# Patient Record
Sex: Female | Born: 1965 | Race: White | Hispanic: No | Marital: Married | Smoking: Never smoker
Health system: Southern US, Community
[De-identification: ages and names within clinical notes are randomized; demographics above are authoritative.]

## PROBLEM LIST (undated history)

## (undated) DIAGNOSIS — M1990 Unspecified osteoarthritis, unspecified site: Secondary | ICD-10-CM

## (undated) DIAGNOSIS — F419 Anxiety disorder, unspecified: Secondary | ICD-10-CM

## (undated) DIAGNOSIS — I10 Essential (primary) hypertension: Secondary | ICD-10-CM

## (undated) DIAGNOSIS — E785 Hyperlipidemia, unspecified: Secondary | ICD-10-CM

## (undated) DIAGNOSIS — I639 Cerebral infarction, unspecified: Secondary | ICD-10-CM

## (undated) DIAGNOSIS — E119 Type 2 diabetes mellitus without complications: Secondary | ICD-10-CM

## (undated) DIAGNOSIS — J45909 Unspecified asthma, uncomplicated: Secondary | ICD-10-CM

## (undated) DIAGNOSIS — T7840XA Allergy, unspecified, initial encounter: Secondary | ICD-10-CM

## (undated) DIAGNOSIS — F32A Depression, unspecified: Secondary | ICD-10-CM

## (undated) HISTORY — DX: Cerebral infarction, unspecified: I63.9

## (undated) HISTORY — DX: Allergy, unspecified, initial encounter: T78.40XA

## (undated) HISTORY — DX: Essential (primary) hypertension: I10

## (undated) HISTORY — PX: OTHER SURGICAL HISTORY: SHX169

## (undated) HISTORY — DX: Hyperlipidemia, unspecified: E78.5

## (undated) HISTORY — PX: WISDOM TOOTH EXTRACTION: SHX21

---
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
## 1998-02-25 ENCOUNTER — Other Ambulatory Visit: Admission: RE | Admit: 1998-02-25 | Discharge: 1998-02-25 | Payer: Self-pay | Admitting: Obstetrics & Gynecology

## 1998-03-13 ENCOUNTER — Ambulatory Visit (HOSPITAL_COMMUNITY): Admission: RE | Admit: 1998-03-13 | Discharge: 1998-03-13 | Payer: Self-pay | Admitting: Obstetrics and Gynecology

## 1998-03-22 ENCOUNTER — Encounter: Admission: RE | Admit: 1998-03-22 | Discharge: 1998-06-20 | Payer: Self-pay | Admitting: Obstetrics and Gynecology

## 1998-05-03 ENCOUNTER — Other Ambulatory Visit: Admission: RE | Admit: 1998-05-03 | Discharge: 1998-05-03 | Payer: Self-pay | Admitting: Obstetrics and Gynecology

## 1998-05-03 ENCOUNTER — Inpatient Hospital Stay (HOSPITAL_COMMUNITY): Admission: AD | Admit: 1998-05-03 | Discharge: 1998-05-03 | Payer: Self-pay | Admitting: Obstetrics and Gynecology

## 1998-05-13 ENCOUNTER — Inpatient Hospital Stay (HOSPITAL_COMMUNITY): Admission: AD | Admit: 1998-05-13 | Discharge: 1998-05-16 | Payer: Self-pay | Admitting: Obstetrics and Gynecology

## 1998-05-17 ENCOUNTER — Encounter (HOSPITAL_COMMUNITY): Admission: RE | Admit: 1998-05-17 | Discharge: 1998-08-15 | Payer: Self-pay | Admitting: Obstetrics and Gynecology

## 1999-01-07 ENCOUNTER — Other Ambulatory Visit: Admission: RE | Admit: 1999-01-07 | Discharge: 1999-01-07 | Payer: Self-pay | Admitting: Obstetrics and Gynecology

## 1999-12-30 ENCOUNTER — Other Ambulatory Visit: Admission: RE | Admit: 1999-12-30 | Discharge: 1999-12-30 | Payer: Self-pay | Admitting: Obstetrics and Gynecology

## 2001-04-13 ENCOUNTER — Other Ambulatory Visit: Admission: RE | Admit: 2001-04-13 | Discharge: 2001-04-13 | Payer: Self-pay | Admitting: Obstetrics and Gynecology

## 2002-03-28 ENCOUNTER — Other Ambulatory Visit: Admission: RE | Admit: 2002-03-28 | Discharge: 2002-03-28 | Payer: Self-pay | Admitting: Obstetrics and Gynecology

## 2002-05-22 ENCOUNTER — Ambulatory Visit (HOSPITAL_COMMUNITY): Admission: RE | Admit: 2002-05-22 | Discharge: 2002-05-22 | Payer: Self-pay | Admitting: Family Medicine

## 2002-05-22 ENCOUNTER — Encounter: Payer: Self-pay | Admitting: Family Medicine

## 2002-06-17 ENCOUNTER — Inpatient Hospital Stay (HOSPITAL_COMMUNITY): Admission: AD | Admit: 2002-06-17 | Discharge: 2002-06-17 | Payer: Self-pay | Admitting: Obstetrics and Gynecology

## 2002-07-14 ENCOUNTER — Inpatient Hospital Stay (HOSPITAL_COMMUNITY): Admission: AD | Admit: 2002-07-14 | Discharge: 2002-07-14 | Payer: Self-pay | Admitting: Obstetrics and Gynecology

## 2005-02-03 ENCOUNTER — Other Ambulatory Visit: Admission: RE | Admit: 2005-02-03 | Discharge: 2005-02-03 | Payer: Self-pay | Admitting: Obstetrics and Gynecology

## 2006-02-03 ENCOUNTER — Other Ambulatory Visit: Admission: RE | Admit: 2006-02-03 | Discharge: 2006-02-03 | Payer: Self-pay | Admitting: Obstetrics and Gynecology

## 2006-02-09 ENCOUNTER — Ambulatory Visit (HOSPITAL_COMMUNITY): Admission: RE | Admit: 2006-02-09 | Discharge: 2006-02-09 | Payer: Self-pay | Admitting: Obstetrics and Gynecology

## 2007-03-29 ENCOUNTER — Encounter: Admission: RE | Admit: 2007-03-29 | Discharge: 2007-06-27 | Payer: Self-pay | Admitting: Obstetrics and Gynecology

## 2007-07-28 ENCOUNTER — Inpatient Hospital Stay (HOSPITAL_COMMUNITY): Admission: AD | Admit: 2007-07-28 | Discharge: 2007-07-28 | Payer: Self-pay | Admitting: Obstetrics and Gynecology

## 2007-09-16 ENCOUNTER — Inpatient Hospital Stay (HOSPITAL_COMMUNITY): Admission: AD | Admit: 2007-09-16 | Discharge: 2007-09-16 | Payer: Self-pay | Admitting: Obstetrics and Gynecology

## 2007-09-17 ENCOUNTER — Inpatient Hospital Stay (HOSPITAL_COMMUNITY): Admission: AD | Admit: 2007-09-17 | Discharge: 2007-09-17 | Payer: Self-pay | Admitting: Obstetrics and Gynecology

## 2007-09-19 ENCOUNTER — Inpatient Hospital Stay (HOSPITAL_COMMUNITY): Admission: AD | Admit: 2007-09-19 | Discharge: 2007-09-20 | Payer: Self-pay | Admitting: Obstetrics and Gynecology

## 2007-09-27 ENCOUNTER — Inpatient Hospital Stay (HOSPITAL_COMMUNITY): Admission: AD | Admit: 2007-09-27 | Discharge: 2007-09-29 | Payer: Self-pay | Admitting: Obstetrics and Gynecology

## 2007-10-03 ENCOUNTER — Inpatient Hospital Stay (HOSPITAL_COMMUNITY): Admission: AD | Admit: 2007-10-03 | Discharge: 2007-10-04 | Payer: Self-pay | Admitting: Obstetrics and Gynecology

## 2007-11-11 ENCOUNTER — Ambulatory Visit (HOSPITAL_COMMUNITY): Admission: RE | Admit: 2007-11-11 | Discharge: 2007-11-11 | Payer: Self-pay | Admitting: Obstetrics and Gynecology

## 2009-08-04 DIAGNOSIS — Z820 Family history of epilepsy and other diseases of the nervous system: Secondary | ICD-10-CM | POA: Insufficient documentation

## 2011-04-14 NOTE — H&P (Signed)
NAME:  Jessica Ponce, STROLLO NO.:  [id number]   MEDICAL RECORD NO.:  [id number]          PATIENT TYPE:  INP   LOCATION:  9173                          FACILITY:  [facility]   PHYSICIAN:  Osborn Coho, M.D.   DATE OF BIRTH:  [birth year]   DATE OF ADMISSION:  09/27/2007  DATE OF DISCHARGE:                              HISTORY & PHYSICAL   This is a 45 year old, gravida 2, para 1, at 38-4/7 weeks who presents  for evaluation secondary to high blood pressures in the office with a  blood pressure reading of 148/798.  The patient also reports a headache  that started yesterday.  She denies visual changes or abdominal pain,  does report occasional contractions.  A 24-hour urine and workup was  normal and Friday.  Pregnancy has been followed by Dr. Stefano Gaul and  remarkable for:  1. AMA.  2. History of infertility.  3. Gestational diabetes.  4. History of migraines.  5. History of asthma.  6. Obesity.  7. History of preeclampsia.   ALLERGIES:  None.   OB HISTORY:  Is remarkable for a vaginal delivery in 1999 of a female  infant at [redacted] weeks gestation, weighing 6 pounds 1 ounce. Remarkable for  preeclampsia.   MEDICAL HISTORY:  Is remarkable for:  1. Anemia in the past  2. Gestational diabetes with previous and current pregnancy.  3. History of preeclampsia with previous and current pregnancy.  4. History of preterm labor in the past pregnancy.  5. History of infertility with previous pregnancies.  6. Childhood varicella.  7. Asthma with rare use of inhaler.  8. History of migraines.   SURGICAL HISTORY:  Is remarkable for:  1. Appendectomy in 1984.  2. Repair  in 1985.   FAMILY HISTORY:  Is remarkable for father with hypertension, grandmother  with emphysema, grandmother with breast cancer, father with seizures.   GENETIC HISTORY:  Is remarkable for the patient's age of 2 and first  cousin with hemophilia.   SOCIAL HISTORY:  The patient is married to Lannette Donath who is  involved  and supportive.  She is of [place] faith.  She denies any alcohol,  tobacco or drug use.   PRENATAL LABORATORY DATA:  Hemoglobin 12.7, platelets 314.  Blood type O  positive, antibody screen negative.  Toxin negative, RPR negative,  rubella immune.  Hepatitis negative, HIV negative.  Cystic fibrosis  declined.   HISTORY OF CURRENT PREGNANCY:  The patient entered care at [redacted] weeks  gestation.  She had a 3-hour GTT done early that showed gestational  diabetes, and she was then sent to [facility] for  diabetic teaching.  She declined amniocentesis for genetics. She had an  ultrasound at 14 weeks for dates, and it was normal.  She was begun on  insulin at 14 weeks, and she was treated for asthma at 15 weeks. She had  ultrasound at 19 weeks which was  normal. Insulin was increased several  times throughout the pregnancy due to elevated blood sugars, and she had  a growth ultrasound and 29 weeks showing 79th  percentile growth. At that  point, she was on 12 units of NPH insulin in the morning, 6 units at  dinner, and 40 units at night. She began weekly NSTs at 30 weeks.  She  was seen for preterm contractions at 30 weeks.  She had a positive group  B strep at that time.  She had a UTI at 31 weeks and was treated for  that.  She had an asthma flare and 34 weeks and had to use her inhaler  and Singulair more frequently. She continued to have erratic blood  sugars, and insulin was increased as indicated, and she began having  some elevated blood pressures at 35 weeks and then presented today with  blood pressure elevation.   OBJECTIVE:  VITAL SIGNS:  One reading of 165/104, other readings are  130s to 140s over 92-94. PIH labs were all within normal limits.  Urine  was negative for protein.   Ultrasound in the office showed 8 pounds 5 ounces which is 85th  percentile growth. BPP was 8/8, and the AFI was 25 which was 95th  percentile.  NST shows a reactive fetal  heart rate with intermittent  contractions.  Cervix is 3 cm, 50%  effaced, and ballottable.  Her blood  sugar is pending.   ASSESSMENT:  1. Intrauterine pregnancy at 38-4/7 weeks.  2. Pregnancy-induced hypertension.  3. Type 2 diabetes on insulin.  4. Requests elective induction of labor.   PLAN:  1. Admit to birthing suites per Dr. Su Hilt after discussion of risks      and benefits of induction of labor.  2. Glucomander.  3. Pitocin augmentation of labor.  4. Further orders to follow.      Marie L. Williams, C.N.M.      Osborn Coho, M.D.  Electronically Signed    MLW/MEDQ  D:  09/27/2007  T:  09/27/2007  Job:  [id number]

## 2011-04-14 NOTE — Discharge Summary (Signed)
Jessica Ponce, Jessica Ponce                 ACCOUNT NO.:  [id number]   MEDICAL RECORD NO.:  [id number]          PATIENT TYPE:  INP   LOCATION:  9126                          FACILITY:  [facility]   PHYSICIAN:  Osborn Coho, M.D.   DATE OF BIRTH:  05/25/1966   DATE OF ADMISSION:  09/27/2007  DATE OF DISCHARGE:  09/29/2007                               DISCHARGE SUMMARY   ADMISSION DIAGNOSES:  1. Intrauterine pregnancy at 58 and 4/7th weeks.  2. Pregnancy-induced hypertension (PIH).  3. Type 2 diabetes requiring insulin.   DISCHARGE DIAGNOSES:  1. Intrauterine pregnancy at 76 and 4/7th weeks.  2. Pregnancy-induced hypertension (PIH).  3. Type 2 diabetes requiring insulin.  4. Status post spontaneous vaginal delivery of a female infant named      Jessica Ponce, Apgar's 8 and 9, weighing 8 pounds 1 ounce.  5. Bilateral periurethral abrasions.   HOSPITAL PROCEDURES:  1. Electronic fetal monitoring.  2. Pitocin augmentation of labor.  3. Epidural anesthesia.  4. Internal fetal monitors.  5. Spontaneous vaginal delivery.   HOSPITAL COURSE:  The patient was admitted in early labor at 3 cm with  pregnancy-induced hypertension with no evidence of preeclampsia.  She  also had insulin-dependent diabetes requiring increased insulin doses  towards the end of the pregnancy.  She had her labor augmented with  Pitocin, IUPC was placed, and amniotomy was performed.  Her labor  progressed quickly to vaginal delivery of a female infant named Jessica Ponce,  Apgar's 8 and 9, weighing 8 pounds 1 ounce.  EBL was 500 ml.  Periurethral abrasions were not repaired.  She was taken to the Mother-  Baby Floor where she did well.  On postpartum day #1, blood pressures  were 135 to 158/86 to 94.  Fasting blood sugar of 83, two-hour PC blood  sugar 176, and another postprandial blood sugar 160.  She was given  insulin accordingly.  Hemoglobin 10.6, and PIH labs were normal.  She  was given routine care.  On postpartum day #2, her  fasting blood sugar  was 99, two-hour postprandials were 230, 207, and 160.  She was  breastfeeding well.  Vital signs were stable.  Blood pressures were 127  to 134/71 to 85 without medication.  Chest was clear, heart was regular  rate and rhythm, fundus was firm, lochia small, perineum was healing,  extremities showed a trace to 1+ edema.  She also had a couple of hand  lesions on her right hand that were papular, raised, but not  erythematous, and she was switched to Glyburide for blood sugar control,  and she was deemed to have received full benefit of her hospital stay  and was discharged home.   DISCHARGE MEDICATIONS:  1. Motrin 600 mg p.o. q.6h p.r.n.  2. Tylox one to two p.o. q.4h p.r.n.  3. Glyburide 2.5 mg daily.   DISCHARGE LABORATORY DATA:  White blood cell count 17.2, hemoglobin  10.6, platelets 256.  Sodium 136, potassium 3.7, creatinine 0.64.  AST  26, ALT 14.  Uric acid 5.1.  LDH 173.   DISCHARGE INSTRUCTIONS:  Per [facility]  handout with the addition of checking  fasting blood sugar every day and two hours after eating alternating  between breakfast, lunch, and dinner once per day.   DISCHARGE FOLLOWUP:  In two weeks at [facility].   CONDITION AT DISCHARGE:  Good.      Marie L. Williams, C.N.M.      Osborn Coho, M.D.  Electronically Signed    MLW/MEDQ  D:  09/29/2007  T:  09/29/2007  Job:  [id number]

## 2011-09-08 LAB — COMPREHENSIVE METABOLIC PANEL
ALT: 32
AST: 21
Albumin: 2.8 — ABNORMAL LOW
Alkaline Phosphatase: 99
BUN: 10
CO2: 19
Calcium: 9.1
Chloride: 105
Creatinine, Ser: 0.84
GFR calc Af Amer: >60
GFR calc non Af Amer: >60
Glucose, Bld: 174 — ABNORMAL HIGH
Potassium: 3.9
Sodium: 138
Total Bilirubin: 0.4
Total Protein: 6.1

## 2011-09-08 LAB — URINALYSIS, ROUTINE W REFLEX MICROSCOPIC
Bilirubin Urine: NEGATIVE
Glucose, UA: NEGATIVE
Hgb urine dipstick: NEGATIVE
Ketones, ur: 15 — AB
Leukocytes, UA: NEGATIVE
Nitrite: NEGATIVE
Protein, ur: 30 — AB
Specific Gravity, Urine: >1.03 — ABNORMAL HIGH
Urobilinogen, UA: 0.2
pH: 6

## 2011-09-08 LAB — URINE MICROSCOPIC-ADD ON: RBC / HPF: NONE SEEN

## 2011-09-08 LAB — CBC
HCT: 32 — ABNORMAL LOW
Hemoglobin: 11.1 — ABNORMAL LOW
MCHC: 34.7
MCV: 84
Platelets: 420 — ABNORMAL HIGH
RBC: 3.81 — ABNORMAL LOW
RDW: 14.6 — ABNORMAL HIGH
WBC: 17.7 — ABNORMAL HIGH

## 2011-09-08 LAB — URIC ACID: Uric Acid, Serum: 5.3

## 2011-09-08 LAB — LACTATE DEHYDROGENASE: LDH: 192

## 2011-09-09 LAB — CBC
HCT: 30.2 — ABNORMAL LOW
HCT: 31.4 — ABNORMAL LOW
HCT: 33.2 — ABNORMAL LOW
Hemoglobin: 10.6 — ABNORMAL LOW
Hemoglobin: 10.9 — ABNORMAL LOW
Hemoglobin: 11.7 — ABNORMAL LOW
MCHC: 34.8
MCHC: 35.2
MCHC: 35.2
MCV: 83.5
MCV: 83.6
MCV: 83.9
Platelets: 256
Platelets: 270
Platelets: 271
RBC: 3.62 — ABNORMAL LOW
RBC: 3.75 — ABNORMAL LOW
RBC: 3.97
RDW: 14.6 — ABNORMAL HIGH
RDW: 14.8 — ABNORMAL HIGH
RDW: 14.9 — ABNORMAL HIGH
WBC: 13.3 — ABNORMAL HIGH
WBC: 14 — ABNORMAL HIGH
WBC: 17.2 — ABNORMAL HIGH

## 2011-09-09 LAB — URINALYSIS, ROUTINE W REFLEX MICROSCOPIC
Bilirubin Urine: NEGATIVE
Bilirubin Urine: NEGATIVE
Glucose, UA: NEGATIVE
Glucose, UA: NEGATIVE
Hgb urine dipstick: NEGATIVE
Hgb urine dipstick: NEGATIVE
Ketones, ur: NEGATIVE
Ketones, ur: NEGATIVE
Nitrite: NEGATIVE
Nitrite: NEGATIVE
Protein, ur: NEGATIVE
Protein, ur: NEGATIVE
Specific Gravity, Urine: 1.01
Specific Gravity, Urine: 1.015
Urobilinogen, UA: 0.2
Urobilinogen, UA: 0.2
pH: 5.5
pH: 6

## 2011-09-09 LAB — COMPREHENSIVE METABOLIC PANEL WITH GFR
ALT: 13
ALT: 14
ALT: 15
AST: 16
AST: 18
AST: 26
Albumin: 2 — ABNORMAL LOW
Albumin: 2.4 — ABNORMAL LOW
Albumin: 2.6 — ABNORMAL LOW
Alkaline Phosphatase: 103
Alkaline Phosphatase: 81
Alkaline Phosphatase: 97
BUN: 6
BUN: 6
BUN: 7
CO2: 20
CO2: 20
CO2: 22
Calcium: 8.6
Calcium: 8.7
Calcium: 9.6
Chloride: 107
Chloride: 108
Chloride: 108
Creatinine, Ser: 0.53
Creatinine, Ser: 0.54
Creatinine, Ser: 0.64
GFR calc non Af Amer: >60
GFR calc non Af Amer: >60
GFR calc non Af Amer: >60
Glucose, Bld: 114 — ABNORMAL HIGH
Glucose, Bld: 74
Glucose, Bld: 88
Potassium: 3.7
Potassium: 4
Potassium: 4
Sodium: 134 — ABNORMAL LOW
Sodium: 136
Sodium: 136
Total Bilirubin: 0.4
Total Bilirubin: 0.5
Total Bilirubin: 0.6
Total Protein: 4.9 — ABNORMAL LOW
Total Protein: 5.5 — ABNORMAL LOW
Total Protein: 6

## 2011-09-09 LAB — CREATININE CLEARANCE, URINE, 24 HOUR
Collection Interval-CRCL: 24
Creatinine Clearance: 153 — ABNORMAL HIGH
Creatinine, 24H Ur: 1167
Creatinine, Urine: 103.7
Creatinine: 0.53
Urine Total Volume-CRCL: 1125

## 2011-09-09 LAB — PROTEIN, URINE, 24 HOUR
Collection Interval-UPROT: 24
Protein, 24H Urine: 146 — ABNORMAL HIGH
Protein, Urine: 13
Urine Total Volume-UPROT: 1125

## 2011-09-09 LAB — RPR: RPR Ser Ql: NONREACTIVE

## 2011-09-09 LAB — LACTATE DEHYDROGENASE
LDH: 117
LDH: 124
LDH: 173

## 2011-09-09 LAB — URIC ACID
Uric Acid, Serum: 4.7
Uric Acid, Serum: 5
Uric Acid, Serum: 5.1

## 2011-09-09 LAB — GLUCOSE, RANDOM: Glucose, Bld: 150 — ABNORMAL HIGH

## 2011-09-11 LAB — URINALYSIS, ROUTINE W REFLEX MICROSCOPIC
Bilirubin Urine: NEGATIVE
Glucose, UA: NEGATIVE
Hgb urine dipstick: NEGATIVE
Ketones, ur: 15 — AB
Nitrite: NEGATIVE
Protein, ur: NEGATIVE
Specific Gravity, Urine: <1.005 — ABNORMAL LOW
Urobilinogen, UA: 0.2
pH: 6

## 2011-09-11 LAB — WET PREP, GENITAL
Clue Cells Wet Prep HPF POC: NONE SEEN
Trich, Wet Prep: NONE SEEN
Yeast Wet Prep HPF POC: NONE SEEN

## 2011-09-11 LAB — GC/CHLAMYDIA PROBE AMP, GENITAL
Chlamydia, DNA Probe: NEGATIVE
GC Probe Amp, Genital: NEGATIVE

## 2011-09-11 LAB — FETAL FIBRONECTIN: Fetal Fibronectin: NEGATIVE

## 2012-07-20 ENCOUNTER — Ambulatory Visit: Payer: Managed Care, Other (non HMO) | Admitting: Family Medicine

## 2012-07-20 ENCOUNTER — Ambulatory Visit: Payer: Managed Care, Other (non HMO)

## 2012-07-20 VITALS — BP 143/84 | HR 86 | Temp 98.3°F | Resp 18 | Ht 64.5 in | Wt 206.0 lb

## 2012-07-20 DIAGNOSIS — J45909 Unspecified asthma, uncomplicated: Secondary | ICD-10-CM

## 2012-07-20 DIAGNOSIS — R05 Cough: Secondary | ICD-10-CM

## 2012-07-20 DIAGNOSIS — R059 Cough, unspecified: Secondary | ICD-10-CM

## 2012-07-20 DIAGNOSIS — J189 Pneumonia, unspecified organism: Secondary | ICD-10-CM

## 2012-07-20 LAB — POCT CBC
Granulocyte percent: 73.3 %G (ref 37–80)
HCT, POC: 37.8 % (ref 37.7–47.9)
Hemoglobin: 11.8 g/dL — AB (ref 12.2–16.2)
Lymph, poc: 3.6 — AB (ref 0.6–3.4)
MCH, POC: 28.6 pg (ref 27–31.2)
MCHC: 31.2 g/dL — AB (ref 31.8–35.4)
MCV: 91.7 fL (ref 80–97)
MID (cbc): 0.8 (ref 0–0.9)
MPV: 10.5 fL (ref 0–99.8)
POC Granulocyte: 12.1 — AB (ref 2–6.9)
POC LYMPH PERCENT: 21.8 %L (ref 10–50)
POC MID %: 4.9 %M (ref 0–12)
Platelet Count, POC: 288 10*3/uL (ref 142–424)
RBC: 4.12 M/uL (ref 4.04–5.48)
RDW, POC: 13.1 %
WBC: 16.5 10*3/uL — AB (ref 4.6–10.2)

## 2012-07-20 MED ORDER — IPRATROPIUM BROMIDE 0.02 % IN SOLN
0.5000 mg | Freq: Once | RESPIRATORY_TRACT | Status: AC
  Administered 2012-07-20: 0.5 mg via RESPIRATORY_TRACT

## 2012-07-20 MED ORDER — AZITHROMYCIN 500 MG PO TABS: 500.0000 mg | ORAL_TABLET | Freq: Every day | ORAL | Status: AC

## 2012-07-20 MED ORDER — CEFTRIAXONE SODIUM 1 G IJ SOLR
1.0000 g | Freq: Once | INTRAMUSCULAR | Status: AC
  Administered 2012-07-20: 1 g via INTRAMUSCULAR

## 2012-07-20 MED ORDER — ALBUTEROL SULFATE (2.5 MG/3ML) 0.083% IN NEBU
2.5000 mg | INHALATION_SOLUTION | Freq: Once | RESPIRATORY_TRACT | Status: AC
  Administered 2012-07-20: 2.5 mg via RESPIRATORY_TRACT

## 2012-07-20 MED ORDER — PREDNISONE 50 MG PO TABS: ORAL_TABLET | ORAL | Status: AC

## 2012-07-20 NOTE — Assessment & Plan Note (Signed)
Patient does have pneumonia. This is seen clinically as well as with radiological imaging. At this point with her elevated white blood cell count in history of asthma we'll treat for community-acquired pneumonia as well as covering for atypicals. Patient will have his shot of ceftriaxone 1 g today and do azithromycin for 3 days. Patient given a prescription for prednisone that she can start in the next 48 hours if she feels like she's not improving. I'm concerned though that patient may have some postinflammatory changes at this pneumonia so that is the reason that the prednisone was given. Patient will followup in one week time if not improving.

## 2012-07-20 NOTE — Progress Notes (Signed)
  Subjective:    Patient ID: Jessica Ponce, female    DOB: 1966/06/18, 46 y.o.   MRN: [id number]  HPI 46 year old female coming in with 4 week history of cough as well as some shortness of breath. Patient does have a past medical history significant for asthma. Patient has been using her albuterol inhaler from time to time but states that now for the course of last week it does not feel like her asthma. Patient states that her asthma has been well controlled but now has shortness of breath as well as a productive cough that is yellow phlegm. Patient denies any fevers or chills denies any records. Patient though states starting tonight she started to feel a little more not like herself. Patient denies any recent travel history denies any changes in medicine. Patient is supposed to be on Advair but is not taking it because her asthma has been well-controlled.  Past medical surgical and family history reviewed without any changes. Review of Systems As stated in history of present illness otherwise negative.    Objective:   Physical Exam Filed Vitals:   07/20/12 1850  BP: 143/84  Pulse: 86  Temp: 98.3 F (36.8 C)  Resp: 18   General: No apparent distress HEENT: Pupils are equal round reactive to light, extra ocular movements intact, patient does have some mild erythema of the posterior pharynx Cardiovascular: Regular rate and rhythm no murmur Respiratory: Patient does have some mild scattered wheezes otherwise unremarkable. Abdomen: Bowel sounds positive nontender nondistended Extremities: Patient neurovascularly intact no swelling of the extremities.  Lab Results  Component Value Date   WBC 16.5* 07/20/2012   HGB 11.8* 07/20/2012   HCT 37.8 07/20/2012   MCV 91.7 07/20/2012   PLT 420 DELTA CHECK NOTED* 10/03/2007   Patient had a chest x-ray ordered and interpreted by me today. Patient does have some patchy infiltrate seen in the right middle lobe. This is seen both in AP as well as the lateral  views. Otherwise cardiac silhouette is normal size diaphragms are minorly flattened.      Assessment & Plan:  Pneumonia

## 2012-07-20 NOTE — Patient Instructions (Signed)
Very nice to meet you. You do have what appears to be a pneumonia. We are going to give you a shot of antibiotic as well as daily;. In addition to this going to give you a prescription for prednisone to have on hand. You can fill this if you want. If he gets fever, chills, worsening shortness of breath please seek medical attention immediately. You probably should be seen again in one week to make sure you're doing better. If you were doing great than at least call and tell us you are doing well. Marland Kitchen

## 2012-07-23 NOTE — Progress Notes (Signed)
Xray read and patient discussed with Dr. Smith. Agree with assessment and plan of care per his note.   

## 2012-08-02 ENCOUNTER — Ambulatory Visit: Payer: Managed Care, Other (non HMO)

## 2012-08-02 ENCOUNTER — Ambulatory Visit (INDEPENDENT_AMBULATORY_CARE_PROVIDER_SITE_OTHER): Payer: Managed Care, Other (non HMO) | Admitting: Family Medicine

## 2012-08-02 ENCOUNTER — Ambulatory Visit (HOSPITAL_COMMUNITY)
Admission: RE | Admit: 2012-08-02 | Discharge: 2012-08-02 | Disposition: A | Discharge status: Home / Self Care | Payer: Managed Care, Other (non HMO) | Source: Ambulatory Visit | Attending: Family Medicine | Admitting: Family Medicine

## 2012-08-02 ENCOUNTER — Telehealth: Payer: Self-pay

## 2012-08-02 VITALS — HR 77 | Temp 98.5°F | Resp 16 | Ht 65.0 in | Wt 207.0 lb

## 2012-08-02 DIAGNOSIS — M7989 Other specified soft tissue disorders: Secondary | ICD-10-CM

## 2012-08-02 DIAGNOSIS — R079 Chest pain, unspecified: Secondary | ICD-10-CM

## 2012-08-02 DIAGNOSIS — R600 Localized edema: Secondary | ICD-10-CM

## 2012-08-02 DIAGNOSIS — R609 Edema, unspecified: Secondary | ICD-10-CM | POA: Insufficient documentation

## 2012-08-02 DIAGNOSIS — R0602 Shortness of breath: Secondary | ICD-10-CM | POA: Insufficient documentation

## 2012-08-02 DIAGNOSIS — J189 Pneumonia, unspecified organism: Secondary | ICD-10-CM

## 2012-08-02 DIAGNOSIS — R0789 Other chest pain: Secondary | ICD-10-CM | POA: Insufficient documentation

## 2012-08-02 DIAGNOSIS — M79609 Pain in unspecified limb: Secondary | ICD-10-CM

## 2012-08-02 DIAGNOSIS — R509 Fever, unspecified: Secondary | ICD-10-CM | POA: Insufficient documentation

## 2012-08-02 LAB — POCT CBC
Granulocyte percent: 63.1 %G (ref 37–80)
HCT, POC: 44.2 % (ref 37.7–47.9)
Hemoglobin: 14 g/dL (ref 12.2–16.2)
Lymph, poc: 3.5 — AB (ref 0.6–3.4)
MCH, POC: 28.7 pg (ref 27–31.2)
MCHC: 31.7 g/dL — AB (ref 31.8–35.4)
MCV: 90.8 fL (ref 80–97)
MID (cbc): 0.6 (ref 0–0.9)
MPV: 9.5 fL (ref 0–99.8)
POC Granulocyte: 7 — AB (ref 2–6.9)
POC LYMPH PERCENT: 31.2 %L (ref 10–50)
POC MID %: 5.7 %M (ref 0–12)
Platelet Count, POC: 327 10*3/uL (ref 142–424)
RBC: 4.87 M/uL (ref 4.04–5.48)
RDW, POC: 13.3 %
WBC: 11.1 10*3/uL — AB (ref 4.6–10.2)

## 2012-08-02 NOTE — Telephone Encounter (Signed)
Spoke with pt and advised Doppler was normal. She wanted to see if DrLlauenstein was still going to give her an ABX for her elevated white count. She also wanted to know if she should proceed with the CT chest? Please advise

## 2012-08-02 NOTE — Addendum Note (Signed)
Addended by: Thelma Barge D on: 08/02/2012 03:01 PM   Modules accepted: Orders

## 2012-08-02 NOTE — Telephone Encounter (Signed)
Call report: LE doppler NEGATIVE for DVT.  Patient was evaluated by Dr. Milus Glazier earlier today with chest pain. She was sent for CT angiogram of the chest to rule out PE. While there, she developed right lower extremity swelling and was advised to have a scan of her leg, which was ordered.  Please advise the patient that the scan of the leg was NEGATIVE for DVT.  I'm also including Dr. Milus Glazier on the message as an FYI.  This was copied from a message by Tilden Fossa that was put into another pt's chart in error. I have documented in the other chart that the message was put in in error.

## 2012-08-02 NOTE — Progress Notes (Signed)
*  PRELIMINARY RESULTS* Vascular Ultrasound Right lower extremity venous duplex has been completed.  Preliminary findings: no evidence of DVT or baker's cyst.  Called report to Mateo Flow at Dr. Loma Boston office.  Farrel Demark, RDMS, RVT  08/02/2012, 4:11 PM

## 2012-08-02 NOTE — Progress Notes (Signed)
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   She is here for a followup of pneumonia.  She was treated with zithromycin and ceftriaxone on 8/24 after a 4 week hx of coughing.  She never took the prednisone. She has right chest ache and some shortness of breath, but minimal cough.  She developed a fever last night and c/o fatigue as well PMHx:  H/o asthma  Sig Negs:  No leg pains, no acute pleuritic pains  Objective: NAD HEENT:  Unremarkable Neck: supple, no adenopathy Chest: few rales Heart:  Reg, no murmur  [facility] reading (PRIMARY) by  Dr. Milus Glazier. CXR: no infiltrate or cardiomegaly Results for orders placed in visit on 08/02/12  POCT CBC      Component Value Range   WBC 11.1 (*) 4.6 - 10.2 K/uL   Lymph, poc 3.5 (*) 0.6 - 3.4   POC LYMPH PERCENT 31.2  10 - 50 %L   MID (cbc) 0.6  0 - 0.9   POC MID % 5.7  0 - 12 %M   POC Granulocyte 7.0 (*) 2 - 6.9   Granulocyte percent 63.1  37 - 80 %G   RBC 4.87  4.04 - 5.48 M/uL   Hemoglobin 14.0  12.2 - 16.2 g/dL   HCT, POC 40.9  81.1 - 47.9 %   MCV 90.8  80 - 97 fL   MCH, POC 28.7  27 - 31.2 pg   MCHC 31.7 (*) 31.8 - 35.4 g/dL   RDW, POC 91.4     Platelet Count, POC 327  142 - 424 K/uL   MPV 9.5  0 - 99.8 fL   Assessment:  Atypical pulmonary symptoms that could be a pulmonary embolus, given the persistent elevated WBC yet normal CXR.  Plan: chest CT with contrast today.

## 2012-08-03 NOTE — Telephone Encounter (Signed)
Please call in levaquin 500 qd x 7 days

## 2012-08-04 MED ORDER — LEVOFLOXACIN 500 MG PO TABS: 500.0000 mg | ORAL_TABLET | Freq: Every day | ORAL | Status: AC

## 2012-08-04 NOTE — Telephone Encounter (Signed)
Patient advised this is called in for her, looks like she also has ? About a CT chest, she will call me about this.

## 2013-01-17 ENCOUNTER — Ambulatory Visit: Payer: Self-pay | Admitting: Obstetrics and Gynecology

## 2013-02-10 ENCOUNTER — Ambulatory Visit: Payer: Self-pay | Admitting: Obstetrics and Gynecology

## 2013-03-01 ENCOUNTER — Ambulatory Visit: Payer: Self-pay | Admitting: Obstetrics and Gynecology

## 2013-11-03 ENCOUNTER — Encounter (HOSPITAL_COMMUNITY): Payer: Self-pay | Admitting: Emergency Medicine

## 2013-11-03 ENCOUNTER — Emergency Department (HOSPITAL_COMMUNITY)
Admission: EM | Admit: 2013-11-03 | Discharge: 2013-11-03 | Disposition: A | Discharge status: Home / Self Care | Payer: Managed Care, Other (non HMO) | Source: Home / Self Care | Attending: Family Medicine | Admitting: Family Medicine

## 2013-11-03 ENCOUNTER — Emergency Department (INDEPENDENT_AMBULATORY_CARE_PROVIDER_SITE_OTHER): Payer: Managed Care, Other (non HMO)

## 2013-11-03 DIAGNOSIS — IMO0002 Reserved for concepts with insufficient information to code with codable children: Secondary | ICD-10-CM

## 2013-11-03 DIAGNOSIS — W268XXA Contact with other sharp object(s), not elsewhere classified, initial encounter: Secondary | ICD-10-CM

## 2013-11-03 DIAGNOSIS — S90852A Superficial foreign body, left foot, initial encounter: Secondary | ICD-10-CM

## 2013-11-03 HISTORY — DX: Unspecified asthma, uncomplicated: J45.909

## 2013-11-03 HISTORY — DX: Type 2 diabetes mellitus without complications: E11.9

## 2013-11-03 NOTE — ED Notes (Signed)
Reports having a piece of glass in left foot.  Accidentally stepped on a broken bulb.  Husband attempted to remove glass, was unsuccessful.

## 2013-11-03 NOTE — ED Provider Notes (Addendum)
CSN: [id number]     Arrival date & time 11/03/13  1121 History   First MD Initiated Contact with Patient 11/03/13 1150     Chief Complaint  Patient presents with  . Foreign Body   (Consider location/radiation/quality/duration/timing/severity/associated sxs/prior Treatment) Patient is a 47 y.o. female presenting with foreign body. The history is provided by the patient.  Foreign Body Location:  Skin (left foot.) Suspected object:  Glass Pain quality:  Sharp Pain severity:  Mild Duration:  2 hours Progression:  Unchanged Chronicity:  New Risk factors comment:  Steppedon broken xmas ornament on floor this am.   Past Medical History  Diagnosis Date  . Asthma   . Diabetes mellitus without complication    Past Surgical History  Procedure Laterality Date  . Appendectomy     History reviewed. No pertinent family history. History  Substance Use Topics  . Smoking status: Never Smoker   . Smokeless tobacco: Not on file  . Alcohol Use: No   OB History   Grav Para Term Preterm Abortions TAB SAB Ect Mult Living                 Review of Systems  Musculoskeletal: Negative for joint swelling.  Skin: Positive for wound.    Allergies  Review of patient's allergies indicates no known allergies.  Home Medications   Current Outpatient Rx  Name  Route  Sig  Dispense  Refill  . ALBUTEROL IN   Inhalation   Inhale into the lungs.         . Fluticasone-Salmeterol (ADVAIR HFA IN)   Inhalation   Inhale into the lungs.         Marland Kitchen loratadine (CLARITIN) 10 MG tablet   Oral   Take 10 mg by mouth daily.         . montelukast (SINGULAIR) 10 MG tablet   Oral   Take 10 mg by mouth at bedtime.          BP 157/94  Pulse 82  Temp(Src) 98.5 F (36.9 C) (Oral)  Resp 16  SpO2 98%  LMP 10/23/2013 Physical Exam  Nursing note and vitals reviewed. Constitutional: She is oriented to person, place, and time. She appears well-developed and well-nourished.  Musculoskeletal: She  exhibits tenderness.       Feet:  Neurological: She is alert and oriented to person, place, and time.  Skin: Skin is warm and dry.    ED Course  FOREIGN BODY REMOVAL Date/Time: 11/03/2013 1:18 PM Performed by: Linna Hoff Authorized by: Bradd Canary D Consent: Verbal consent obtained. Risks and benefits: risks, benefits and alternatives were discussed Consent given by: patient Body area: skin General location: lower extremity Location details: left foot Local anesthetic: lidocaine 2% without epinephrine Patient sedated: no Patient restrained: no Patient cooperative: yes Localization method: probed Removal mechanism: forceps Dressing: dressing applied Tendon involvement: none Depth: subcutaneous Complexity: simple 1 objects recovered. Objects recovered: glass frag. Post-procedure assessment: foreign body removed Patient tolerance: Patient tolerated the procedure well with no immediate complications.   (including critical care time) Labs Review Labs Reviewed - No data to display Imaging Review Dg Foot Complete Left  11/03/2013   CLINICAL DATA:  Evaluate for radiopaque foreign body  EXAM: LEFT FOOT - COMPLETE 3+ VIEW  COMPARISON:  None.  FINDINGS: There is no evidence of fracture or dislocation. There is no evidence of arthropathy or other focal bone abnormality. Soft tissues are unremarkable. There is no evidence of a radiopaque foreign body within  the soft tissues.  IMPRESSION: Negative.   Electronically Signed   By: Salome Holmes M.D.   On: 11/03/2013 12:37    EKG Interpretation    Date/Time:    Ventricular Rate:    PR Interval:    QRS Duration:   QT Interval:    QTC Calculation:   R Axis:     Text Interpretation:              MDM  X-rays reviewed and report per radiologist. F.b successfully removed from foot.     Linna Hoff, MD 11/03/13 1322  Linna Hoff, MD 11/03/13 1324

## 2014-08-10 ENCOUNTER — Emergency Department (HOSPITAL_COMMUNITY)
Admission: EM | Admit: 2014-08-10 | Discharge: 2014-08-10 | Disposition: A | Discharge status: Home / Self Care | Payer: 59 | Attending: Emergency Medicine | Admitting: Emergency Medicine

## 2014-08-10 ENCOUNTER — Encounter (HOSPITAL_COMMUNITY): Payer: Self-pay | Admitting: Emergency Medicine

## 2014-08-10 ENCOUNTER — Emergency Department (HOSPITAL_COMMUNITY): Payer: 59

## 2014-08-10 ENCOUNTER — Emergency Department (HOSPITAL_COMMUNITY)
Admission: EM | Admit: 2014-08-10 | Discharge: 2014-08-10 | Disposition: A | Discharge status: Other Acute Inpatient Hospital | Payer: 59 | Source: Home / Self Care | Attending: Emergency Medicine | Admitting: Emergency Medicine

## 2014-08-10 DIAGNOSIS — Z79899 Other long term (current) drug therapy: Secondary | ICD-10-CM | POA: Diagnosis not present

## 2014-08-10 DIAGNOSIS — E119 Type 2 diabetes mellitus without complications: Secondary | ICD-10-CM | POA: Diagnosis not present

## 2014-08-10 DIAGNOSIS — R079 Chest pain, unspecified: Secondary | ICD-10-CM | POA: Diagnosis present

## 2014-08-10 DIAGNOSIS — J45909 Unspecified asthma, uncomplicated: Secondary | ICD-10-CM | POA: Diagnosis not present

## 2014-08-10 DIAGNOSIS — I10 Essential (primary) hypertension: Secondary | ICD-10-CM

## 2014-08-10 DIAGNOSIS — E669 Obesity, unspecified: Secondary | ICD-10-CM | POA: Insufficient documentation

## 2014-08-10 LAB — CBC
HCT: 38.2 % (ref 36.0–46.0)
Hemoglobin: 13.3 g/dL (ref 12.0–15.0)
MCH: 30 pg (ref 26.0–34.0)
MCHC: 34.8 g/dL (ref 30.0–36.0)
MCV: 86 fL (ref 78.0–100.0)
Platelets: 277 10*3/uL (ref 150–400)
RBC: 4.44 MIL/uL (ref 3.87–5.11)
RDW: 12.3 % (ref 11.5–15.5)
WBC: 10.9 10*3/uL — ABNORMAL HIGH (ref 4.0–10.5)

## 2014-08-10 LAB — BASIC METABOLIC PANEL
Anion gap: 12 (ref 5–15)
BUN: 8 mg/dL (ref 6–23)
CO2: 24 mEq/L (ref 19–32)
Calcium: 8.9 mg/dL (ref 8.4–10.5)
Chloride: 104 mEq/L (ref 96–112)
Creatinine, Ser: 0.68 mg/dL (ref 0.50–1.10)
GFR calc Af Amer: >90 mL/min (ref >90)
GFR calc non Af Amer: >90 mL/min (ref >90)
Glucose, Bld: 150 mg/dL — ABNORMAL HIGH (ref 70–99)
Potassium: 4.1 mEq/L (ref 3.7–5.3)
Sodium: 140 mEq/L (ref 137–147)

## 2014-08-10 LAB — I-STAT TROPONIN, ED: Troponin i, poc: 0 ng/mL (ref 0.00–0.08)

## 2014-08-10 MED ORDER — NITROGLYCERIN 0.4 MG SL SUBL: 0.4000 mg | SUBLINGUAL_TABLET | SUBLINGUAL | Status: DC | PRN

## 2014-08-10 MED ORDER — ASPIRIN 81 MG PO CHEW
324.0000 mg | CHEWABLE_TABLET | Freq: Once | ORAL | Status: AC
  Administered 2014-08-10: 324 mg via ORAL

## 2014-08-10 MED ORDER — TRAMADOL HCL 50 MG PO TABS: 50.0000 mg | ORAL_TABLET | Freq: Four times a day (QID) | ORAL | Status: DC | PRN

## 2014-08-10 MED ORDER — ASPIRIN 81 MG PO CHEW
CHEWABLE_TABLET | ORAL | Status: AC
  Filled 2014-08-10: qty 4

## 2014-08-10 MED ORDER — SODIUM CHLORIDE 0.9 % IV SOLN
INTRAVENOUS | Status: DC
  Administered 2014-08-10: 19:00:00 via INTRAVENOUS

## 2014-08-10 MED ORDER — NITROGLYCERIN 0.4 MG SL SUBL
SUBLINGUAL_TABLET | SUBLINGUAL | Status: AC
  Filled 2014-08-10: qty 1

## 2014-08-10 NOTE — Discharge Instructions (Signed)
We have determined that your problem requires further evaluation in the emergency department.  We will take care of your transport there.  Once at the emergency department, you will be evaluated by a provider and they will order whatever treatment or tests they deem necessary.  We cannot guarantee that they will do any specific test or do any specific treatment.  ° °

## 2014-08-10 NOTE — ED Notes (Signed)
Pt is a transfer from urgent care. Pt started experiencing chest pain this afternoon while working. Pt went to urgent care, and was sent here. Pt given 1 nitro at urgent care, which dropped her BP to 86/56. Pt's BP now is 148/84. Pt also given 324 ASA.

## 2014-08-10 NOTE — ED Provider Notes (Signed)
Chief Complaint   Chest Pain   History of Present Illness    Jessica Ponce is a 48 year old female with diabetes uncontrolled high blood pressure who has had a history since 2 PM today of left pectoral chest pain without radiation. This is described as a dull ache rated 3/intensity. It began while she was working at home, but not doing any exertion, and she denies any injury to the chest. She felt somewhat lightheaded and fatigued, but denies any associated shortness of breath, nausea, or diaphoresis. The pain is not pleuritic. She denies fever, chills, or URI symptoms. She's had no coughing or wheezing. No palpitations, presyncope, or syncope. She denies any leg swelling, but she has had some pain in the right medial thigh. She denies any abdominal pain, nausea, or vomiting, but has had some diarrhea. She has a history of diabetes and high blood pressure as well as asthma. She denies any history of hypercholesterolemia, cigarette smoking, or family history of heart disease. She herself has no personal history of heart disease has never had any pain like this before.  Review of Systems    Other than noted above, the patient denies any of the following symptoms. Systemic:  No fever or chills. Pulmonary:  No cough, wheezing, shortness of breath, sputum production, hemoptysis. Cardiac:  No palpitations, rapid heartbeat, dizziness, presyncope or syncope. GI:  No abdominal pain, heartburn, nausea, or vomiting. Ext:  No leg pain or swelling.  Bland    Past medical history, family history, social history, meds, and allergies were reviewed. She is intolerant to codeine. She takes no medications currently. She has a history of diabetes, hypertension, and asthma.  Physical Exam     Vital signs:  BP 188/129  Pulse 74  Temp(Src) 99.2 F (37.3 C) (Oral)  SpO2 99% Gen:  Alert, oriented, in no distress, skin warm and dry. ENT:  Mucous membranes moist, pharynx clear. Neck:  Supple, no adenopathy or  tenderness.  No JVD. Lungs:  Clear to auscultation, no wheezes, rales or rhonchi.  No respiratory distress. Heart:  Regular rhythm.  No gallops, murmers, clicks or rubs. Chest:  There was some localized chest wall tenderness to palpation in the left pectoral area. Abdomen:  Soft, nontender, no organomegaly or mass.  Bowel sounds normal.  No pulsatile abdominal mass or bruit. Ext:  No edema.  No calf tenderness and Homann's sign negative.  Pulses full and equal. She has some tenderness to palpation over the right medial thigh. No palpable cord or erythema. Skin:  Warm and dry.  No rash.   EKG Results:  Date: 08/10/2014  Rate: 77  Rhythm: normal sinus rhythm  QRS Axis: normal  Intervals: normal  ST/T Wave abnormalities: normal  Conduction Disutrbances:none  Narrative Interpretation: Normal sinus rhythm, normal EKG.  Old EKG Reviewed: none available    Course in Urgent [facility]         She was begun on IV normal saline at 50 mL per hour, monitor, oxygen via nasal cannula, given nitroglycerin 0.4 mg sublingually and aspirin 325 mg by mouth.  Assessment     There were no encounter diagnoses.  Differential diagnosis is acute coronary syndrome, pulmonary embolism, ruptured aneurysm, pneumothorax, Boerhaave syndrome, pericarditis, musculoskeletal pain, or reflux esophagitis.   Plan     The patient was transferred to the ED via EMS in stable condition.  Medical Decision Making:  The patient is a 48 year old female with diabetes uncontrolled high blood pressure who has had a history since 2 PM today of left pectoral chest pain without radiation. This is described as a dull ache rated 3/intensity. It began while she was working at home, but not doing any exertion, and she denies any injury to the chest. She felt somewhat lightheaded and  fatigued, but denies any associated shortness of breath, nausea, or diaphoresis. The pain is not pleuritic. She denies fever, chills, or URI symptoms. She's had no coughing or wheezing. No palpitations, presyncope, or syncope. She denies any leg swelling, but she has had some pain in the right medial thigh. She denies any abdominal pain, nausea, or vomiting, but has had some diarrhea. She has a history of diabetes and high blood pressure as well as asthma. She denies any history of hypercholesterolemia, cigarette smoking, or family history of heart disease. She herself has no personal history of heart disease has never had any pain like this before. Her EKG shows normal sinus rhythm and otherwise within normal limits, however since she has a number of risk factors and her blood pressure is not controlled, she will need further evaluation in the emergency department.     Harden Mo, MD 08/10/14 [phone]

## 2014-08-10 NOTE — ED Notes (Signed)
Patient noted sh felt dizzy, BP recheck revealed SIGNIFICANT decrease in readings. lLay  Flat, MD advised, [facility] here to transport

## 2014-08-10 NOTE — ED Notes (Signed)
C/o onset of pain in her chest earlier today. Pain worse w direct palpation of chest wall. States pain onset at rest , hurts to breathe

## 2014-08-10 NOTE — Discharge Instructions (Signed)
You have been evaluated for your chest pain.  No specific diagnosis were made.  However, no obvious sign of heart attack or other acute emergent condition identified.  Please follow up with your doctor for further care.  Return if your condition worsen or if you have other concerns.    Chest Pain (Nonspecific) It is often hard to give a specific diagnosis for the cause of chest pain. There is always a chance that your pain could be related to something serious, such as a heart attack or a blood clot in the lungs. You need to follow up with your health care provider for further evaluation. CAUSES   Heartburn.  Pneumonia or bronchitis.  Anxiety or stress.  Inflammation around your heart (pericarditis) or lung (pleuritis or pleurisy).  A blood clot in the lung.  A collapsed lung (pneumothorax). It can develop suddenly on its own (spontaneous pneumothorax) or from trauma to the chest.  Shingles infection (herpes zoster virus). The chest wall is composed of bones, muscles, and cartilage. Any of these can be the source of the pain.  The bones can be bruised by injury.  The muscles or cartilage can be strained by coughing or overwork.  The cartilage can be affected by inflammation and become sore (costochondritis). DIAGNOSIS  Lab tests or other studies may be needed to find the cause of your pain. Your health care provider may have you take a test called an ambulatory electrocardiogram (ECG). An ECG records your heartbeat patterns over a 24-hour period. You may also have other tests, such as:  Transthoracic echocardiogram (TTE). During echocardiography, sound waves are used to evaluate how blood flows through your heart.  Transesophageal echocardiogram (TEE).  Cardiac monitoring. This allows your health care provider to monitor your heart rate and rhythm in real time.  Holter monitor. This is a portable device that records your heartbeat and can help diagnose heart arrhythmias. It allows  your health care provider to track your heart activity for several days, if needed.  Stress tests by exercise or by giving medicine that makes the heart beat faster. TREATMENT   Treatment depends on what may be causing your chest pain. Treatment may include:  Acid blockers for heartburn.  Anti-inflammatory medicine.  Pain medicine for inflammatory conditions.  Antibiotics if an infection is present.  You may be advised to change lifestyle habits. This includes stopping smoking and avoiding alcohol, caffeine, and chocolate.  You may be advised to keep your head raised (elevated) when sleeping. This reduces the chance of acid going backward from your stomach into your esophagus. Most of the time, nonspecific chest pain will improve within 2-3 days with rest and mild pain medicine.  HOME CARE INSTRUCTIONS   If antibiotics were prescribed, take them as directed. Finish them even if you start to feel better.  For the next few days, avoid physical activities that bring on chest pain. Continue physical activities as directed.  Do not use any tobacco products, including cigarettes, chewing tobacco, or electronic cigarettes.  Avoid drinking alcohol.  Only take medicine as directed by your health care provider.  Follow your health care provider's suggestions for further testing if your chest pain does not go away.  Keep any follow-up appointments you made. If you do not go to an appointment, you could develop lasting (chronic) problems with pain. If there is any problem keeping an appointment, call to reschedule. SEEK MEDICAL CARE IF:   Your chest pain does not go away, even after treatment.  You have a rash with blisters on your chest.  You have a fever. SEEK IMMEDIATE MEDICAL CARE IF:   You have increased chest pain or pain that spreads to your arm, neck, jaw, back, or abdomen.  You have shortness of breath.  You have an increasing cough, or you cough up blood.  You have  severe back or abdominal pain.  You feel nauseous or vomit.  You have severe weakness.  You faint.  You have chills. This is an emergency. Do not wait to see if the pain will go away. Get medical help at once. Call your local emergency services (911 in [place] Do not drive yourself to the hospital. MAKE SURE YOU:   Understand these instructions.  Will watch your condition.  Will get help right away if you are not doing well or get worse. Document Released: 08/26/2005 Document Revised: 11/21/2013 Document Reviewed: 06/21/2008 [facility] Patient Information 2015 [place], [place]. This information is not intended to replace advice given to you by your health care provider. Make sure you discuss any questions you have with your health care provider.

## 2014-08-10 NOTE — ED Provider Notes (Signed)
CSN: [id number]     Arrival date & time 08/10/14  1935 History   First MD Initiated Contact with Patient 08/10/14 2139     Chief Complaint  Patient presents with  . Chest Pain     (Consider location/radiation/quality/duration/timing/severity/associated sxs/prior Treatment) HPI 48 year old female with history of diabetes, asthma, hypertension who was sent here from urgent care for further evaluation of chest pain. Patient report waking up this morning feeling a bit tired and lightheadedness. She works as a number of andreport gradual onset of left-sided chest pain which started 7 hours ago while at work.  No heavy lifting or strenuous activity. Pain is described as a dull and achy sensation, 3/10, nonradiating, with associated lightheadedness, fatigue but no shortness of breath, diaphoresis, or nausea. Pain is nonpleuritic. No associated fever, chills, headache, productive cough, hemoptysis, abdominal pain, back pain, numbness, or rash. No urinary symptoms. No wheezing or coughing. She reports having mild intermittent tenderness to the right medial thigh for the past 2 months which her doctor is aware. He denies any localized leg swelling or calf pain. No prior history of PE or DVT, no recent surgery, prolonged bed rest, history of active cancer, hemoptysis. Denies any significant family history of premature cardiac death or any personal history of heart disease. She is a nonsmoker. Her pain has been persistent throughout the day even at rest. She was seen at urgent care for this complaint but was recommended to come to ER for further evaluation. She did receive a sublingual nitroglycerin and aspirin at the urgent care. Shortly after receiving the medication she fell very lightheadedness and her blood pressure drops down to 86/56. She lays flat for a short amount of time and her symptoms resolved. She denies any history of drug allergies.     Past Medical History  Diagnosis Date  . Asthma   .  Diabetes mellitus without complication    Past Surgical History  Procedure Laterality Date  . Appendectomy     History reviewed. No pertinent family history. History  Substance Use Topics  . Smoking status: Never Smoker   . Smokeless tobacco: Not on file  . Alcohol Use: No   OB History   Grav Para Term Preterm Abortions TAB SAB Ect Mult Living                 Review of Systems  All other systems reviewed and are negative.     Allergies  Review of patient's allergies indicates no known allergies.  Home Medications   Prior to Admission medications   Medication Sig Start Date End Date Taking? Authorizing Provider  albuterol (PROVENTIL HFA;VENTOLIN HFA) 108 (90 BASE) MCG/ACT inhaler Inhale 1 puff into the lungs every 6 (six) hours as needed for wheezing or shortness of breath.   Yes Historical Provider, MD  Azelaic Acid (FINACEA EX) Apply 1 application topically daily as needed. For dry skin   Yes Historical Provider, MD  Fluticasone-Salmeterol (ADVAIR) 250-50 MCG/DOSE AEPB Inhale 1 puff into the lungs 2 (two) times daily.   Yes Historical Provider, MD   BP 148/84  Temp(Src) 98.3 F (36.8 C) (Oral)  Resp 15  Ht 5\' 5"  (1.651 m)  Wt 202 lb (91.627 kg)  BMI 33.61 kg/m2  SpO2 96%  LMP 07/17/2014 Physical Exam  Nursing note and vitals reviewed. Constitutional: She appears well-developed and well-nourished. No distress.   Moderately obese Caucasian female she is to be in no acute distress  HENT:  Head: Atraumatic.  Eyes: Conjunctivae are  normal.  Neck: Neck supple. No JVD present.  Cardiovascular: Normal rate, regular rhythm and intact distal pulses.   Pulmonary/Chest: Effort normal and breath sounds normal. She exhibits tenderness (mild left upper chest wall tenderness to palpation without crepitus or emphysema. No overlying skin changes.).  Abdominal: Soft. There is no tenderness.  Musculoskeletal: She exhibits no edema.  Neurological: She is alert.  Skin: No rash  noted.  Psychiatric: She has a normal mood and affect.    ED Course  Procedures (including critical care time)  Patient here with atypical chest pain for ACS, reproducible on exam likely to be musculoskeletal. PERC negative.  She did have a transient drop in her blood pressure after receiving sublingual nitroglycerin however her blood pressure has normalized and is now mildly hypertensive in the ED. Given the fact that patient is a bit anxious, I suspect her elevated blood pressures related to anxiety due to the situation. Workup initiated. We'll recheck vital signs. Her EKG at this time is normal without any acute ischemic changes.  Her initial ECG at Urgent Care earlier today was normal as well.    11:10 PM Chest x-ray is without any acute finding. She is currently hemodynamically stable. I have discussed with Dr. Audie Pinto and our plan is to have pt f/u with PCP for further care.  Doubt acute emergent condition at this time.  Strict return precaution given.    Labs Review Labs Reviewed  CBC - Abnormal; Notable for the following:    WBC 10.9 (*)    All other components within normal limits  BASIC METABOLIC PANEL - Abnormal; Notable for the following:    Glucose, Bld 150 (*)    All other components within normal limits  Randolm Idol, ED    Imaging Review Dg Chest Port 1 View  08/10/2014   CLINICAL DATA:  Left-sided chest pain.  EXAM: PORTABLE CHEST - 1 VIEW  COMPARISON:  08/02/2012  FINDINGS: The cardiomediastinal silhouette is unremarkable.  There is no evidence of focal airspace disease, pulmonary edema, suspicious pulmonary nodule/mass, pleural effusion, or pneumothorax. No acute bony abnormalities are identified.  IMPRESSION: No active disease.   Electronically Signed   By: Hassan Rowan M.D.   On: 08/10/2014 22:07     EKG Interpretation None      Date: 08/10/2014  Rate: 69  Rhythm: normal sinus rhythm  QRS Axis: normal  Intervals: normal  ST/T Wave abnormalities: normal   Conduction Disutrbances: none  Narrative Interpretation:   Old EKG Reviewed: No significant changes noted     MDM   Final diagnoses:  Chest pain at rest    BP 139/93  Pulse 84  Temp(Src) 98.3 F (36.8 C) (Oral)  Resp 20  Ht 5\' 5"  (1.651 m)  Wt 202 lb (91.627 kg)  BMI 33.61 kg/m2  SpO2 97%  LMP 07/17/2014  I have reviewed nursing notes and vital signs. I personally reviewed the imaging tests through PACS system  I reviewed available ER/hospitalization records thought the EMR     Domenic Moras, [place] 08/10/14 2313

## 2014-08-11 NOTE — ED Provider Notes (Signed)
Medical screening examination/treatment/procedure(s) were performed by non-physician practitioner and as supervising physician I was immediately available for consultation/collaboration.   Dot Lanes, MD 08/11/14 1041

## 2014-08-14 ENCOUNTER — Other Ambulatory Visit: Payer: Self-pay | Admitting: Family Medicine

## 2014-08-14 DIAGNOSIS — R229 Localized swelling, mass and lump, unspecified: Secondary | ICD-10-CM

## 2014-08-14 DIAGNOSIS — Z789 Other specified health status: Secondary | ICD-10-CM

## 2014-08-21 ENCOUNTER — Ambulatory Visit
Admission: RE | Admit: 2014-08-21 | Discharge: 2014-08-21 | Disposition: A | Discharge status: Home / Self Care | Payer: 59 | Source: Ambulatory Visit | Attending: Family Medicine | Admitting: Family Medicine

## 2014-08-21 DIAGNOSIS — R229 Localized swelling, mass and lump, unspecified: Secondary | ICD-10-CM

## 2014-08-21 DIAGNOSIS — Z789 Other specified health status: Secondary | ICD-10-CM

## 2015-12-12 ENCOUNTER — Emergency Department (HOSPITAL_COMMUNITY): Payer: 59

## 2015-12-12 ENCOUNTER — Encounter (HOSPITAL_COMMUNITY): Payer: Self-pay | Admitting: Emergency Medicine

## 2015-12-12 ENCOUNTER — Emergency Department (HOSPITAL_COMMUNITY)
Admission: EM | Admit: 2015-12-12 | Discharge: 2015-12-13 | Disposition: A | Discharge status: Home / Self Care | Payer: 59 | Attending: Emergency Medicine | Admitting: Emergency Medicine

## 2015-12-12 DIAGNOSIS — Z79899 Other long term (current) drug therapy: Secondary | ICD-10-CM | POA: Diagnosis not present

## 2015-12-12 DIAGNOSIS — M79622 Pain in left upper arm: Secondary | ICD-10-CM | POA: Diagnosis not present

## 2015-12-12 DIAGNOSIS — R200 Anesthesia of skin: Secondary | ICD-10-CM | POA: Diagnosis present

## 2015-12-12 DIAGNOSIS — J45909 Unspecified asthma, uncomplicated: Secondary | ICD-10-CM | POA: Insufficient documentation

## 2015-12-12 DIAGNOSIS — E119 Type 2 diabetes mellitus without complications: Secondary | ICD-10-CM | POA: Insufficient documentation

## 2015-12-12 DIAGNOSIS — I10 Essential (primary) hypertension: Secondary | ICD-10-CM | POA: Insufficient documentation

## 2015-12-12 DIAGNOSIS — M79602 Pain in left arm: Secondary | ICD-10-CM

## 2015-12-12 LAB — URINALYSIS, ROUTINE W REFLEX MICROSCOPIC
Bilirubin Urine: NEGATIVE
Glucose, UA: NEGATIVE mg/dL
Hgb urine dipstick: NEGATIVE
Ketones, ur: NEGATIVE mg/dL
Leukocytes, UA: NEGATIVE
Nitrite: NEGATIVE
Protein, ur: NEGATIVE mg/dL
Specific Gravity, Urine: 1.006 (ref 1.005–1.030)
pH: 5.5 (ref 5.0–8.0)

## 2015-12-12 LAB — BASIC METABOLIC PANEL
Anion gap: 14 (ref 5–15)
BUN: 13 mg/dL (ref 6–20)
CO2: 22 mmol/L (ref 22–32)
Calcium: 9.7 mg/dL (ref 8.9–10.3)
Chloride: 103 mmol/L (ref 101–111)
Creatinine, Ser: 0.93 mg/dL (ref 0.44–1.00)
GFR calc Af Amer: >60 mL/min (ref >60)
GFR calc non Af Amer: >60 mL/min (ref >60)
Glucose, Bld: 121 mg/dL — ABNORMAL HIGH (ref 65–99)
Potassium: 4 mmol/L (ref 3.5–5.1)
Sodium: 139 mmol/L (ref 135–145)

## 2015-12-12 LAB — CBC
HCT: 43.3 % (ref 36.0–46.0)
Hemoglobin: 14.5 g/dL (ref 12.0–15.0)
MCH: 29.7 pg (ref 26.0–34.0)
MCHC: 33.5 g/dL (ref 30.0–36.0)
MCV: 88.5 fL (ref 78.0–100.0)
Platelets: 301 10*3/uL (ref 150–400)
RBC: 4.89 MIL/uL (ref 3.87–5.11)
RDW: 14.2 % (ref 11.5–15.5)
WBC: 10.6 10*3/uL — ABNORMAL HIGH (ref 4.0–10.5)

## 2015-12-12 LAB — PROTIME-INR
INR: 0.96 (ref 0.00–1.49)
Prothrombin Time: 13 seconds (ref 11.6–15.2)

## 2015-12-12 LAB — TROPONIN I: Troponin I: <0.03 ng/mL (ref <0.031)

## 2015-12-12 MED ORDER — LORAZEPAM 2 MG/ML IJ SOLN
1.0000 mg | Freq: Once | INTRAMUSCULAR | Status: AC
  Administered 2015-12-12: 1 mg via INTRAVENOUS
  Filled 2015-12-12: qty 1

## 2015-12-12 NOTE — ED Notes (Signed)
Spoke with md plunkett about this pt no code stroke. Ct ordered.

## 2015-12-12 NOTE — Discharge Instructions (Signed)
Continue your regular home medications. Please follow-up with neurology, call tomorrow morning to make an appointment. Return to the ED for new or worsening symptoms-- facial droop, worsening numbness, weakness, changes in speech, difficulty walking, visual disturbance, etc.

## 2015-12-12 NOTE — ED Provider Notes (Signed)
CSN: [id number]     Arrival date & time 12/12/15  1434 History   First MD Initiated Contact with Patient 12/12/15 2006     Chief Complaint  Patient presents with  . Numbness     (Consider location/radiation/quality/duration/timing/severity/associated sxs/prior Treatment) The history is provided by the patient and medical records.    50 year old female with history of asthma, diabetes, hypertension, presenting to the ED for left-sided facial numbness. Patient states symptoms began probably at 1300 today. She states her face feels as though she just underwent Novocain injection by her dentist. She states it is mostly localized to her left cheek and left upper and lower lips. She states her tongue feels normal. She is not had any trouble swallowing or handling her secretions. Patient states over the past few hours her symptoms have persisted, however now she has developed some discomfort in her left upper arm. She states this is mostly localized to her left shoulder and proximal upper arm. She denies numbness or weakness. no injury or trauma to arm or neck.  Patient states she has not exercised in 2 weeks so does not feel it is that.  She has not had any headache, dizziness, confusion, changes in speech, tinnitus, visual disturbance, difficulty ambulating, or facial droop. She's not currently on any type of anticoagulation. No history of TIA or stroke. She denies any chest pain or shortness of breath. Patient recently came off of all of her diabetes medications as she states her blood sugars now controlled with diet alone. PCP- Harlan Stains  Past Medical History  Diagnosis Date  . Asthma   . Diabetes mellitus without complication [facility])    Past Surgical History  Procedure Laterality Date  . Appendectomy     No family history on file. Social History  Substance Use Topics  . Smoking status: Never Smoker   . Smokeless tobacco: None  . Alcohol Use: No   OB History    No data available      Review of Systems  Musculoskeletal: Positive for arthralgias.  Neurological: Positive for numbness.  All other systems reviewed and are negative.     Allergies  Codeine  Home Medications   Prior to Admission medications   Medication Sig Start Date End Date Taking? Authorizing Provider  albuterol (PROVENTIL HFA;VENTOLIN HFA) 108 (90 BASE) MCG/ACT inhaler Inhale 1 puff into the lungs every 6 (six) hours as needed for wheezing or shortness of breath.   Yes Historical Provider, MD  amLODipine (NORVASC) 5 MG tablet Take 5 mg by mouth daily.   Yes Historical Provider, MD  losartan (COZAAR) 100 MG tablet Take 100 mg by mouth daily.   Yes Historical Provider, MD  traMADol (ULTRAM) 50 MG tablet Take 1 tablet (50 mg total) by mouth every 6 (six) hours as needed for moderate pain. Patient not taking: Reported on 12/12/2015 08/10/14   Domenic Moras, PA-C   BP 135/91 mmHg  Pulse 68  Temp(Src) 97.8 F (36.6 C) (Oral)  Resp 14  Ht 5\' 4"  (1.626 m)  Wt 91.627 kg  BMI 34.66 kg/m2  SpO2 100%  LMP 12/10/2015   Physical Exam  Constitutional: She is oriented to person, place, and time. She appears well-developed and well-nourished. No distress.  HENT:  Head: Normocephalic and atraumatic.  Mouth/Throat: Oropharynx is clear and moist.  Eyes: Conjunctivae and EOM are normal. Pupils are equal, round, and reactive to light.  EOMs intact, no nystagmus Pupils symmetric and reactive bilaterally  Neck: Normal range of motion. Neck  supple.  Cardiovascular: Normal rate, regular rhythm and normal heart sounds.   Pulmonary/Chest: Effort normal and breath sounds normal. No respiratory distress. She has no wheezes.  Abdominal: Soft. Bowel sounds are normal. There is no tenderness. There is no guarding.  Musculoskeletal: Normal range of motion. She exhibits no edema.  Neurological: She is alert and oriented to person, place, and time.  AAOx3, answering questions and following commands appropriately; equal  strength UE and LE bilaterally; CN grossly intact; moves all extremities appropriately without ataxia; decreased sensation to left side of face, predominantly left cheek and left upper and lower lip; normal tongue protrusion; normal speech; no facial droop; normal gait  Skin: Skin is warm and dry. She is not diaphoretic.  Psychiatric: She has a normal mood and affect.  Nursing note and vitals reviewed.   ED Course  Procedures (including critical care time) Labs Review Labs Reviewed  BASIC METABOLIC PANEL - Abnormal; Notable for the following:    Glucose, Bld 121 (*)    All other components within normal limits  CBC - Abnormal; Notable for the following:    WBC 10.6 (*)    All other components within normal limits  URINALYSIS, ROUTINE W REFLEX MICROSCOPIC (NOT AT [facility])  PROTIME-INR  TROPONIN I  CBG MONITORING, ED    Imaging Review Ct Head Wo Contrast  12/12/2015  CLINICAL DATA:  Acute onset left-sided facial and left upper extremity numbness. EXAM: CT HEAD WITHOUT CONTRAST TECHNIQUE: Contiguous axial images were obtained from the base of the skull through the vertex without intravenous contrast. COMPARISON:  None. FINDINGS: The ventricles are normal in size and configuration. There is no intracranial mass, hemorrhage, extra-axial fluid collection, or midline shift. The gray-white compartments appear normal. No acute infarct evident. The middle cerebral arteries show symmetric and normal attenuation bilaterally. The bony calvarium appears intact. The mastoid air cells are clear. No intraorbital lesions are identified. There are retention cysts in the right maxillary antrum. IMPRESSION: Retention cysts in right maxillary antrum. No intracranial mass, hemorrhage, or focal gray - white compartment lesions/acute appearing infarct. Electronically Signed   By: Lowella Grip III M.D.   On: 12/12/2015 16:12   Mr Brain Wo Contrast  12/12/2015  CLINICAL DATA:  Initial evaluation for left-sided  facial and upper extremity numbness. EXAM: MRI HEAD WITHOUT CONTRAST MRI CERVICAL SPINE WITHOUT CONTRAST TECHNIQUE: Multiplanar, multiecho pulse sequences of the brain and surrounding structures, and cervical spine, to include the craniocervical junction and cervicothoracic junction, were obtained without intravenous contrast. COMPARISON:  None. FINDINGS: MRI HEAD FINDINGS The CSF containing spaces are within normal limits for patient age. No focal parenchymal signal abnormality is identified. No mass lesion, midline shift, or extra-axial fluid collection. Ventricles are normal in size without evidence of hydrocephalus. No diffusion-weighted signal abnormality is identified to suggest acute intracranial infarct. Gray-white matter differentiation is maintained. Normal flow voids are seen within the intracranial vasculature. No intracranial hemorrhage identified. The cervicomedullary junction is normal. Pituitary gland is within normal limits. Pituitary stalk is midline. The globes and optic nerves demonstrate a normal appearance with normal signal intensity. The bone marrow signal intensity is normal. Calvarium is intact. Visualized upper cervical spine is within normal limits. Scalp soft tissues are unremarkable. Polypoid opacity present within the inferior right maxillary sinus. Paranasal sinuses are otherwise clear. No mastoid effusion. MRI CERVICAL SPINE FINDINGS Mild reversal of the normal cervical lordosis with apex at C5. Trace anterolisthesis of C4 on C5, with trace retrolisthesis of C5 on C6. Dens is  somewhat angulated posteriorly. There is secondary mild narrowing at the craniocervical junction without frank impingement upon the brainstem. Vertebral body heights maintained. No fracture. Signal intensity within the vertebral body bone marrow is somewhat heterogeneous without focal lesion. No marrow edema. Signal intensity within the cervical spinal cord is normal. No acute paraspinous soft tissue  abnormality. C2-C3: Broad-based posterior disc bulge with mild bilateral uncovertebral spurring. Left worse than right facet arthrosis. Resultant moderate left foraminal narrowing with more mild right foraminal stenosis. No canal stenosis. C3-C4: No significant disc bulge. Mild bilateral uncovertebral spurring. Left worse than right facet arthrosis. No significant canal or foraminal stenosis. C4-C5: Trace anterolisthesis of C4 on C5. Broad-based posterior disc bulge, a centric to the right. There is flattening with partial effacement of the ventral thecal sac with minimal flattening of the ventral cord. No cord signal changes. Superimposed bilateral uncovertebral hypertrophy and left worse than right facet arthrosis. Resultant moderate left with more mild right foraminal stenosis. Very mild canal narrowing. C5-C6: Trace retrolisthesis of C5 on C6. Diffuse disc bulge with bilateral uncovertebral spurring, left worse than right. Mild left-sided facet arthrosis. Left foraminal disc osteophyte complex results in moderate to severe left foraminal narrowing. There is moderate right foraminal stenosis as well. Posterior disc osteophyte flattens and partially effaces the ventral thecal sac results in mild canal stenosis. C6-C7: Circumferential disc bulge with mild bilateral uncovertebral hypertrophy. Mild ligamentum flavum thickening. Moderate to severe right foraminal narrowing with more moderate left foraminal stenosis. Mild canal stenosis. C7-T1: Posterior and left foraminal broad-based disc bulge with superimposed left uncovertebral spurring. This left foraminal disc osteophyte complex results in moderate left foraminal narrowing. No significant right foraminal or canal stenosis. Degenerative disc bulge present at T2-3 without significant stenosis. Remainder of the visualized upper thoracic spine within normal limits. IMPRESSION: MRI HEAD IMPRESSION: Normal brain MRI. MRI CERVICAL SPINE IMPRESSION: 1. No acute  abnormality within the cervical spine. 2. Reversal of the normal cervical lordosis with moderate multilevel degenerative spondylolysis as above. There is resultant moderate to severe left foraminal narrowing at C5-6, with more moderate left foraminal stenosis at C6-7, C7-T1 and C2-3. Moderate to severe right foraminal stenosis at C6-7, with more moderate right foraminal narrowing at C5-6. Mild canal stenosis at C4-5 through C6-7. Electronically Signed   By: Jeannine Boga M.D.   On: 12/12/2015 23:06   Mr Cervical Spine Wo Contrast  12/12/2015  CLINICAL DATA:  Initial evaluation for left-sided facial and upper extremity numbness. EXAM: MRI HEAD WITHOUT CONTRAST MRI CERVICAL SPINE WITHOUT CONTRAST TECHNIQUE: Multiplanar, multiecho pulse sequences of the brain and surrounding structures, and cervical spine, to include the craniocervical junction and cervicothoracic junction, were obtained without intravenous contrast. COMPARISON:  None. FINDINGS: MRI HEAD FINDINGS The CSF containing spaces are within normal limits for patient age. No focal parenchymal signal abnormality is identified. No mass lesion, midline shift, or extra-axial fluid collection. Ventricles are normal in size without evidence of hydrocephalus. No diffusion-weighted signal abnormality is identified to suggest acute intracranial infarct. Gray-white matter differentiation is maintained. Normal flow voids are seen within the intracranial vasculature. No intracranial hemorrhage identified. The cervicomedullary junction is normal. Pituitary gland is within normal limits. Pituitary stalk is midline. The globes and optic nerves demonstrate a normal appearance with normal signal intensity. The bone marrow signal intensity is normal. Calvarium is intact. Visualized upper cervical spine is within normal limits. Scalp soft tissues are unremarkable. Polypoid opacity present within the inferior right maxillary sinus. Paranasal sinuses are otherwise clear.  No mastoid  effusion. MRI CERVICAL SPINE FINDINGS Mild reversal of the normal cervical lordosis with apex at C5. Trace anterolisthesis of C4 on C5, with trace retrolisthesis of C5 on C6. Dens is somewhat angulated posteriorly. There is secondary mild narrowing at the craniocervical junction without frank impingement upon the brainstem. Vertebral body heights maintained. No fracture. Signal intensity within the vertebral body bone marrow is somewhat heterogeneous without focal lesion. No marrow edema. Signal intensity within the cervical spinal cord is normal. No acute paraspinous soft tissue abnormality. C2-C3: Broad-based posterior disc bulge with mild bilateral uncovertebral spurring. Left worse than right facet arthrosis. Resultant moderate left foraminal narrowing with more mild right foraminal stenosis. No canal stenosis. C3-C4: No significant disc bulge. Mild bilateral uncovertebral spurring. Left worse than right facet arthrosis. No significant canal or foraminal stenosis. C4-C5: Trace anterolisthesis of C4 on C5. Broad-based posterior disc bulge, a centric to the right. There is flattening with partial effacement of the ventral thecal sac with minimal flattening of the ventral cord. No cord signal changes. Superimposed bilateral uncovertebral hypertrophy and left worse than right facet arthrosis. Resultant moderate left with more mild right foraminal stenosis. Very mild canal narrowing. C5-C6: Trace retrolisthesis of C5 on C6. Diffuse disc bulge with bilateral uncovertebral spurring, left worse than right. Mild left-sided facet arthrosis. Left foraminal disc osteophyte complex results in moderate to severe left foraminal narrowing. There is moderate right foraminal stenosis as well. Posterior disc osteophyte flattens and partially effaces the ventral thecal sac results in mild canal stenosis. C6-C7: Circumferential disc bulge with mild bilateral uncovertebral hypertrophy. Mild ligamentum flavum thickening.  Moderate to severe right foraminal narrowing with more moderate left foraminal stenosis. Mild canal stenosis. C7-T1: Posterior and left foraminal broad-based disc bulge with superimposed left uncovertebral spurring. This left foraminal disc osteophyte complex results in moderate left foraminal narrowing. No significant right foraminal or canal stenosis. Degenerative disc bulge present at T2-3 without significant stenosis. Remainder of the visualized upper thoracic spine within normal limits. IMPRESSION: MRI HEAD IMPRESSION: Normal brain MRI. MRI CERVICAL SPINE IMPRESSION: 1. No acute abnormality within the cervical spine. 2. Reversal of the normal cervical lordosis with moderate multilevel degenerative spondylolysis as above. There is resultant moderate to severe left foraminal narrowing at C5-6, with more moderate left foraminal stenosis at C6-7, C7-T1 and C2-3. Moderate to severe right foraminal stenosis at C6-7, with more moderate right foraminal narrowing at C5-6. Mild canal stenosis at C4-5 through C6-7. Electronically Signed   By: Jeannine Boga M.D.   On: 12/12/2015 23:06   I have personally reviewed and evaluated these images and lab results as part of my medical decision-making.   EKG Interpretation None      MDM   Final diagnoses:  Left facial numbness  Left arm pain   50 year old female here with numbness to left side of face which began today at 1300. No history of same. On exam, patient does have decreased sensation of the left side of her face, predominantly cheek and left side of mouth. Neurologic exam is otherwise nonfocal.  She also has some left arm pain which is not necessarily reproducible on exam. She denies any recent injury or trauma to her arm or neck. She denies any chest pain or shortness of breath. Will add cardiac enzymes, will discuss case with neurology for imaging recommendations.  8:34 PM Case discussed with Dr. Janann Colonel-- recommends MRI brain and CS.  If  negative and remainder of work-up unremarkable, can follow-up as OP with neurology clinic.  11:44 PM  MRI brain negative.  MRI CS with multi-level degenerative changes as well as some left foraminal narrowing which may be source of her left arm symptoms.  Patient's neurologic exam remains unchanged.  Discussed MRI results with patient, she acknowledged understanding.  Will have patient follow-up with OP neurologist as recommended by Dr. Janann Colonel.  Discussed plan with patient, he/she acknowledged understanding and agreed with plan of care.  Return precautions given for new or worsening symptoms.  Larene Pickett, PA-C 12/13/15 [id number]  Daleen Bo, MD 12/13/15 [phone]

## 2015-12-12 NOTE — ED Notes (Signed)
Pt states 1 hour ago she started having numbness on the left side of her face that is now into her left arm. Pt has no weakness or other neuro deficits. Speech is clear. Pt denies any headache. Pt states she feels lightheaded.

## 2015-12-12 NOTE — ED Notes (Signed)
Patient transported to MRI 

## 2015-12-12 NOTE — ED Notes (Signed)
PA at bedside.

## 2015-12-17 ENCOUNTER — Ambulatory Visit (INDEPENDENT_AMBULATORY_CARE_PROVIDER_SITE_OTHER): Payer: 59 | Admitting: Neurology

## 2015-12-17 ENCOUNTER — Encounter: Payer: Self-pay | Admitting: Neurology

## 2015-12-17 VITALS — BP 134/85 | HR 70 | Ht 64.0 in | Wt 192.8 lb

## 2015-12-17 DIAGNOSIS — M79602 Pain in left arm: Secondary | ICD-10-CM | POA: Diagnosis not present

## 2015-12-17 DIAGNOSIS — M5010 Cervical disc disorder with radiculopathy, unspecified cervical region: Secondary | ICD-10-CM | POA: Diagnosis not present

## 2015-12-17 DIAGNOSIS — R208 Other disturbances of skin sensation: Secondary | ICD-10-CM | POA: Diagnosis not present

## 2015-12-17 DIAGNOSIS — R200 Anesthesia of skin: Secondary | ICD-10-CM

## 2015-12-17 MED ORDER — CYCLOBENZAPRINE HCL 10 MG PO TABS: 10.0000 mg | ORAL_TABLET | Freq: Three times a day (TID) | ORAL | Status: DC | PRN

## 2015-12-17 NOTE — Progress Notes (Signed)
[facility]    Provider:  Dr Jaynee Eagles Referring Provider: Harlan Stains, MD Primary Care Physician:  Vidal Schwalbe, MD  CC:  Left-sided numbness  HPI:  Jessica Ponce is a 50 y.o. female here as a referral from Dr. Dema Severin for Left-sided numbness. PMHx asthma, diabetes, HTN It started in the last year with some numbness in the face. Thursday at 1pm she acute onset symptoms, no inciting events, it felt like novacaine on the face, more on the left side of the face but also on the right side of the face, she had shoulder pain and radiation into the left arm. Felt weird. Symptoms have been ongoing since then. She has an ache in her left shoulder and left arm. The face is still numb, more on the left but also on the right. She denies weakness, no weakness in the hands or dropping objects. Her neck and shoulder are sore and the arm feels a dull ache. Nothing makes it better or worse. Symptoms are continuous. No burning, no tingling in the arm but the arm feels numb. There is tingling in the face. Continuous all day long. No facial droop, no weakness in the face, no dysarthria or aphasia, no dysphagia, no gait changes, no bowel or bladder changes. The pain is 3-4/10 all day long. No FHx of neuromuscular dz or neuropathy. No headache, vision changes, weakness or other focal neurologic symptoms. No inciting factors, no trauma or injury.   Reviewed notes, labs and imaging from outside physicians, which showed:  CT of the head 12/12/2015:   Ct showed No acute intracranial abnormalities including mass lesion or mass effect, hydrocephalus, extra-axial fluid collection, midline shift, hemorrhage, or acute infarction, large ischemic events (personally reviewed images)  MRI of the brain was normal (personally reviewed images). MRi of the c-spine most significant for moderate to severe left foraminal narrowing at C5-6,   BMP unrmarkable  Patient was seen in the ED 12/12/2015 for left facial  numbness and then left upper extremity proximal arm weakness. Ct was negative. Patient had dec sensation on the face.     Review of Systems: Patient complains of symptoms per HPI as well as the following symptoms: No CP, no SOB Pertinent negatives per HPI. All others negative.   Social History   Social History  . Marital Status: Married    Spouse Name: Legrand Como  . Number of Children: 2  . Years of Education: 16   Occupational History  . Not on file.   Social History Main Topics  . Smoking status: Never Smoker   . Smokeless tobacco: Not on file  . Alcohol Use: 0.0 oz/week    0 Standard drinks or equivalent per week     Comment: some wine  . Drug Use: No  . Sexual Activity: Not on file   Other Topics Concern  . Not on file   Social History Narrative   Lives with husband and 2 kids   Caffeine use: Drinks 2-3 cups coffee per day    Family History  Problem Relation Age of Onset  . Memory loss Mother     Past Medical History  Diagnosis Date  . Asthma   . Diabetes mellitus without complication ([place])   . Hypertension     Past Surgical History  Procedure Laterality Date  . Appendectomy  1985    Current Outpatient Prescriptions  Medication Sig Dispense Refill  . albuterol (PROVENTIL HFA;VENTOLIN HFA) 108 (90 BASE) MCG/ACT inhaler Inhale 1 puff into the lungs every  6 (six) hours as needed for wheezing or shortness of breath.    Marland Kitchen amLODipine (NORVASC) 5 MG tablet Take 5 mg by mouth daily.    Marland Kitchen losartan (COZAAR) 100 MG tablet Take 100 mg by mouth daily.    Glory Rosebush DELICA LANCETS [id number] MISC USE AS DIRECTED TO TEST BLOOD SUGARS DAILY IN VITRO 90 DAYS  2   No current facility-administered medications for this visit.    Allergies as of 12/17/2015 - Review Complete 12/17/2015  Allergen Reaction Noted  . Codeine  12/12/2015    Vitals: BP 134/85 mmHg  Pulse 70  Ht 5\' 4"  (1.626 m)  Wt 192 lb 12.8 oz (87.454 kg)  BMI 33.08 kg/m2  LMP 12/10/2015 Last Weight:  Wt  Readings from Last 1 Encounters:  12/17/15 192 lb 12.8 oz (87.454 kg)   Last Height:   Ht Readings from Last 1 Encounters:  12/17/15 5\' 4"  (1.626 m)   Physical exam: Exam: Gen: NAD, conversant, well nourised, obese, well groomed                     CV: RRR, no MRG. No Carotid Bruits. No peripheral edema, warm, nontender Eyes: Conjunctivae clear without exudates or hemorrhage Sore to palpation more in the upper arm.   Neuro: Detailed Neurologic Exam  Speech:    Speech is normal; fluent and spontaneous with normal comprehension.  Cognition:    The patient is oriented to person, place, and time;     recent and remote memory intact;     language fluent;     normal attention, concentration,     fund of knowledge Cranial Nerves: Decreased mostly left V3 distribution    The pupils are equal, round, and reactive to light. The fundi are normal and spontaneous venous pulsations are present. Visual fields are full to finger confrontation. Extraocular movements are intact. Trigeminal sensation is intact and the muscles of mastication are normal. The face is symmetric. The palate elevates in the midline. Hearing intact. Voice is normal. Shoulder shrug is normal. The tongue has normal motion without fasciculations.   Coordination:    Normal finger to nose and heel to shin.   Gait:    Heel-toe and tandem gait are normal.   Motor Observation:    No asymmetry, no atrophy, and no involuntary movements noted. Tone:    Normal muscle tone.    Posture:    Posture is normal. normal erect    Strength:    Strength is V/V in the upper and lower limbs.  Intact intrinsic muscles of the hands.      Sensation: Decreased in the left face and in the deltoid area of the left arm.      Reflex Exam:  DTR's:    Deep tendon reflexes in the upper and lower extremities are brisk bilaterally.   Toes:    The toes are downgoing bilaterally.   Clonus:    Clonus is absent.       Assessment/Plan:  50  year old with acute onset left facial numbness then left proximal arm pain without weakness. MRi of the brain normal, MRI c-spine most significant for moderate to severe left foraminal narrowing at C5-6. symptoms of upper arm and shoulder pain are consistent with the MRI findings of moderate to severe C5-C6 foraminal stenosis. Will order EMG/NCS.  Unclear etiology of left facial numbness, MR of the brain was normal, may be unrelated and coincidental.   CC: Dr. Phillis Knack  Jaynee Eagles, [facility] [address] [place], [place] [address]  Phone [phone] Fax [phone]

## 2015-12-17 NOTE — Patient Instructions (Signed)
Remember to drink plenty of fluid, eat healthy meals and do not skip any meals. Try to eat protein with a every meal and eat a healthy snack such as fruit or nuts in between meals. Try to keep a regular sleep-wake schedule and try to exercise daily, particularly in the form of walking, 20-30 minutes a day, if you can.   As far as your medications are concerned, I would like to suggest: Flexeril up to three times a day  As far as diagnostic testing: emg/ncs  I would like to see you back first avail emg/ncs, sooner if we need to. Please call us with any interim questions, concerns, problems, updates or refill requests.   Our phone number is [phone]. We also have an after hours call service for urgent matters and there is a physician on-call for urgent questions. For any emergencies you know to call 911 or go to the nearest emergency room

## 2015-12-19 ENCOUNTER — Ambulatory Visit (INDEPENDENT_AMBULATORY_CARE_PROVIDER_SITE_OTHER): Payer: Self-pay | Admitting: Neurology

## 2015-12-19 ENCOUNTER — Ambulatory Visit (INDEPENDENT_AMBULATORY_CARE_PROVIDER_SITE_OTHER): Payer: 59 | Admitting: Neurology

## 2015-12-19 DIAGNOSIS — M79602 Pain in left arm: Secondary | ICD-10-CM

## 2015-12-19 DIAGNOSIS — M50122 Cervical disc disorder at C5-C6 level with radiculopathy: Secondary | ICD-10-CM | POA: Diagnosis not present

## 2015-12-19 DIAGNOSIS — Z0289 Encounter for other administrative examinations: Secondary | ICD-10-CM

## 2015-12-19 DIAGNOSIS — M5010 Cervical disc disorder with radiculopathy, unspecified cervical region: Secondary | ICD-10-CM

## 2015-12-19 MED ORDER — GABAPENTIN 100 MG PO CAPS: 100.0000 mg | ORAL_CAPSULE | Freq: Three times a day (TID) | ORAL | Status: DC

## 2015-12-19 NOTE — Progress Notes (Signed)
See procedure note.

## 2015-12-19 NOTE — Procedures (Signed)
  [facility]    Provider:  Dr Jaynee Eagles Referring Provider: Harlan Stains, MD Primary Care Physician:  Vidal Schwalbe, MD  HPI:  Jessica Ponce is a 50 y.o. female here as a referral from Dr. Dema Severin for Left-sided numbness. It started in the last year with some numbness in the face. Thursday at 1pm she acute onset symptoms, no inciting events, it felt like novacaine on the face, more on the left side of the face but also on the right side of the face, she had shoulder pain and radiation into the left arm. Felt weird. Symptoms have been ongoing since then. She has an ache in her left shoulder and left arm. The face is still numb, more on the left but also on the right. She denies weakness, no weakness in the hands or dropping objects. Her neck and shoulder are sore and the arm feels a dull ache. Nothing makes it better or worse. Symptoms are continuous. No burning, no tingling in the arm but the arm feels numb. There is tingling in the face. Continuous all day long. No facial droop, no weakness in the face, no dysarthria or aphasia, no dysphagia, no gait changes, no bowel or bladder changes. The pain is 3-4/10 all day long. Pain in the arm is more proximal.   MRI CERVICAL SPINE IMPRESSION:  1. No acute abnormality within the cervical spine. 2. Reversal of the normal cervical lordosis with moderate multilevel degenerative spondylolysis as above. There is resultant moderate to severe left foraminal narrowing at C5-6, with more moderate left foraminal stenosis at C6-7, C7-T1 and C2-3. Moderate to severe right foraminal stenosis at C6-7, with more moderate right foraminal narrowing at C5-6. Mild canal stenosis at C4-5 through C6-7.  Summary: Nerve Conduction studies were performed on the bilateral upper extremities: ADM Ulnar and APB Median motor conductions were within normal limits with normal F wave latencies.  2nd-digit Median, 5th-digit Ulnar and Radial sensory conductions were  within normal limits.   EMG needle evaluation was performed on selected left upper extremity muscles: The left Deltoid, left Triceps, left Biceps,, left Pronator Teres, left First Dorsal Interoseous, left Opponens Pollicis, left Supraspinatus and Infraspinatus and left C6/C7/C8 paraspinal muscles were normal.  Conclusion: This is a normal study. No electrophysiologic evidence for median or ulnar neuropathy or cervical radiculopathy. However patient's symptoms of upper arm and shoulder pain are consistent with the MRI findings of moderate to severe C5-C6 foraminal stenosis and I would suspect her radiculopathy may not have been detected on this emg.  Georgia Dom, MD  [facility] [address] [place], [place] [address]  Phone [phone] Fax [phone]

## 2015-12-19 NOTE — Progress Notes (Signed)
  [facility]    Provider:  Dr Jaynee Eagles Referring Provider: Harlan Stains, MD Primary Care Physician:  Vidal Schwalbe, MD  HPI:  Jessica Ponce is a 50 y.o. female here as a referral from Dr. Dema Severin for Left-sided numbness. It started in the last year with some numbness in the face. Thursday at 1pm she acute onset symptoms, no inciting events, it felt like novacaine on the face, more on the left side of the face but also on the right side of the face, she had shoulder pain and radiation into the left arm. Felt weird. Symptoms have been ongoing since then. She has an ache in her left shoulder and left arm. The face is still numb, more on the left but also on the right. She denies weakness, no weakness in the hands or dropping objects. Her neck and shoulder are sore and the arm feels a dull ache. Nothing makes it better or worse. Symptoms are continuous. No burning, no tingling in the arm but the arm feels numb. There is tingling in the face. Continuous all day long. No facial droop, no weakness in the face, no dysarthria or aphasia, no dysphagia, no gait changes, no bowel or bladder changes. The pain is 3-4/10 all day long. Pain in the arm is more proximal.   MRI CERVICAL SPINE IMPRESSION:  1. No acute abnormality within the cervical spine. 2. Reversal of the normal cervical lordosis with moderate multilevel degenerative spondylolysis as above. There is resultant moderate to severe left foraminal narrowing at C5-6, with more moderate left foraminal stenosis at C6-7, C7-T1 and C2-3. Moderate to severe right foraminal stenosis at C6-7, with more moderate right foraminal narrowing at C5-6. Mild canal stenosis at C4-5 through C6-7.  Summary: Nerve Conduction studies were performed on the bilateral upper extremities: ADM Ulnar and APB Median motor conductions were within normal limits with normal F wave latencies.  2nd-digit Median, 5th-digit Ulnar and Radial sensory conductions were  within normal limits.   EMG needle evaluation was performed on selected left upper extremity muscles: The left Deltoid, left Triceps, left Biceps,, left Pronator Teres, left First Dorsal Interoseous, left Opponens Pollicis, left Supraspinatus and Infraspinatus and left C6/C7/C8 paraspinal muscles were normal.  Conclusion: This is a normal study. No electrophysiologic evidence for median or ulnar neuropathy or cervical radiculopathy. However patient's symptoms of upper arm and shoulder pain are consistent with the MRI findings of moderate to severe C5-C6 foraminal stenosis and I would suspect her radiculopathy may not have been detected on this emg.  Georgia Dom, MD  [facility] [address] [place], [place] [address]  Phone [phone] Fax [phone]

## 2015-12-25 DIAGNOSIS — R200 Anesthesia of skin: Secondary | ICD-10-CM | POA: Insufficient documentation

## 2015-12-25 DIAGNOSIS — M79602 Pain in left arm: Secondary | ICD-10-CM | POA: Insufficient documentation

## 2016-01-08 ENCOUNTER — Ambulatory Visit: Payer: 59 | Admitting: Physical Therapy

## 2016-01-10 ENCOUNTER — Ambulatory Visit: Payer: 59 | Admitting: Physical Therapy

## 2016-05-06 ENCOUNTER — Telehealth: Payer: Self-pay | Admitting: Neurology

## 2016-05-06 NOTE — Telephone Encounter (Signed)
Records faxed to [facility] [place] [facility]

## 2016-05-07 NOTE — Telephone Encounter (Signed)
Records sent to [facility] [place] [facility]

## 2016-07-31 DIAGNOSIS — I10 Essential (primary) hypertension: Secondary | ICD-10-CM | POA: Insufficient documentation

## 2017-02-02 IMAGING — MR MR HEAD W/O CM
8 of 11 series · 32 of 48 positions shown · non-contrast
Comparison: None.

CLINICAL DATA: Initial evaluation for left-sided facial and upper
extremity numbness.

EXAM:
MRI HEAD WITHOUT CONTRAST
MRI CERVICAL SPINE WITHOUT CONTRAST
TECHNIQUE: Multiplanar, multiecho pulse sequences of the brain and surrounding
structures, and cervical spine, to include the craniocervical
junction and cervicothoracic junction, were obtained without
intravenous contrast.

[Series 2: FLAIR · sagittal · 5.0mm · 0.47mm/px · 2 of 23 slices shown (1 of 2)]
[im 1/23]
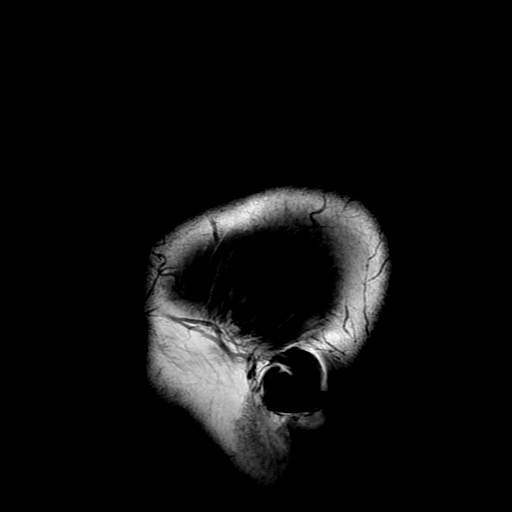
[im 23/23]
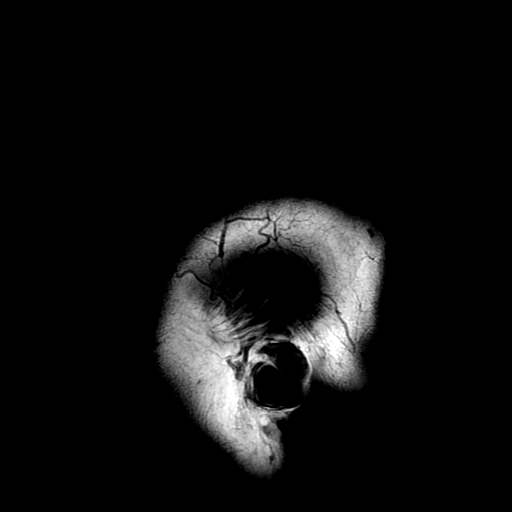

[Series 4: DWI · axial · 3.0mm · 0.94mm/px · z∈[-116,+20]mm · 9 of 94 slices shown (1 of 4)]
[im 1/94]
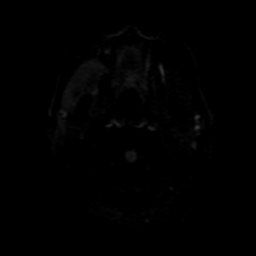
[im 12/94]
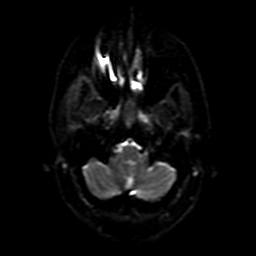
[im 24/94]
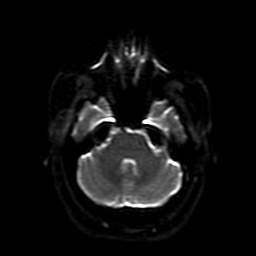
[im 35/94]
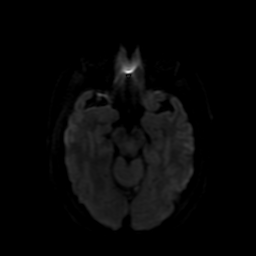
[im 47/94]
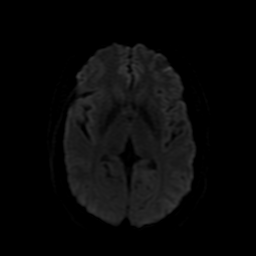
[im 59/94]
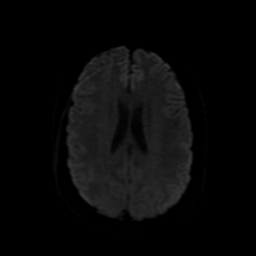
[im 70/94]
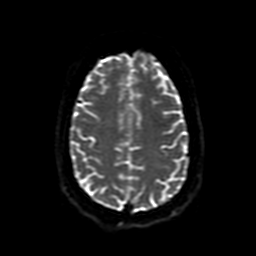
[im 82/94]
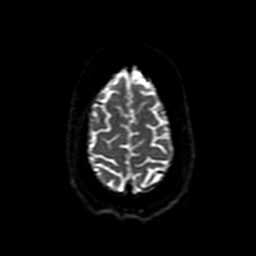
[im 94/94]
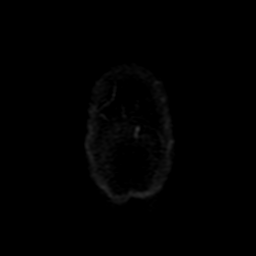

[Series 5: DWI · coronal · 4.0mm · 0.94mm/px · 7 of 70 slices shown (2 of 4)]
[im 1/70]
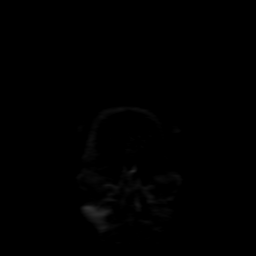
[im 12/70]
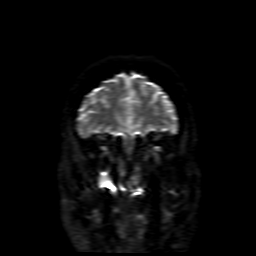
[im 24/70]
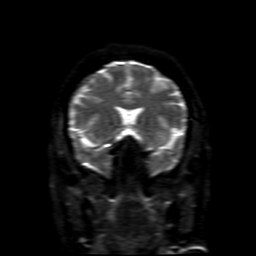
[im 35/70]
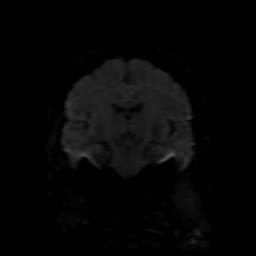
[im 47/70]
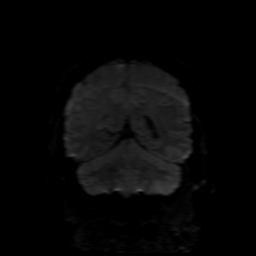
[im 58/70]
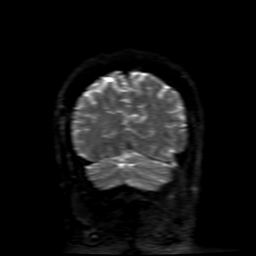
[im 70/70]
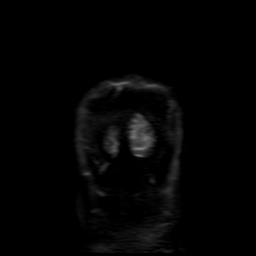

[Series 6: T2 · axial · 5.0mm · 0.47mm/px · z∈[-122,+14]mm · 2 of 24 slices shown (1 of 2)]
[im 1/24]
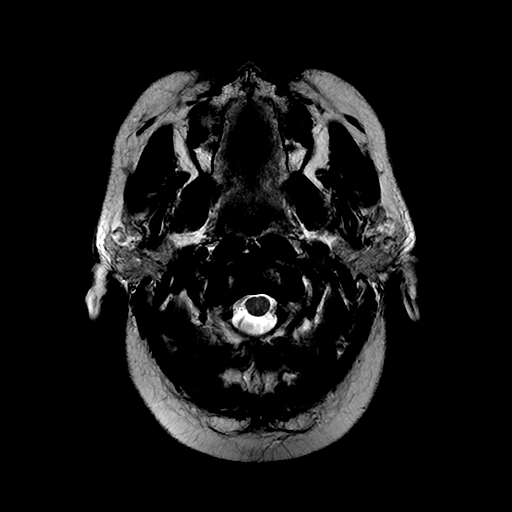
[im 24/24]
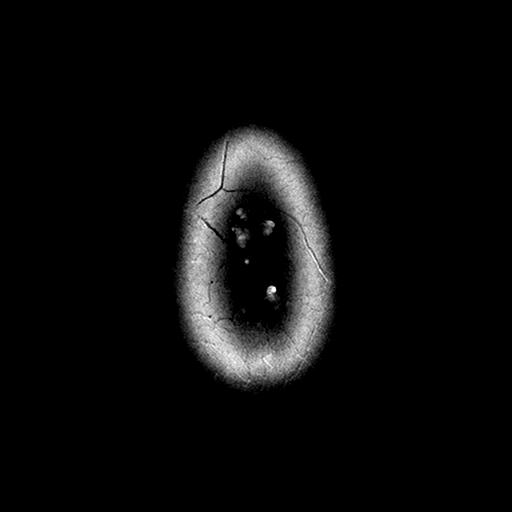

[Series 7: FLAIR · axial · 5.0mm · 0.47mm/px · z∈[-122,+14]mm · 2 of 24 slices shown (2 of 2)]
[im 1/24]
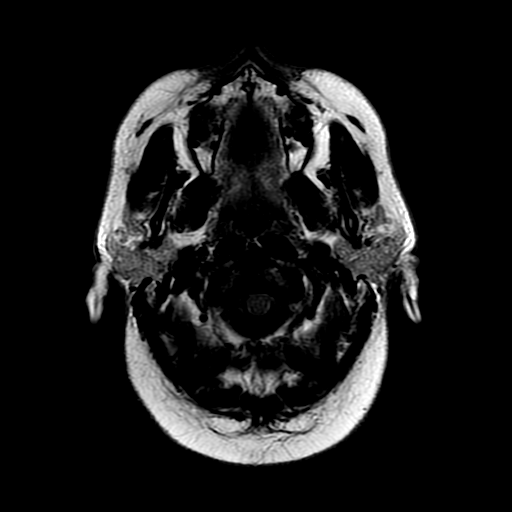
[im 24/24]
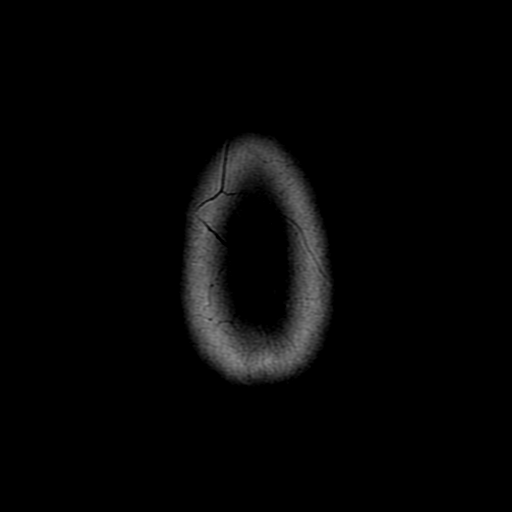

[Series 10: T2 · coronal · 5.0mm · 0.47mm/px · 3 of 29 slices shown (2 of 2)]
[im 1/29]
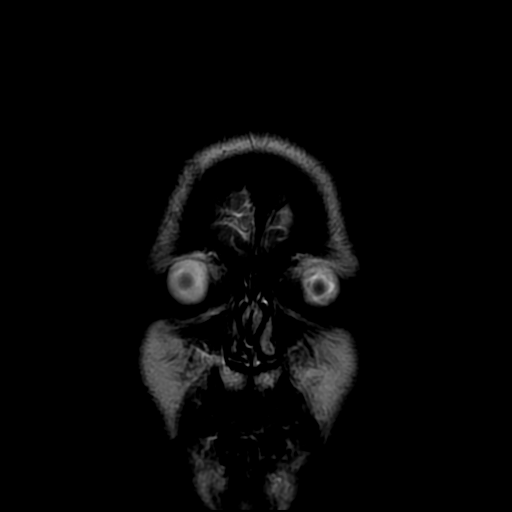
[im 15/29]
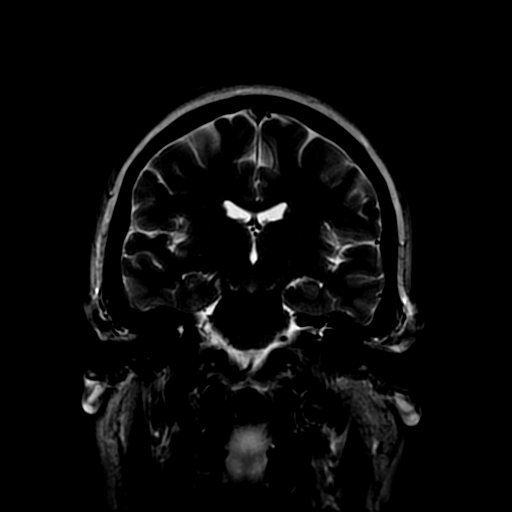
[im 29/29]
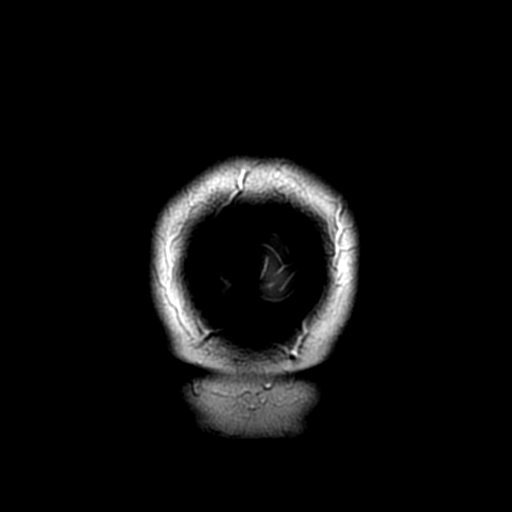

[Series 400: DWI · axial · 3.0mm · 0.94mm/px · z∈[-116,+20]mm · 4 of 47 slices shown (3 of 4)]
[im 1/47]
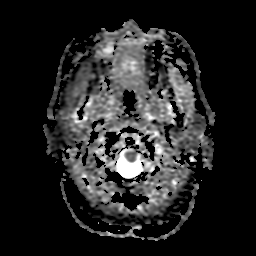
[im 16/47]
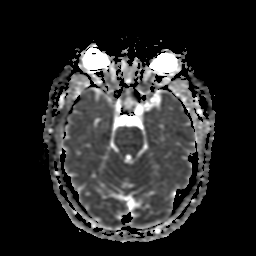
[im 31/47]
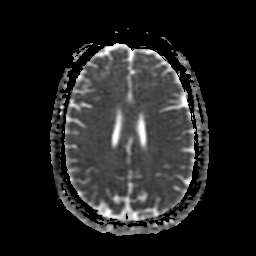
[im 47/47]
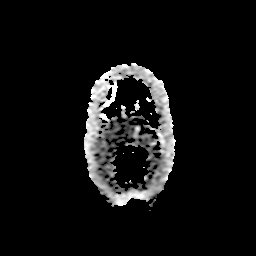

[Series 500: DWI · coronal · 4.0mm · 0.94mm/px · 3 of 35 slices shown (4 of 4)]
[im 1/35]
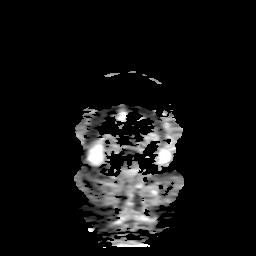
[im 18/35]
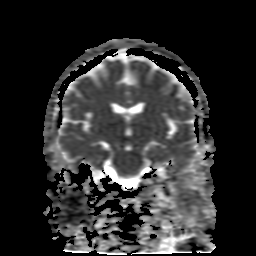
[im 35/35]
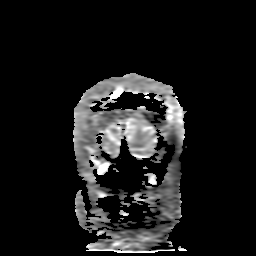

[32 of 48 positions shown; findings below may reference images not displayed]

FINDINGS: MRI HEAD FINDINGS

The CSF containing spaces are within normal limits for patient age.
No focal parenchymal signal abnormality is identified. No mass
lesion, midline shift, or extra-axial fluid collection. Ventricles
are normal in size without evidence of hydrocephalus.

No diffusion-weighted signal abnormality is identified to suggest
acute intracranial infarct. Gray-white matter differentiation is
maintained. Normal flow voids are seen within the intracranial
vasculature. No intracranial hemorrhage identified.

The cervicomedullary junction is normal. Pituitary gland is within
normal limits. Pituitary stalk is midline. The globes and optic
nerves demonstrate a normal appearance with normal signal intensity.

The bone marrow signal intensity is normal. Calvarium is intact.
Visualized upper cervical spine is within normal limits.

Scalp soft tissues are unremarkable.

Polypoid opacity present within the inferior right maxillary sinus.
Paranasal sinuses are otherwise clear. No mastoid effusion.

MRI CERVICAL SPINE FINDINGS

Mild reversal of the normal cervical lordosis with apex at C5. Trace
anterolisthesis of C4 on C5, with trace retrolisthesis of C5 on C6.
Dens is somewhat angulated posteriorly. There is secondary mild
narrowing at the craniocervical junction without frank impingement
upon the brainstem.

Vertebral body heights maintained. No fracture. Signal intensity
within the vertebral body bone marrow is somewhat heterogeneous
without focal lesion. No marrow edema.

Signal intensity within the cervical spinal cord is normal.

No acute paraspinous soft tissue abnormality.

C2-C3: Broad-based posterior disc bulge with mild bilateral
uncovertebral spurring. Left worse than right facet arthrosis.
Resultant moderate left foraminal narrowing with more mild right
foraminal stenosis. No canal stenosis.

C3-C4: No significant disc bulge. Mild bilateral uncovertebral
spurring. Left worse than right facet arthrosis. No significant
canal or foraminal stenosis.

C4-C5: Trace anterolisthesis of C4 on C5. Broad-based posterior disc
bulge, a centric to the right. There is flattening with partial
effacement of the ventral thecal sac with minimal flattening of the
ventral cord. No cord signal changes. Superimposed bilateral
uncovertebral hypertrophy and left worse than right facet arthrosis.
Resultant moderate left with more mild right foraminal stenosis.
Very mild canal narrowing.

C5-C6: Trace retrolisthesis of C5 on C6. Diffuse disc bulge with
bilateral uncovertebral spurring, left worse than right. Mild
left-sided facet arthrosis. Left foraminal disc osteophyte complex
results in moderate to severe left foraminal narrowing. There is
moderate right foraminal stenosis as well. Posterior disc osteophyte
flattens and partially effaces the ventral thecal sac results in
mild canal stenosis.

C6-C7: Circumferential disc bulge with mild bilateral uncovertebral
hypertrophy. Mild ligamentum flavum thickening. Moderate to severe
right foraminal narrowing with more moderate left foraminal
stenosis. Mild canal stenosis.

C7-T1: Posterior and left foraminal broad-based disc bulge with
superimposed left uncovertebral spurring. This left foraminal disc
osteophyte complex results in moderate left foraminal narrowing. No
significant right foraminal or canal stenosis.

Degenerative disc bulge present at T2-3 without significant
stenosis. Remainder of the visualized upper thoracic spine within
normal limits.
IMPRESSION: MRI HEAD IMPRESSION:

Normal brain MRI.

MRI CERVICAL SPINE IMPRESSION:

1. No acute abnormality within the cervical spine.
2. Reversal of the normal cervical lordosis with moderate multilevel
degenerative spondylolysis as above. There is resultant moderate to
severe left foraminal narrowing at C5-6, with more moderate left
foraminal stenosis at C6-7, C7-T1 and C2-3. Moderate to severe right
foraminal stenosis at C6-7, with more moderate right foraminal
narrowing at C5-6. Mild canal stenosis at C4-5 through C6-7.

## 2019-08-23 ENCOUNTER — Ambulatory Visit
Admission: EM | Admit: 2019-08-23 | Discharge: 2019-08-23 | Disposition: A | Discharge status: Home / Self Care | Payer: BC Managed Care – PPO

## 2019-08-23 DIAGNOSIS — I10 Essential (primary) hypertension: Secondary | ICD-10-CM

## 2019-08-23 DIAGNOSIS — Z760 Encounter for issue of repeat prescription: Secondary | ICD-10-CM | POA: Diagnosis not present

## 2019-08-23 DIAGNOSIS — H00011 Hordeolum externum right upper eyelid: Secondary | ICD-10-CM

## 2019-08-23 MED ORDER — ERYTHROMYCIN 5 MG/GM OP OINT: TOPICAL_OINTMENT | OPHTHALMIC | 0 refills | Status: DC

## 2019-08-23 MED ORDER — LOSARTAN POTASSIUM 100 MG PO TABS: 100.0000 mg | ORAL_TABLET | Freq: Every day | ORAL | 0 refills | Status: DC

## 2019-08-23 MED ORDER — AMLODIPINE BESYLATE 10 MG PO TABS: 10.0000 mg | ORAL_TABLET | Freq: Every day | ORAL | 0 refills | Status: DC

## 2019-08-23 NOTE — ED Triage Notes (Signed)
Pt c/o style to rt upper eye lid since Monday , redness and swelling noted

## 2019-08-23 NOTE — Discharge Instructions (Signed)
Use erythromycin as directed. Refilled blood pressure medications today. Information attached for PCP offices for follow up and further evaluation of high blood pressure.

## 2019-08-23 NOTE — ED Provider Notes (Signed)
[name]    CSN: [id number] Arrival date & time: 08/23/19  1727      History   Chief Complaint Chief Complaint  Patient presents with  . Stye    HPI Jessica Ponce is a 53 y.o. female.   53 yo female with hx of HTN present with redness and pain of her right upper eyelid x 3 days. The redness, pain, and swelling increased significantly yesterday. Some tenderness under right eye. Denies drainage or foreign body senstation. Denies changes in vision. Denies fever or URI symptoms.   She has not been on blood pressure medication since Nov of 2019. She had a change of insurance in Jan of 2020 and her PCP does not accept her new insurance. She has a BP monitor at home but has not checked it in months. Denies CP, SOB, one sided weakness. Denies HA, vision changes. Denies cough, orthopnea, or edema.      Past Medical History:  Diagnosis Date  . Asthma   . Diabetes mellitus without complication ([place])   . Hypertension     Patient Active Problem List   Diagnosis Date Noted  . Left arm pain 12/25/2015  . Left facial numbness 12/25/2015  . Pneumonia 07/20/2012    Past Surgical History:  Procedure Laterality Date  . APPENDECTOMY  1985    OB History   No obstetric history on file.      Home Medications    Prior to Admission medications   Medication Sig Start Date End Date Taking? Authorizing Provider  albuterol (PROVENTIL HFA;VENTOLIN HFA) 108 (90 BASE) MCG/ACT inhaler Inhale 1 puff into the lungs every 6 (six) hours as needed for wheezing or shortness of breath.    [provider]  amLODipine (NORVASC) 10 MG tablet Take 1 tablet (10 mg total) by mouth daily. 08/23/19   Tasia Catchings, Nargis Abrams V, PA-C  erythromycin ophthalmic ointment Place a 1/2 inch ribbon of ointment onto the upper eyelid 4 times a day. 08/23/19   Tasia Catchings, Keyshawn Hellwig V, PA-C  losartan (COZAAR) 100 MG tablet Take 1 tablet (100 mg total) by mouth daily. 08/23/19   Ok Edwards, PA-C    Family History Family History   Problem Relation Age of Onset  . Memory loss Mother     Social History Social History   Tobacco Use  . Smoking status: Never Smoker  . Smokeless tobacco: Never Used  Substance Use Topics  . Alcohol use: Yes    Alcohol/week: 0.0 standard drinks    Comment: some wine  . Drug use: No     Allergies   Codeine   Review of Systems Review of Systems  See HPI.    Physical Exam Triage Vital Signs ED Triage Vitals [08/23/19 1741]  Enc Vitals Group     BP (!) 204/143     Pulse Rate (!) 102     Resp 18     Temp 98.3 F (36.8 C)     Temp Source Oral     SpO2 95 %     Weight      Height      Head Circumference      Peak Flow      Pain Score 3     Pain Loc      Pain Edu?      Excl. in [place]?    No data found.  Updated Vital Signs BP (!) 204/143 (BP Location: Left Arm)   Pulse (!) 102   Temp 98.3 F (36.8  C) (Oral)   Resp 18   SpO2 95%   Physical Exam Constitutional:      General: She is not in acute distress.    Appearance: Normal appearance. She is not ill-appearing or toxic-appearing.  HENT:     Nose: Nose normal.     Mouth/Throat:     Mouth: Mucous membranes are moist.     Pharynx: Oropharynx is clear. No oropharyngeal exudate or posterior oropharyngeal erythema.  Eyes:     General:        Right eye: No discharge.        Left eye: No discharge.     Extraocular Movements: Extraocular movements intact.     Conjunctiva/sclera: Conjunctivae normal.     Pupils: Pupils are equal, round, and reactive to light.     Comments: Right eye: Swelling and erythema of upper lid. Hordeolum noted to upper lid at lateral corner under lash line. No surrounding erythema. No pain with EOMs. Tenderness to palpation along right cheek.   Neurological:     Mental Status: She is alert.      UC Treatments / Results  Labs (all labs ordered are listed, but only abnormal results are displayed) Labs Reviewed - No data to display  EKG   Radiology No results found.   Procedures Procedures (including critical care time)  Medications Ordered in UC Medications - No data to display  Initial Impression / Assessment and Plan / UC Course  I have reviewed the triage vital signs and the nursing notes.  Pertinent labs & imaging results that were available during my care of the patient were reviewed by me and considered in my medical decision making (see chart for details).   Patient presents with BP of 204/143 at triage. She is asymptomatic without CP, SOB, vision changes, or one sided weakness. GoodRx coupons given for 90 supply of BP medications. Patient to monitor BP at home. Information given for PCP offices for follow up of BP and further medication refills.    No signs of periorbital cellulitis at this time. Treat hordeolum with erythromycin ointment, lid scrubs, and warm compressions. Return precautions given. Patient agrees with plan.    Final Clinical Impressions(s) / UC Diagnoses   Final diagnoses:  Hordeolum externum of right upper eyelid  Medication refill   ED Prescriptions    Medication Sig Dispense Auth. Provider   erythromycin ophthalmic ointment Place a 1/2 inch ribbon of ointment onto the upper eyelid 4 times a day. 1 g Andrian Sabala V, PA-C   amLODipine (NORVASC) 10 MG tablet Take 1 tablet (10 mg total) by mouth daily. 90 tablet Almee Pelphrey V, PA-C   losartan (COZAAR) 100 MG tablet Take 1 tablet (100 mg total) by mouth daily. 90 tablet Ok Edwards, PA-C     PDMP not reviewed this encounter.   Ok Edwards, PA-C 08/23/19 1913

## 2020-02-29 ENCOUNTER — Encounter: Payer: Self-pay | Admitting: Internal Medicine

## 2020-03-11 ENCOUNTER — Telehealth (INDEPENDENT_AMBULATORY_CARE_PROVIDER_SITE_OTHER): Payer: BC Managed Care – PPO | Admitting: Internal Medicine

## 2020-03-11 DIAGNOSIS — Z7689 Persons encountering health services in other specified circumstances: Secondary | ICD-10-CM

## 2020-03-11 DIAGNOSIS — I10 Essential (primary) hypertension: Secondary | ICD-10-CM

## 2020-03-11 DIAGNOSIS — G4700 Insomnia, unspecified: Secondary | ICD-10-CM | POA: Diagnosis not present

## 2020-03-11 DIAGNOSIS — E119 Type 2 diabetes mellitus without complications: Secondary | ICD-10-CM | POA: Diagnosis not present

## 2020-03-11 DIAGNOSIS — F411 Generalized anxiety disorder: Secondary | ICD-10-CM

## 2020-03-11 DIAGNOSIS — Z803 Family history of malignant neoplasm of breast: Secondary | ICD-10-CM | POA: Insufficient documentation

## 2020-03-11 MED ORDER — SERTRALINE HCL 25 MG PO TABS: ORAL_TABLET | ORAL | 1 refills | Status: DC

## 2020-03-11 MED ORDER — AMLODIPINE BESYLATE 10 MG PO TABS: 10.0000 mg | ORAL_TABLET | Freq: Every day | ORAL | 0 refills | Status: DC

## 2020-03-11 MED ORDER — LOSARTAN POTASSIUM 100 MG PO TABS: 100.0000 mg | ORAL_TABLET | Freq: Every day | ORAL | 0 refills | Status: DC

## 2020-03-11 NOTE — Progress Notes (Signed)
Virtual Visit via Telephone Note  I connected with Jessica Ponce, on 03/11/2020 at 2:48 PM by telephone due to the COVID-19 pandemic and verified that I am speaking with the correct person using two identifiers.   Consent: I discussed the limitations, risks, security and privacy concerns of performing an evaluation and management service by telephone and the availability of in person appointments. I also discussed with the patient that there may be a patient responsible charge related to this service. The patient expressed understanding and agreed to proceed.   Location of Patient: Home   Location of Provider: Home    Persons participating in Telemedicine visit: Emilynn Hessing [facility] Dr. Juleen China      History of Present Illness: Patient has a visit to establish care. Was previously seen by [facility]--they no longer take her insurance. Was followed by her PCP for 20 years. 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   Chronic HTN Disease Monitoring:  Home BP Monitoring - Has not been monitoring due to broken cuff.  Chest pain- no  Dyspnea- no Headache - no  Medications: Amlodipine 10 mg, Losartan 100 mg  Compliance- yes  Lightheadedness- no   Edema- yes, but only when traveling long distances    T2DM: Was controlled prior with diet. She is not sure where she is with that because reports she got off track with COVID. She has not checked her glucose recently due to running out of test strips. Her last A1c would have been checked >1 year ago per patient report. Reports that with Metformin she always had GI upset despite trying different doses.   Reports since she hit menopause, sleeping has been an issue. She is not sure whether this is due to menopause or anxiety. When she can't sleep she has been taking OTC Unisom and that has been helping. Thinks that is getting a little bit better now.   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 Stressed about her daughter and her schooling--daughter had anxiety about virtual learning. Not sure if she can get her re-enrolled in the gifted program.    Past Medical History:  Diagnosis Date  . Asthma   . Essential hypertension    Allergies  Allergen Reactions  . Valsartan Diarrhea  . Atorvastatin Other (See Comments)    Elevated sugars  . Codeine Nausea Only    vomiting vomitting    Current Outpatient Medications on File Prior to Visit  Medication Sig Dispense Refill  . albuterol (PROVENTIL HFA;VENTOLIN HFA) 108 (90 BASE) MCG/ACT inhaler Inhale 1 puff into the lungs every 6 (six) hours as needed for wheezing or shortness of breath.    Marland Kitchen amLODipine (NORVASC) 10 MG tablet Take 1 tablet (10 mg total) by mouth daily. 90 tablet 0  . losartan (COZAAR) 100 MG tablet Take 1 tablet (100 mg total) by mouth daily. 90 tablet 0   No current facility-administered medications on file prior to visit.    Observations/Objective: NAD. Speaking clearly.  Work of breathing normal.  Alert and oriented. Mood appropriate.   Assessment and Plan: 1. Encounter to establish care  2. Essential hypertension Does not monitor--plans to buy a new cuff. Will check BP at her annual exam. Continue current medication regimen. Will need to check BMET given use of ARB.  - amLODipine (NORVASC) 10 MG tablet; Take 1 tablet (10 mg total) by mouth daily.  Dispense: 90 tablet; Refill: 0 - losartan (  COZAAR) 100 MG tablet; Take 1 tablet (100 mg total) by mouth daily.  Dispense: 90 tablet; Refill: 0  3. Type 2 diabetes mellitus without complication, without long-term current use of insulin (HCC) Previously diet controlled. Will check A1c at her upcoming in person exam. If elevated, she would like to do 3 month trial of exercise/diet.   4. Anxiety state Situational with pandemic, change in insurance, worries about children, financial stressors. Patient requested trial of  medication. Will start Zoloft. Discussed potential side effects and time course needed to achieve benefit of therapy. Also discussed option of seeing Christa See, LCSW for behavioral health counseling.  - sertraline (ZOLOFT) 25 MG tablet; Take one tablet daily. If tolerating, increase to two tablets in one week.  Dispense: 60 tablet; Refill: 1  5. Insomnia, unspecified type Suspect related to anxiety state. Will monitor with addition of Zoloft.    Follow Up Instructions: Annual exam with PAP and labs    I discussed the assessment and treatment plan with the patient. The patient was provided an opportunity to ask questions and all were answered. The patient agreed with the plan and demonstrated an understanding of the instructions.   The patient was advised to call back or seek an in-person evaluation if the symptoms worsen or if the condition fails to improve as anticipated.     I provided 28 minutes total of non-face-to-face time during this encounter including median intraservice time, reviewing previous notes, investigations, ordering medications, medical decision making, coordinating care and patient verbalized understanding at the end of the visit.    Phill Myron, D.O. Primary Care at [facility]  03/11/2020, 2:48 PM

## 2020-03-12 ENCOUNTER — Other Ambulatory Visit: Payer: Self-pay | Admitting: Internal Medicine

## 2020-03-12 DIAGNOSIS — E119 Type 2 diabetes mellitus without complications: Secondary | ICD-10-CM

## 2020-03-12 MED ORDER — BAYER CONTOUR LINK 2.4 W/DEVICE KIT: 1.0000 | PACK | Freq: Every day | 0 refills | Status: DC | PRN

## 2020-03-12 MED ORDER — MICROLET LANCETS MISC: 1.0000 "application " | Freq: Every day | 0 refills | Status: DC | PRN

## 2020-03-12 MED ORDER — GLUCOSE BLOOD VI STRP: ORAL_STRIP | 12 refills | Status: DC

## 2020-03-12 MED ORDER — MICROLET NEXT LANCING DEVICE MISC: 1.0000 | Freq: Every day | 0 refills | Status: DC | PRN

## 2020-03-12 NOTE — Progress Notes (Signed)
Sent in diabetic supplies for patient that are covered by [employer].   Phill Myron, D.O. Primary Care at [facility]  03/12/2020, 2:23 PM

## 2020-03-28 ENCOUNTER — Encounter: Payer: BC Managed Care – PPO | Admitting: Internal Medicine

## 2020-04-18 ENCOUNTER — Telehealth: Payer: Self-pay

## 2020-04-18 NOTE — Telephone Encounter (Signed)
Called patient to do their pre-visit COVID screening.  Call went to voicemail. Unable to do prescreening.  

## 2020-04-18 NOTE — Patient Instructions (Signed)

## 2020-04-19 ENCOUNTER — Ambulatory Visit (INDEPENDENT_AMBULATORY_CARE_PROVIDER_SITE_OTHER): Payer: 59 | Admitting: Internal Medicine

## 2020-04-19 ENCOUNTER — Other Ambulatory Visit (HOSPITAL_COMMUNITY)
Admission: RE | Admit: 2020-04-19 | Discharge: 2020-04-19 | Disposition: A | Discharge status: Home / Self Care | Payer: 59 | Source: Ambulatory Visit | Attending: Internal Medicine | Admitting: Internal Medicine

## 2020-04-19 ENCOUNTER — Encounter: Payer: Self-pay | Admitting: Internal Medicine

## 2020-04-19 ENCOUNTER — Other Ambulatory Visit: Payer: Self-pay

## 2020-04-19 VITALS — BP 125/85 | HR 74 | Temp 97.3°F | Resp 17 | Ht 66.0 in | Wt 191.0 lb

## 2020-04-19 DIAGNOSIS — Z124 Encounter for screening for malignant neoplasm of cervix: Secondary | ICD-10-CM | POA: Insufficient documentation

## 2020-04-19 DIAGNOSIS — I10 Essential (primary) hypertension: Secondary | ICD-10-CM

## 2020-04-19 DIAGNOSIS — Z1211 Encounter for screening for malignant neoplasm of colon: Secondary | ICD-10-CM | POA: Diagnosis not present

## 2020-04-19 DIAGNOSIS — Z0000 Encounter for general adult medical examination without abnormal findings: Secondary | ICD-10-CM | POA: Diagnosis not present

## 2020-04-19 DIAGNOSIS — F411 Generalized anxiety disorder: Secondary | ICD-10-CM

## 2020-04-19 DIAGNOSIS — Z1151 Encounter for screening for human papillomavirus (HPV): Secondary | ICD-10-CM | POA: Insufficient documentation

## 2020-04-19 DIAGNOSIS — E119 Type 2 diabetes mellitus without complications: Secondary | ICD-10-CM

## 2020-04-19 DIAGNOSIS — Z1231 Encounter for screening mammogram for malignant neoplasm of breast: Secondary | ICD-10-CM

## 2020-04-19 MED ORDER — AMLODIPINE BESYLATE 10 MG PO TABS: 10.0000 mg | ORAL_TABLET | Freq: Every day | ORAL | 0 refills | Status: DC

## 2020-04-19 MED ORDER — SERTRALINE HCL 50 MG PO TABS: 50.0000 mg | ORAL_TABLET | Freq: Every day | ORAL | 3 refills | Status: DC

## 2020-04-19 MED ORDER — LOSARTAN POTASSIUM 100 MG PO TABS: 100.0000 mg | ORAL_TABLET | Freq: Every day | ORAL | 0 refills | Status: DC

## 2020-04-19 NOTE — Progress Notes (Signed)
Subjective:    Jessica Ponce - 54 y.o. female MRN [id number]  Date of birth: Jun 02, 1966  HPI  Jessica Ponce is here for annual exam. Declines STD testing.  She reports since starting the Zoloft her anxiety has improved tremendously. She has noticed a big difference. She is currently taking two 25 mg tablets and would like to know if there is a 50 mg tablet.      Health Maintenance:  Health Maintenance Due  Topic Date Due  . HEMOGLOBIN A1C  Never done  . PNEUMOCOCCAL POLYSACCHARIDE VACCINE AGE 59-64 HIGH RISK  Never done  . FOOT EXAM  Never done  . OPHTHALMOLOGY EXAM  Never done  . COVID-19 Vaccine (1) Never done  . HIV Screening  Never done  . TETANUS/TDAP  Never done  . COLONOSCOPY  Never done  . MAMMOGRAM  06/02/2019  . PAP SMEAR-Modifier  06/01/2020    -  reports that she has never smoked. She has never used smokeless tobacco. - Review of Systems: Per HPI. - Past Medical History: Patient Active Problem List   Diagnosis Date Noted  . Family history of breast cancer 03/11/2020  . Type 2 diabetes mellitus ([place]) 03/11/2020  . Hypertension, essential 07/31/2016   - Medications: reviewed and updated   Objective:   Physical Exam BP 125/85   Pulse 74   Temp (!) 97.3 F (36.3 C) (Temporal)   Resp 17   Ht 5\' 6"  (1.676 m)   Wt 191 lb (86.6 kg)   LMP 12/10/2015 Comment: IUD  SpO2 98%   BMI 30.83 kg/m  Physical Exam  Constitutional: She is oriented to person, place, and time and well-developed, well-nourished, and in no distress.  HENT:  Head: Normocephalic and atraumatic.  Mouth/Throat: Oropharynx is clear and moist.  TMs normal bilaterally   Eyes: Pupils are equal, round, and reactive to light. Conjunctivae and EOM are normal.  Neck: No thyromegaly present.  Cardiovascular: Normal rate, regular rhythm, normal heart sounds and intact distal pulses.  No murmur heard. Pulmonary/Chest: Effort normal and breath sounds normal. No respiratory distress. She has no wheezes.   Breast exam: Breasts symmetric. No erythema/rashes/skin color changes/skin texture changes/nipple discharge. No discrete palpable masses appreciated in axilla or 4 quadrants of the breast bilaterally. Mole present on the left lower breast.   Abdominal: Soft. Bowel sounds are normal. She exhibits no distension. There is no abdominal tenderness. There is no rebound and no guarding.  Genitourinary:    Genitourinary Comments: GU/GYN: Exam performed in the presence of a chaperone. External genitalia within normal limits.  Vaginal mucosa pink, moist, normal rugae.  Nonfriable cervix without lesions, no discharge or bleeding noted on speculum exam.  Bimanual exam revealed normal, nongravid uterus.  No cervical motion tenderness. No adnexal masses bilaterally.     Musculoskeletal:        General: No deformity or edema. Normal range of motion.     Cervical back: Normal range of motion and neck supple.  Lymphadenopathy:    She has no cervical adenopathy.  Neurological: She is alert and oriented to person, place, and time. Gait normal.  Skin: Skin is warm and dry. No rash noted. She is not diaphoretic.  Psychiatric: Mood, affect and judgment normal.           Assessment & Plan:  1. Annual physical exam Counseled on 150 minutes of exercise per week, healthy eating (including decreased daily intake of saturated fats, cholesterol, added sugars, sodium), STI prevention, routine healthcare maintenance. -  CBC with Differential - Comprehensive metabolic panel - Lipid Panel  2. Pap smear for cervical cancer screening - Cytology - PAP([place])  3. Breast cancer screening by mammogram - MM Digital Screening; Future  4. Colon cancer screening - Ambulatory referral to Gastroenterology  5. Type 2 diabetes mellitus without complication, without long-term current use of insulin ([place]) Patient reports fasting CBGs in 200s. She would like diet trial of 3 months prior to starting medications if A1c  elevated, which suspect it will be. She has been on Metformin in the past and not tolerated due to GI side effects. Was on insulin during pregnancies.  - Hemoglobin A1c - Microalbumin/Creatinine Ratio, Urine - Ambulatory referral to Ophthalmology  6. Anxiety state Improved. Continue Zoloft 50 mg. Will discuss weaning after 6-9 months of therapy.  - sertraline (ZOLOFT) 50 MG tablet; Take 1 tablet (50 mg total) by mouth daily.  Dispense: 30 tablet; Refill: 3  7. Essential hypertension BP at goal today. Continue current medication regimen.  - amLODipine (NORVASC) 10 MG tablet; Take 1 tablet (10 mg total) by mouth daily.  Dispense: 90 tablet; Refill: 0 - losartan (COZAAR) 100 MG tablet; Take 1 tablet (100 mg total) by mouth daily.  Dispense: 90 tablet; Refill: 0   Phill Myron, D.O. 04/19/2020, 10:12 AM Primary Care at [facility]

## 2020-04-20 LAB — LIPID PANEL
Chol/HDL Ratio: 6.7 ratio — ABNORMAL HIGH (ref 0.0–4.4)
Cholesterol, Total: 313 mg/dL — ABNORMAL HIGH (ref 100–199)
HDL: 47 mg/dL (ref >39)
LDL Chol Calc (NIH): 225 mg/dL — ABNORMAL HIGH (ref 0–99)
Triglycerides: 207 mg/dL — ABNORMAL HIGH (ref 0–149)
VLDL Cholesterol Cal: 41 mg/dL — ABNORMAL HIGH (ref 5–40)

## 2020-04-20 LAB — CBC WITH DIFFERENTIAL/PLATELET
Basophils Absolute: 0 10*3/uL (ref 0.0–0.2)
Basos: 0 %
EOS (ABSOLUTE): 0.3 10*3/uL (ref 0.0–0.4)
Eos: 4 %
Hematocrit: 43.5 % (ref 34.0–46.6)
Hemoglobin: 14.9 g/dL (ref 11.1–15.9)
Immature Grans (Abs): 0 10*3/uL (ref 0.0–0.1)
Immature Granulocytes: 0 %
Lymphocytes Absolute: 2.9 10*3/uL (ref 0.7–3.1)
Lymphs: 38 %
MCH: 29.6 pg (ref 26.6–33.0)
MCHC: 34.3 g/dL (ref 31.5–35.7)
MCV: 87 fL (ref 79–97)
Monocytes Absolute: 0.3 10*3/uL (ref 0.1–0.9)
Monocytes: 4 %
Neutrophils Absolute: 4.2 10*3/uL (ref 1.4–7.0)
Neutrophils: 54 %
Platelets: 309 10*3/uL (ref 150–450)
RBC: 5.03 x10E6/uL (ref 3.77–5.28)
RDW: 12.8 % (ref 11.7–15.4)
WBC: 7.7 10*3/uL (ref 3.4–10.8)

## 2020-04-20 LAB — COMPREHENSIVE METABOLIC PANEL
ALT: 14 IU/L (ref 0–32)
AST: 11 IU/L (ref 0–40)
Albumin/Globulin Ratio: 1.8 (ref 1.2–2.2)
Albumin: 4.8 g/dL (ref 3.8–4.9)
Alkaline Phosphatase: 84 IU/L (ref 48–121)
BUN/Creatinine Ratio: 20 (ref 9–23)
BUN: 16 mg/dL (ref 6–24)
Bilirubin Total: 0.4 mg/dL (ref 0.0–1.2)
CO2: 22 mmol/L (ref 20–29)
Calcium: 9.5 mg/dL (ref 8.7–10.2)
Chloride: 100 mmol/L (ref 96–106)
Creatinine, Ser: 0.8 mg/dL (ref 0.57–1.00)
GFR calc Af Amer: 97 mL/min/{1.73_m2} (ref >59)
GFR calc non Af Amer: 84 mL/min/{1.73_m2} (ref >59)
Globulin, Total: 2.6 g/dL (ref 1.5–4.5)
Glucose: 242 mg/dL — ABNORMAL HIGH (ref 65–99)
Potassium: 4.5 mmol/L (ref 3.5–5.2)
Sodium: 139 mmol/L (ref 134–144)
Total Protein: 7.4 g/dL (ref 6.0–8.5)

## 2020-04-20 LAB — HEMOGLOBIN A1C
Est. average glucose Bld gHb Est-mCnc: 266 mg/dL
Hgb A1c MFr Bld: 10.9 % — ABNORMAL HIGH (ref 4.8–5.6)

## 2020-04-21 LAB — MICROALBUMIN / CREATININE URINE RATIO
Creatinine, Urine: 116.1 mg/dL
Microalb/Creat Ratio: 8 mg/g creat (ref 0–29)
Microalbumin, Urine: 9.8 ug/mL

## 2020-04-22 LAB — CYTOLOGY - PAP
Comment: NEGATIVE
Diagnosis: NEGATIVE
High risk HPV: NEGATIVE

## 2020-04-23 ENCOUNTER — Other Ambulatory Visit: Payer: Self-pay | Admitting: Internal Medicine

## 2020-04-23 DIAGNOSIS — E782 Mixed hyperlipidemia: Secondary | ICD-10-CM | POA: Insufficient documentation

## 2020-04-23 MED ORDER — ROSUVASTATIN CALCIUM 10 MG PO TABS: 10.0000 mg | ORAL_TABLET | Freq: Every day | ORAL | 3 refills | Status: DC

## 2020-04-23 NOTE — Progress Notes (Signed)
Patient notified of results & recommendations. Expressed understanding.

## 2020-04-30 ENCOUNTER — Encounter: Payer: Self-pay | Admitting: Gastroenterology

## 2020-05-24 ENCOUNTER — Ambulatory Visit
Admission: RE | Admit: 2020-05-24 | Discharge: 2020-05-24 | Disposition: A | Discharge status: Home / Self Care | Payer: 59 | Source: Ambulatory Visit | Attending: Internal Medicine | Admitting: Internal Medicine

## 2020-05-24 ENCOUNTER — Other Ambulatory Visit: Payer: Self-pay

## 2020-05-24 DIAGNOSIS — Z1231 Encounter for screening mammogram for malignant neoplasm of breast: Secondary | ICD-10-CM

## 2020-06-04 ENCOUNTER — Encounter: Payer: 59 | Admitting: Gastroenterology

## 2020-06-04 ENCOUNTER — Ambulatory Visit (AMBULATORY_SURGERY_CENTER): Payer: Self-pay | Admitting: *Deleted

## 2020-06-04 ENCOUNTER — Other Ambulatory Visit: Payer: Self-pay

## 2020-06-04 VITALS — Ht 63.5 in | Wt 192.0 lb

## 2020-06-04 DIAGNOSIS — Z01818 Encounter for other preprocedural examination: Secondary | ICD-10-CM

## 2020-06-04 DIAGNOSIS — Z1211 Encounter for screening for malignant neoplasm of colon: Secondary | ICD-10-CM

## 2020-06-04 MED ORDER — SUPREP BOWEL PREP KIT 17.5-3.13-1.6 GM/177ML PO SOLN: 1.0000 | Freq: Once | ORAL | 0 refills | Status: AC

## 2020-06-04 NOTE — Progress Notes (Signed)
Patient denies any allergies to egg or soy products. Patient denies complications with anesthesia/sedation.  Patient denies oxygen use at home and denies diet medications. Emmi instructions for colonoscopy explained and given to patient.  Covid test scheduled on 06/13/20 at 0810.

## 2020-06-13 ENCOUNTER — Ambulatory Visit (INDEPENDENT_AMBULATORY_CARE_PROVIDER_SITE_OTHER): Payer: Self-pay

## 2020-06-13 ENCOUNTER — Other Ambulatory Visit: Payer: Self-pay | Admitting: Gastroenterology

## 2020-06-13 DIAGNOSIS — Z1159 Encounter for screening for other viral diseases: Secondary | ICD-10-CM

## 2020-06-14 LAB — SARS CORONAVIRUS 2 (TAT 6-24 HRS): SARS Coronavirus 2: NEGATIVE

## 2020-06-18 ENCOUNTER — Encounter: Payer: Self-pay | Admitting: Gastroenterology

## 2020-06-18 ENCOUNTER — Other Ambulatory Visit: Payer: Self-pay

## 2020-06-18 ENCOUNTER — Ambulatory Visit (AMBULATORY_SURGERY_CENTER): Payer: 59 | Admitting: Gastroenterology

## 2020-06-18 VITALS — BP 127/82 | HR 64 | Temp 97.3°F | Resp 11 | Ht 63.5 in | Wt 192.0 lb

## 2020-06-18 DIAGNOSIS — D123 Benign neoplasm of transverse colon: Secondary | ICD-10-CM

## 2020-06-18 DIAGNOSIS — Z1211 Encounter for screening for malignant neoplasm of colon: Secondary | ICD-10-CM

## 2020-06-18 MED ORDER — SODIUM CHLORIDE 0.9 % IV SOLN
500.0000 mL | Freq: Once | INTRAVENOUS | Status: DC
Start: 2020-06-18 — End: 2020-06-18

## 2020-06-18 NOTE — Patient Instructions (Signed)
Handout provided on polyps.   YOU HAD AN ENDOSCOPIC PROCEDURE TODAY AT THE [facility]:   Refer to the procedure report that was given to you for any specific questions about what was found during the examination.  If the procedure report does not answer your questions, please call your gastroenterologist to clarify.  If you requested that your care partner not be given the details of your procedure findings, then the procedure report has been included in a sealed envelope for you to review at your convenience later.  YOU SHOULD EXPECT: Some feelings of bloating in the abdomen. Passage of more gas than usual.  Walking can help get rid of the air that was put into your GI tract during the procedure and reduce the bloating. If you had a lower endoscopy (such as a colonoscopy or flexible sigmoidoscopy) you may notice spotting of blood in your stool or on the toilet paper. If you underwent a bowel prep for your procedure, you may not have a normal bowel movement for a few days.  Please Note:  You might notice some irritation and congestion in your nose or some drainage.  This is from the oxygen used during your procedure.  There is no need for concern and it should clear up in a day or so.  SYMPTOMS TO REPORT IMMEDIATELY:  Following lower endoscopy (colonoscopy or flexible sigmoidoscopy):  Excessive amounts of blood in the stool  Significant tenderness or worsening of abdominal pains  Swelling of the abdomen that is new, acute  Fever of 100F or higher  For urgent or emergent issues, a gastroenterologist can be reached at any hour by calling [phone]. Do not use MyChart messaging for urgent concerns.    DIET:  We do recommend a small meal at first, but then you may proceed to your regular diet.  Drink plenty of fluids but you should avoid alcoholic beverages for 24 hours.  ACTIVITY:  You should plan to take it easy for the rest of today and you should NOT DRIVE or use heavy  machinery until tomorrow (because of the sedation medicines used during the test).    FOLLOW UP: Our staff will call the number listed on your records 48-72 hours following your procedure to check on you and address any questions or concerns that you may have regarding the information given to you following your procedure. If we do not reach you, we will leave a message.  We will attempt to reach you two times.  During this call, we will ask if you have developed any symptoms of COVID 19. If you develop any symptoms (ie: fever, flu-like symptoms, shortness of breath, cough etc.) before then, please call [phone].  If you test positive for Covid 19 in the 2 weeks post procedure, please call and report this information to us.    If any biopsies were taken you will be contacted by phone or by letter within the next 1-3 weeks.  Please call us at [phone] if you have not heard about the biopsies in 3 weeks.    SIGNATURES/CONFIDENTIALITY: You and/or your care partner have signed paperwork which will be entered into your electronic medical record.  These signatures attest to the fact that that the information above on your After Visit Summary has been reviewed and is understood.  Full responsibility of the confidentiality of this discharge information lies with you and/or your care-partner.  

## 2020-06-18 NOTE — Op Note (Signed)
[facility] Patient Name: Jessica Ponce Procedure Date: 06/18/2020 10:45 AM MRN: [id number] Endoscopist: Mallie Mussel L. Loletha Carrow , MD Age: 54 Referring MD:  Date of Birth: Jul 07, 1966 Gender: Female Account #: [id number] Procedure:                Colonoscopy Indications:              Screening for colorectal malignant neoplasm, This                            is the patient's first colonoscopy Medicines:                Monitored Anesthesia Care Procedure:                Pre-Anesthesia Assessment:                           - Prior to the procedure, a History and Physical                            was performed, and patient medications and                            allergies were reviewed. The patient's tolerance of                            previous anesthesia was also reviewed. The risks                            and benefits of the procedure and the sedation                            options and risks were discussed with the patient.                            All questions were answered, and informed consent                            was obtained. Prior Anticoagulants: The patient has                            taken no previous anticoagulant or antiplatelet                            agents. ASA Grade Assessment: II - A patient with                            mild systemic disease. After reviewing the risks                            and benefits, the patient was deemed in                            satisfactory condition to undergo the procedure.  After obtaining informed consent, the colonoscope                            was passed under direct vision. Throughout the                            procedure, the patient's blood pressure, pulse, and                            oxygen saturations were monitored continuously. The                            Colonoscope was introduced through the anus and                            advanced to the the cecum,  identified by                            appendiceal orifice and ileocecal valve. The                            colonoscopy was performed without difficulty. The                            patient tolerated the procedure well. The quality                            of the bowel preparation was good after lavage. The                            ileocecal valve, appendiceal orifice, and rectum                            were photographed. The bowel preparation used was                            SUPREP. Scope In: 11:12:08 AM Scope Out: 11:29:10 AM Scope Withdrawal Time: 0 hours 13 minutes 38 seconds  Total Procedure Duration: 0 hours 17 minutes 2 seconds  Findings:                 The perianal and digital rectal examinations were                            normal.                           A diminutive polyp was found in the transverse                            colon. The polyp was sessile. The polyp was removed                            with a cold snare. Resection and retrieval were  complete.                           A diminutive polyp was found in the transverse                            colon. The polyp was flat. The polyp was removed                            with a cold biopsy forceps. Resection and retrieval                            were complete.                           The exam was otherwise without abnormality on                            direct and retroflexion views. Complications:            No immediate complications. Estimated Blood Loss:     Estimated blood loss was minimal. Impression:               - One diminutive polyp in the transverse colon,                            removed with a cold snare. Resected and retrieved.                           - One diminutive polyp in the transverse colon,                            removed with a cold biopsy forceps. Resected and                            retrieved.                           -  The examination was otherwise normal on direct                            and retroflexion views. Recommendation:           - Patient has a contact number available for                            emergencies. The signs and symptoms of potential                            delayed complications were discussed with the                            patient. Return to normal activities tomorrow.                            Written discharge instructions were provided to the  patient.                           - Resume previous diet.                           - Continue present medications.                           - Await pathology results.                           - Repeat colonoscopy is recommended for                            surveillance. The colonoscopy date will be                            determined after pathology results from today's                            exam become available for review. Carley Glendenning L. Loletha Carrow, MD 06/18/2020 11:35:53 AM This report has been signed electronically.

## 2020-06-18 NOTE — Progress Notes (Signed)
Pt's states no medical or surgical changes since previsit or office visit. 

## 2020-06-18 NOTE — Progress Notes (Signed)
PT taken to PACU. Monitors in place. VSS. Report given to RN. 

## 2020-06-18 NOTE — Progress Notes (Signed)
Called to room to assist during endoscopic procedure.  Patient ID and intended procedure confirmed with present staff. Received instructions for my participation in the procedure from the performing physician.  

## 2020-06-18 NOTE — Progress Notes (Signed)
VS taken by C.W. 

## 2020-06-20 ENCOUNTER — Telehealth: Payer: Self-pay

## 2020-06-20 NOTE — Telephone Encounter (Signed)
  Follow up Call-  Call back number 06/18/2020  Post procedure Call Back phone  # [phone]  Permission to leave phone message Yes  Some recent data might be hidden     Patient questions:  Do you have a fever, pain , or abdominal swelling? No. Pain Score  0 *  Have you tolerated food without any problems? Yes.    Have you been able to return to your normal activities? Yes.    Do you have any questions about your discharge instructions: Diet   No. Medications  No. Follow up visit  No.  Do you have questions or concerns about your Care? No.  Actions: * If pain score is 4 or above: No action needed, pain <4.  1. Have you developed a fever since your procedure? no  2.   Have you had an respiratory symptoms (SOB or cough) since your procedure? no  3.   Have you tested positive for COVID 19 since your procedure no  4.   Have you had any family members/close contacts diagnosed with the COVID 19 since your procedure?  no   If yes to any of these questions please route to Joylene John, RN and Erenest Rasher, RN

## 2020-06-24 ENCOUNTER — Encounter: Payer: Self-pay | Admitting: Gastroenterology

## 2020-07-15 ENCOUNTER — Other Ambulatory Visit: Payer: Self-pay

## 2020-07-15 ENCOUNTER — Ambulatory Visit (INDEPENDENT_AMBULATORY_CARE_PROVIDER_SITE_OTHER): Payer: 59 | Admitting: Physician Assistant

## 2020-07-15 VITALS — BP 144/88 | HR 65 | Temp 97.5°F | Resp 17 | Wt 184.0 lb

## 2020-07-15 DIAGNOSIS — F411 Generalized anxiety disorder: Secondary | ICD-10-CM

## 2020-07-15 DIAGNOSIS — Z1159 Encounter for screening for other viral diseases: Secondary | ICD-10-CM

## 2020-07-15 DIAGNOSIS — Z8349 Family history of other endocrine, nutritional and metabolic diseases: Secondary | ICD-10-CM | POA: Insufficient documentation

## 2020-07-15 DIAGNOSIS — E782 Mixed hyperlipidemia: Secondary | ICD-10-CM

## 2020-07-15 DIAGNOSIS — I10 Essential (primary) hypertension: Secondary | ICD-10-CM | POA: Diagnosis not present

## 2020-07-15 DIAGNOSIS — G479 Sleep disorder, unspecified: Secondary | ICD-10-CM

## 2020-07-15 DIAGNOSIS — E559 Vitamin D deficiency, unspecified: Secondary | ICD-10-CM

## 2020-07-15 DIAGNOSIS — Z114 Encounter for screening for human immunodeficiency virus [HIV]: Secondary | ICD-10-CM

## 2020-07-15 DIAGNOSIS — E119 Type 2 diabetes mellitus without complications: Secondary | ICD-10-CM

## 2020-07-15 LAB — GLUCOSE, POCT (MANUAL RESULT ENTRY): POC Glucose: 254 mg/dl — AB (ref 70–99)

## 2020-07-15 LAB — POCT GLYCOSYLATED HEMOGLOBIN (HGB A1C): Hemoglobin A1C: 9.5 % — AB (ref 4.0–5.6)

## 2020-07-15 MED ORDER — AMLODIPINE BESYLATE 10 MG PO TABS: 10.0000 mg | ORAL_TABLET | Freq: Every day | ORAL | 0 refills | Status: DC

## 2020-07-15 MED ORDER — ONETOUCH ULTRASOFT LANCETS MISC: 12 refills | Status: DC

## 2020-07-15 MED ORDER — LOSARTAN POTASSIUM 100 MG PO TABS: 100.0000 mg | ORAL_TABLET | Freq: Every day | ORAL | 0 refills | Status: DC

## 2020-07-15 MED ORDER — ONETOUCH ULTRA VI STRP: ORAL_STRIP | 0 refills | Status: DC

## 2020-07-15 MED ORDER — SERTRALINE HCL 50 MG PO TABS: 50.0000 mg | ORAL_TABLET | Freq: Every day | ORAL | 3 refills | Status: DC

## 2020-07-15 MED ORDER — ONETOUCH ULTRA MINI W/DEVICE KIT: PACK | 0 refills | Status: DC

## 2020-07-15 NOTE — Patient Instructions (Addendum)
The podcast I spoke of is called Losing 100 pounds with Corinne. Increase your water to at least 64- 80 ounces a day.  Practice mindful eating (stop eating if you are not hungry and when you have had enough to satisfy physical hunger)  I started a referral for a sleep study.  I encourage you to get vaccinated , please feel free to come to our mobile unit for vaccination (we have [place])  We will call you with your lab results.  Kennieth Rad, PA-C Physician Assistant [place] [email or web address]    Blood Pressure Record Sheet To take your blood pressure, you will need a blood pressure machine. You can buy a blood pressure machine (blood pressure monitor) at your clinic, drug store, or online. When choosing one, consider:  An automatic monitor that has an arm cuff.  A cuff that wraps snugly around your upper arm. You should be able to fit only one finger between your arm and the cuff.  A device that stores blood pressure reading results.  Do not choose a monitor that measures your blood pressure from your wrist or finger. Follow your health care provider's instructions for how to take your blood pressure. To use this form:  Get one reading in the morning (a.m.) before you take any medicines.  Get one reading in the evening (p.m.) before supper.  Take at least 2 readings with each blood pressure check. This makes sure the results are correct. Wait 1-2 minutes between measurements.  Write down the results in the spaces on this form.  Repeat this once a week, or as told by your health care provider.  Make a follow-up appointment with your health care provider to discuss the results. Blood pressure log Date: _______________________  a.m. _____________________(1st reading) _____________________(2nd reading)  p.m. _____________________(1st reading) _____________________(2nd reading) Date: _______________________  a.m.  _____________________(1st reading) _____________________(2nd reading)  p.m. _____________________(1st reading) _____________________(2nd reading) Date: _______________________  a.m. _____________________(1st reading) _____________________(2nd reading)  p.m. _____________________(1st reading) _____________________(2nd reading) Date: _______________________  a.m. _____________________(1st reading) _____________________(2nd reading)  p.m. _____________________(1st reading) _____________________(2nd reading) Date: _______________________  a.m. _____________________(1st reading) _____________________(2nd reading)  p.m. _____________________(1st reading) _____________________(2nd reading) This information is not intended to replace advice given to you by your health care provider. Make sure you discuss any questions you have with your health care provider. Document Revised: 01/14/2018 Document Reviewed: 11/16/2017 Elsevier Patient Education  [place].

## 2020-07-15 NOTE — Progress Notes (Signed)
Established Patient Office Visit  Subjective:  Patient ID: Jessica Ponce, female    DOB: November 29, 1966  Age: 54 y.o. MRN: [id number]  CC:  Chief Complaint  Patient presents with  . Diabetes  . Hypertension  . Hyperlipidemia    HPI Ronit Cranfield reports that she continues to work on reducing her daily blood glucose levels through diet. Reports that she has been following a keto diet, has lost 6 pounds in the last 3 months. Reports blood glucose levels at home approximately 240 fasting. Reports that she previously was able to reverse the need for medication with diabetes 3 years ago.   Reports that she has been feeling "tremors" inside, states she notices them most when she wakes in the middle the night to use the bathroom, however last night she was able to feel them while sitting in her bed. Reports these were present prior to starting Zoloft. Questions whether this is anxiety.  Reports she feels her anxiety is greatly improved since starting the Zoloft, sleep is improved as well. States she still has some fatigue during the day, does endorse snoring. No previous sleep study.  No daily vitamin, does endorse daily green leafy vegetables, drinks approximately 40 ounces of water.    Past Medical History:  Diagnosis Date  . Allergy   . Asthma    no current problems, does not use inhaler  . Diabetes mellitus without complication ([place])    type 2  . Essential hypertension   . Hyperlipidemia     Past Surgical History:  Procedure Laterality Date  . APPENDECTOMY  1985  . exploratory laparoscopy    . WISDOM TOOTH EXTRACTION      Family History  Problem Relation Age of Onset  . Memory loss Mother   . Breast cancer Mother 6  . Breast cancer Maternal Aunt 74  . Colon cancer Neg Hx   . Rectal cancer Neg Hx   . Stomach cancer Neg Hx   . Esophageal cancer Neg Hx     Social History   Socioeconomic History  . Marital status: Married    Spouse name: Legrand Como  . Number of children: 2   . Years of education: 35  . Highest education level: Not on file  Occupational History  . Not on file  Tobacco Use  . Smoking status: Never Smoker  . Smokeless tobacco: Never Used  Vaping Use  . Vaping Use: Never used  Substance and Sexual Activity  . Alcohol use: Yes    Alcohol/week: 0.0 standard drinks    Comment: occasional wine  . Drug use: No  . Sexual activity: Yes    Birth control/protection: Post-menopausal  Other Topics Concern  . Not on file  Social History Narrative   Lives with husband and 2 kids   Caffeine use: Drinks 2-3 cups coffee per day   Social Determinants of Health   Financial Resource Strain:   . Difficulty of Paying Living Expenses:   Food Insecurity:   . Worried About Charity fundraiser in the Last Year:   . Arboriculturist in the Last Year:   Transportation Needs:   . Film/video editor (Medical):   Marland Kitchen Lack of Transportation (Non-Medical):   Physical Activity:   . Days of Exercise per Week:   . Minutes of Exercise per Session:   Stress:   . Feeling of Stress :   Social Connections:   . Frequency of Communication with Friends and Family:   . Frequency  of Social Gatherings with Friends and Family:   . Attends Religious Services:   . Active Member of Clubs or Organizations:   . Attends Archivist Meetings:   Marland Kitchen Marital Status:   Intimate Partner Violence:   . Fear of Current or Ex-Partner:   . Emotionally Abused:   Marland Kitchen Physically Abused:   . Sexually Abused:     Outpatient Medications Prior to Visit  Medication Sig Dispense Refill  . amLODipine (NORVASC) 10 MG tablet Take 1 tablet (10 mg total) by mouth daily. 90 tablet 0  . losartan (COZAAR) 100 MG tablet Take 1 tablet (100 mg total) by mouth daily. 90 tablet 0  . sertraline (ZOLOFT) 50 MG tablet Take 1 tablet (50 mg total) by mouth daily. 30 tablet 3  . albuterol (VENTOLIN HFA) 108 (90 Base) MCG/ACT inhaler Inhale into the lungs every 6 (six) hours as needed for wheezing or  shortness of breath.     . Blood Glucose Monitoring Suppl (BAYER CONTOUR LINK 2.4) w/Device KIT 1 Device by Does not apply route daily as needed. 1 kit 0  . glucose blood test strip Use as instructed 100 each 12  . Lancet Devices (MICROLET NEXT LANCING DEVICE) MISC 1 Device by Does not apply route daily as needed. 1 each 0  . Microlet Lancets MISC 1 application by Does not apply route daily as needed. 90 each 0   No facility-administered medications prior to visit.    Allergies  Allergen Reactions  . Valsartan Diarrhea  . Atorvastatin Other (See Comments)    Elevated sugars  . Codeine Nausea Only    vomiting vomitting    ROS Review of Systems  Constitutional: Positive for fatigue.  HENT: Negative.   Eyes: Negative.   Respiratory: Negative.   Cardiovascular: Negative.   Gastrointestinal: Negative.   Endocrine: Negative.   Genitourinary: Negative.   Musculoskeletal: Negative.   Skin: Negative.   Allergic/Immunologic: Negative.   Neurological: Negative.   Hematological: Negative.   Psychiatric/Behavioral: Negative.       Objective:    Physical Exam Vitals and nursing note reviewed.  Constitutional:      General: She is not in acute distress.    Appearance: Normal appearance. She is not ill-appearing.  HENT:     Head: Normocephalic and atraumatic.     Right Ear: External ear normal.     Left Ear: External ear normal.     Mouth/Throat:     Mouth: Mucous membranes are moist.     Pharynx: Oropharynx is clear.  Eyes:     Extraocular Movements: Extraocular movements intact.     Conjunctiva/sclera: Conjunctivae normal.     Pupils: Pupils are equal, round, and reactive to light.  Cardiovascular:     Rate and Rhythm: Normal rate and regular rhythm.     Pulses: Normal pulses.     Heart sounds: Normal heart sounds.  Pulmonary:     Effort: Pulmonary effort is normal.     Breath sounds: Normal breath sounds.  Abdominal:     General: Abdomen is flat.  Musculoskeletal:         General: Normal range of motion.     Cervical back: Normal range of motion and neck supple.  Skin:    General: Skin is warm and dry.  Neurological:     General: No focal deficit present.     Mental Status: She is alert and oriented to person, place, and time.  Psychiatric:  Mood and Affect: Mood normal.        Behavior: Behavior normal.        Thought Content: Thought content normal.        Judgment: Judgment normal.     BP (!) 144/88   Pulse 65   Temp (!) 97.5 F (36.4 C) (Temporal)   Resp 17   Wt 184 lb (83.5 kg)   LMP 12/10/2015 (LMP Unknown)   SpO2 98%   BMI 32.08 kg/m  Wt Readings from Last 3 Encounters:  07/15/20 184 lb (83.5 kg)  06/18/20 192 lb (87.1 kg)  06/04/20 192 lb (87.1 kg)     Health Maintenance Due  Topic Date Due  . Hepatitis C Screening  Never done  . FOOT EXAM  Never done  . OPHTHALMOLOGY EXAM  Never done  . COVID-19 Vaccine (1) Never done  . HIV Screening  Never done  . INFLUENZA VACCINE  06/30/2020    There are no preventive care reminders to display for this patient.  No results found for: TSH Lab Results  Component Value Date   WBC 7.7 04/19/2020   HGB 14.9 04/19/2020   HCT 43.5 04/19/2020   MCV 87 04/19/2020   PLT 309 04/19/2020   Lab Results  Component Value Date   NA 139 04/19/2020   K 4.5 04/19/2020   CO2 22 04/19/2020   GLUCOSE 242 (H) 04/19/2020   BUN 16 04/19/2020   CREATININE 0.80 04/19/2020   BILITOT 0.4 04/19/2020   ALKPHOS 84 04/19/2020   AST 11 04/19/2020   ALT 14 04/19/2020   PROT 7.4 04/19/2020   ALBUMIN 4.8 04/19/2020   CALCIUM 9.5 04/19/2020   ANIONGAP 14 12/12/2015   Lab Results  Component Value Date   CHOL 313 (H) 04/19/2020   Lab Results  Component Value Date   HDL 47 04/19/2020   Lab Results  Component Value Date   LDLCALC 225 (H) 04/19/2020   Lab Results  Component Value Date   TRIG 207 (H) 04/19/2020   Lab Results  Component Value Date   CHOLHDL 6.7 (H) 04/19/2020   Lab  Results  Component Value Date   HGBA1C 9.5 (A) 07/15/2020      Assessment & Plan:   Problem List Items Addressed This Visit      Endocrine   Type 2 diabetes mellitus ([place]) - Primary   Relevant Medications   Blood Glucose Monitoring Suppl (ONE TOUCH ULTRA MINI) w/Device KIT   glucose blood (ONETOUCH ULTRA) test strip   Lancets (ONETOUCH ULTRASOFT) lancets   losartan (COZAAR) 100 MG tablet   Other Relevant Orders   Glucose (CBG) (Completed)   HgB A1c (Completed)     Other   Mixed hyperlipidemia   Relevant Medications   amLODipine (NORVASC) 10 MG tablet   losartan (COZAAR) 100 MG tablet    Other Visit Diagnoses    Essential hypertension       Relevant Medications   amLODipine (NORVASC) 10 MG tablet   losartan (COZAAR) 100 MG tablet   Other Relevant Orders   Thyroid Cascade Profile   Need for hepatitis C screening test       Relevant Orders   HCV Ab w/Rflx to Verification   Encounter for screening for HIV       Relevant Orders   HIV antibody (with reflex)   Vitamin D deficiency       Relevant Orders   Vitamin D, 25-hydroxy   Anxiety state  Relevant Medications   sertraline (ZOLOFT) 50 MG tablet   Sleep disorder       Relevant Orders   Ambulatory referral to Sleep Studies      Meds ordered this encounter  Medications  . Blood Glucose Monitoring Suppl (ONE TOUCH ULTRA MINI) w/Device KIT    Sig: Used to check fasting FSBS daily. Dx: E11.9    Dispense:  1 kit    Refill:  0  . glucose blood (ONETOUCH ULTRA) test strip    Sig: Use to check fasting FSBS daily. Dx: E11.9    Dispense:  100 each    Refill:  0  . Lancets (ONETOUCH ULTRASOFT) lancets    Sig: Use to check fasting FSBS daily. Dx: E11.9    Dispense:  100 each    Refill:  12  . amLODipine (NORVASC) 10 MG tablet    Sig: Take 1 tablet (10 mg total) by mouth daily.    Dispense:  90 tablet    Refill:  0    Order Specific Question:   Supervising Provider    Answer:   Joya Gaskins, PATRICK E [[id number]]  .  losartan (COZAAR) 100 MG tablet    Sig: Take 1 tablet (100 mg total) by mouth daily.    Dispense:  90 tablet    Refill:  0    Order Specific Question:   Supervising Provider    Answer:   Joya Gaskins, PATRICK E [[id number]]  . sertraline (ZOLOFT) 50 MG tablet    Sig: Take 1 tablet (50 mg total) by mouth daily.    Dispense:  30 tablet    Refill:  3    Order Specific Question:   Supervising Provider    Answer:   Joya Gaskins, PATRICK E [[id number]]  1. Type 2 diabetes mellitus without complication, without long-term current use of insulin ([place]) Patient continues to want to work on dietary improvement to help with diabetes. Gave patient education on mindful eating regardless of what style of diet she chooses, increase water intake, continue to work on stress reduction. Patient wants to revisit need for medication in 3 months. Patient education given on risk of elevated daily blood glucose levels. - Glucose (CBG) - HgB A1c - Blood Glucose Monitoring Suppl (ONE TOUCH ULTRA MINI) w/Device KIT; Used to check fasting FSBS daily. Dx: E11.9  Dispense: 1 kit; Refill: 0 - glucose blood (ONETOUCH ULTRA) test strip; Use to check fasting FSBS daily. Dx: E11.9  Dispense: 100 each; Refill: 0 - Lancets (ONETOUCH ULTRASOFT) lancets; Use to check fasting FSBS daily. Dx: E11.9  Dispense: 100 each; Refill: 12  2. Essential hypertension Continue current regimen, encourage patient to check blood pressure on a daily basis, keep written log - Thyroid Cascade Profile - amLODipine (NORVASC) 10 MG tablet; Take 1 tablet (10 mg total) by mouth daily.  Dispense: 90 tablet; Refill: 0 - losartan (COZAAR) 100 MG tablet; Take 1 tablet (100 mg total) by mouth daily.  Dispense: 90 tablet; Refill: 0  3. Mixed hyperlipidemia Continue working on lifestyle modifications, patient will consider medication at next office visit if lipid level still elevated  4. Need for hepatitis C screening test  - HCV Ab w/Rflx to Verification  5. Encounter for  screening for HIV  - HIV antibody (with reflex)  6. Vitamin D deficiency Patient reported history - Vitamin D, 25-hydroxy  7. Anxiety state Continue current regimen - sertraline (ZOLOFT) 50 MG tablet; Take 1 tablet (50 mg total) by mouth daily.  Dispense: 30 tablet; Refill: 3  8. Sleep disorder  - Ambulatory referral to Sleep Studies   I have reviewed the patient's medical history (PMH, PSH, Social History, Family History, Medications, and allergies) , and have been updated if relevant. I spent 30 minutes reviewing chart and  face to face time with patient.    Follow-up: Return in about 3 months (around 10/15/2020) for DM/BP/Chol f/up(phone & labs).    Loraine Grip Mayers, PA-C

## 2020-07-16 ENCOUNTER — Encounter: Payer: Self-pay | Admitting: Physician Assistant

## 2020-07-16 LAB — VITAMIN D 25 HYDROXY (VIT D DEFICIENCY, FRACTURES): Vit D, 25-Hydroxy: 18.7 ng/mL — ABNORMAL LOW (ref 30.0–100.0)

## 2020-07-16 LAB — THYROID CASCADE PROFILE: TSH: 3.15 u[IU]/mL (ref 0.450–4.500)

## 2020-07-16 LAB — HCV AB W/RFLX TO VERIFICATION: HCV Ab: 0.1 s/co ratio (ref 0.0–0.9)

## 2020-07-16 LAB — HIV ANTIBODY (ROUTINE TESTING W REFLEX): HIV Screen 4th Generation wRfx: NONREACTIVE

## 2020-07-16 LAB — HCV INTERPRETATION

## 2020-07-22 ENCOUNTER — Ambulatory Visit: Payer: 59 | Admitting: Internal Medicine

## 2020-08-20 ENCOUNTER — Ambulatory Visit (INDEPENDENT_AMBULATORY_CARE_PROVIDER_SITE_OTHER): Payer: 59 | Admitting: Neurology

## 2020-08-20 ENCOUNTER — Encounter: Payer: Self-pay | Admitting: Neurology

## 2020-08-20 VITALS — BP 122/82 | HR 75 | Ht 65.0 in | Wt 187.0 lb

## 2020-08-20 DIAGNOSIS — R519 Headache, unspecified: Secondary | ICD-10-CM

## 2020-08-20 DIAGNOSIS — E669 Obesity, unspecified: Secondary | ICD-10-CM | POA: Diagnosis not present

## 2020-08-20 DIAGNOSIS — G4719 Other hypersomnia: Secondary | ICD-10-CM | POA: Diagnosis not present

## 2020-08-20 DIAGNOSIS — R351 Nocturia: Secondary | ICD-10-CM

## 2020-08-20 DIAGNOSIS — R0683 Snoring: Secondary | ICD-10-CM | POA: Diagnosis not present

## 2020-08-20 NOTE — Patient Instructions (Signed)

## 2020-08-20 NOTE — Progress Notes (Signed)
Subjective:    Patient ID: Jessica Ponce is a 54 y.o. female.  HPI     Star Age, MD, PhD [facility] [address], [address]O. [place], [place] [address]  Dear [name],  I saw your patient, Jessica Ponce, upon your kind request, in my Sleep clinic today for initial consultation of her sleep disorder, in particular, concern for underlying obstructive sleep apnea.  The patient is unaccompanied today.  As you know, Jessica Ponce is a 54 year old right-handed woman with an underlying medical history of diabetes, hypertension, hyperlipidemia, allergies, asthma, and obesity, who reports snoring and excessive daytime somnolence.  I reviewed your office note from 07/15/2020.  Her Epworth sleepiness score is 8 out of 24, fatigue severity score is 37 out of 63.  She has some trouble going to sleep and staying asleep.  Since she started Zoloft for anxiety, her sleep difficulties have improved a little bit.  She has no family history of sleep apnea.  She has had occasional morning headaches and has nocturia about once or twice per average night.  She lives with her family which includes her husband and younger daughter, aged 55, she also has a grandson, age 73 who lives in the [place] area.  She goes to bed between 11 and midnight and rise time is around 7 but she is often up by 6.  They have 2 cats in the household which do not sleep in their bedroom typically.  She has a TV in the bedroom but does not watch it at night.  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  She works from 12:30 PM to 9 PM.  In the past, when she had difficulty going to sleep or staying asleep she tried Unisom and also melatonin, melatonin did not help very much.  She does not currently take any over-the-counter sleep aid or prescription sleep aid.  She is a non-smoker, drinks alcohol occasionally in the form of wine and drinks caffeine in the form of coffee, 2 cups in the mornings and  sometimes a 3rd cup in the early afternoon.  She has recently noticed trembling that affects her when she is still and mostly feels it inside, her husband has never seen it, she does not have any hand tremor or head tremor or leg tremor, no family history of tremors but paternal grandfather had Parkinson's disease.    Her Past Medical History Is Significant For: Past Medical History:  Diagnosis Date  . Allergy   . Asthma    no current problems, does not use inhaler  . Diabetes mellitus without complication ([place])    type 2  . Essential hypertension   . Hyperlipidemia     Her Past Surgical History Is Significant For: Past Surgical History:  Procedure Laterality Date  . APPENDECTOMY  1985  . exploratory laparoscopy    . WISDOM TOOTH EXTRACTION      Her Family History Is Significant For: Family History  Problem Relation Age of Onset  . Memory loss Mother   . Breast cancer Mother 51  . Breast cancer Maternal Aunt 74  . Colon cancer Neg Hx   . Rectal cancer Neg Hx   . Stomach cancer Neg Hx   . Esophageal cancer Neg Hx     Her Social History Is Significant For: Social History   Socioeconomic History  . Marital status: Married    Spouse name: Legrand Como  . Number of children: 2  .  Years of education: 29  . Highest education level: Not on file  Occupational History  . Not on file  Tobacco Use  . Smoking status: Never Smoker  . Smokeless tobacco: Never Used  Vaping Use  . Vaping Use: Never used  Substance and Sexual Activity  . Alcohol use: Yes    Alcohol/week: 0.0 standard drinks    Comment: occasional wine  . Drug use: No  . Sexual activity: Yes    Birth control/protection: Post-menopausal  Other Topics Concern  . Not on file  Social History Narrative   Lives with husband and 2 kids   Caffeine use: Drinks 2-3 cups coffee per day   Social Determinants of Health   Financial Resource Strain:   . Difficulty of Paying Living Expenses: Not on file  Food Insecurity:    . Worried About Charity fundraiser in the Last Year: Not on file  . Ran Out of Food in the Last Year: Not on file  Transportation Needs:   . Lack of Transportation (Medical): Not on file  . Lack of Transportation (Non-Medical): Not on file  Physical Activity:   . Days of Exercise per Week: Not on file  . Minutes of Exercise per Session: Not on file  Stress:   . Feeling of Stress : Not on file  Social Connections:   . Frequency of Communication with Friends and Family: Not on file  . Frequency of Social Gatherings with Friends and Family: Not on file  . Attends Religious Services: Not on file  . Active Member of Clubs or Organizations: Not on file  . Attends Archivist Meetings: Not on file  . Marital Status: Not on file    Her Allergies Are:  Allergies  Allergen Reactions  . Valsartan Diarrhea  . Atorvastatin Other (See Comments)    Elevated sugars  . Codeine Nausea Only    vomiting vomitting  :   Her Current Medications Are:  Outpatient Encounter Medications as of 08/20/2020  Medication Sig  . amLODipine (NORVASC) 10 MG tablet Take 1 tablet (10 mg total) by mouth daily.  . Blood Glucose Monitoring Suppl (ONE TOUCH ULTRA MINI) w/Device KIT Used to check fasting FSBS daily. Dx: E11.9  . glucose blood (ONETOUCH ULTRA) test strip Use to check fasting FSBS daily. Dx: E11.9  . Lancets (ONETOUCH ULTRASOFT) lancets Use to check fasting FSBS daily. Dx: E11.9  . losartan (COZAAR) 100 MG tablet Take 1 tablet (100 mg total) by mouth daily.  . sertraline (ZOLOFT) 50 MG tablet Take 1 tablet (50 mg total) by mouth daily.   No facility-administered encounter medications on file as of 08/20/2020.  :  Review of Systems:  Out of a complete 14 point review of systems, all are reviewed and negative with the exception of these symptoms as listed below: Review of Systems  Neurological:       Here for sleep consult. No prior study, snoring is present.  Epworth Sleepiness Scale 0=  would never doze 1= slight chance of dozing 2= moderate chance of dozing 3= high chance of dozing  Sitting and reading:2 Watching TV:2 Sitting inactive in a public place (ex. Theater or meeting):0 As a passenger in a car for an hour without a break:1 Lying down to rest in the afternoon:3 Sitting and talking to someone:0 Sitting quietly after lunch (no alcohol):0 In a car, while stopped in traffic:0 Total:8     Objective:  Neurological Exam  Physical Exam Physical Examination:   Vitals:  08/20/20 0805  BP: 122/82  Pulse: 75  SpO2: 96%    General Examination: The patient is a very pleasant 54 y.o. female in no acute distress. She appears well-developed and well-nourished and well groomed.   HEENT: Normocephalic, atraumatic, pupils are equal, round and reactive to light, extraocular tracking is good without limitation to gaze excursion or nystagmus noted. Hearing is grossly intact. Face is symmetric with normal facial animation. Speech is clear with no dysarthria noted. There is no hypophonia. There is no lip, neck/head, jaw or voice tremor. Neck is supple with full range of passive and active motion. There are no carotid bruits on auscultation. Oropharynx exam reveals: mild mouth dryness, adequate dental hygiene and mild airway crowding, due to small airway entry, slightly longer uvula, tonsillar size of about 1+.  Mallampati class II, tongue protrudes centrally in palate elevates symmetrically.  Neck circumference of 16-1/4 inches.  She has a minimal overbite.  Chest: Clear to auscultation without wheezing, rhonchi or crackles noted.  Heart: S1+S2+0, regular and normal without murmurs, rubs or gallops noted.   Abdomen: Soft, non-tender and non-distended with normal bowel sounds appreciated on auscultation.  Extremities: There is no pitting edema in the distal lower extremities bilaterally.   Skin: Warm and dry without trophic changes noted.   Musculoskeletal: exam reveals  no obvious joint deformities, tenderness or joint swelling or erythema.   Neurologically:  Mental status: The patient is awake, alert and oriented in all 4 spheres. Her immediate and remote memory, attention, language skills and fund of knowledge are appropriate. There is no evidence of aphasia, agnosia, apraxia or anomia. Speech is clear with normal prosody and enunciation. Thought process is linear. Mood is normal and affect is normal.  Cranial nerves II - XII are as described above under HEENT exam.  Motor exam: Normal bulk, strength and tone is noted. There is no resting or action tremor, Romberg is negative. Fine motor skills and coordination: grossly intact.  Cerebellar testing: No dysmetria or intention tremor. There is no truncal or gait ataxia.  Sensory exam: intact to light touch in the upper and lower extremities.  Gait, station and balance: She stands easily. No veering to one side is noted. No leaning to one side is noted. Posture is age-appropriate and stance is narrow based. Gait shows normal stride length and normal pace. No problems turning are noted. Tandem walk is unremarkable.   No shuffling, preserved arm swing.             Assessment and Plan:  In summary, Vihana Ponce is a very pleasant 54 y.o.-year old female with an underlying medical history of diabetes, hypertension, hyperlipidemia, allergies, asthma, and obesity, whose history and physical exam are concerning for obstructive sleep apnea (OSA). I had a long chat with the patient about my findings and the diagnosis of OSA, its prognosis and treatment options. We talked about medical treatments, surgical interventions and non-pharmacological approaches. I explained in particular the risks and ramifications of untreated moderate to severe OSA, especially with respect to developing cardiovascular disease down the Road, including congestive heart failure, difficult to treat hypertension, cardiac arrhythmias, or stroke. Even type 2  diabetes has, in part, been linked to untreated OSA. Symptoms of untreated OSA include daytime sleepiness, memory problems, mood irritability and mood disorder such as depression and anxiety, lack of energy, as well as recurrent headaches, especially morning headaches. We talked about trying to maintain a healthy lifestyle in general, as well as the importance of weight control.  We also talked about the importance of good sleep hygiene. I recommended the following at this time: sleep study.  I explained the sleep test procedure to the patient and also outlined possible surgical and non-surgical treatment options of OSA, including the use of a custom-made dental device (which would require a referral to a specialist dentist or oral surgeon), upper airway surgical options, such as traditional UPPP or a novel less invasive surgical option in the form of Inspire hypoglossal nerve stimulation (which would involve a referral to an ENT surgeon). I also explained the CPAP treatment option to the patient, who indicated that she would be willing to try CPAP if the need arises. I explained the importance of being compliant with PAP treatment, not only for insurance purposes but primarily to improve Her symptoms, and for the patient's long term health benefit, including to reduce Her cardiovascular risks. I answered all her questions today and the patient was in agreement. I plan to see her back after the sleep study is completed and encouraged her to call with any interim questions, concerns, problems or updates.   Thank you very much for allowing me to participate in the care of this nice patient. If I can be of any further assistance to you please do not hesitate to call me at [phone].  Sincerely,   Star Age, MD, PhD

## 2020-09-16 ENCOUNTER — Ambulatory Visit (INDEPENDENT_AMBULATORY_CARE_PROVIDER_SITE_OTHER): Payer: 59 | Admitting: Neurology

## 2020-09-16 DIAGNOSIS — R0683 Snoring: Secondary | ICD-10-CM

## 2020-09-16 DIAGNOSIS — E669 Obesity, unspecified: Secondary | ICD-10-CM

## 2020-09-16 DIAGNOSIS — G4719 Other hypersomnia: Secondary | ICD-10-CM

## 2020-09-16 DIAGNOSIS — R351 Nocturia: Secondary | ICD-10-CM

## 2020-09-16 DIAGNOSIS — R519 Headache, unspecified: Secondary | ICD-10-CM

## 2020-09-16 DIAGNOSIS — G4733 Obstructive sleep apnea (adult) (pediatric): Secondary | ICD-10-CM | POA: Diagnosis not present

## 2020-09-19 ENCOUNTER — Other Ambulatory Visit: Payer: Self-pay

## 2020-09-19 DIAGNOSIS — I10 Essential (primary) hypertension: Secondary | ICD-10-CM

## 2020-09-19 MED ORDER — LOSARTAN POTASSIUM 100 MG PO TABS: 100.0000 mg | ORAL_TABLET | Freq: Every day | ORAL | 0 refills | Status: DC

## 2020-09-19 MED ORDER — AMLODIPINE BESYLATE 10 MG PO TABS: 10.0000 mg | ORAL_TABLET | Freq: Every day | ORAL | 0 refills | Status: DC

## 2020-09-26 ENCOUNTER — Telehealth: Payer: Self-pay | Admitting: Neurology

## 2020-09-26 NOTE — Telephone Encounter (Signed)
Pt has called Casey,RN back. She is asking if she can be called back by 5:15 since that is when her lunch will be over

## 2020-09-26 NOTE — Telephone Encounter (Signed)
-----   Message from Star Age, MD sent at 09/26/2020  8:04 AM EDT ----- Patient referred by Carrolyn Meiers, PA, seen by me on 08/20/20, HST on 09/16/20.    Please call and notify the patient that the recent home sleep test showed obstructive sleep apnea. OSA is overall mild, but worth treating to see if she feels better after treatment. To that end I recommend treatment for this in the form of autoPAP, which means, that we don't have to bring her in for a sleep study with CPAP, but will let her try an autoPAP machine at home, through a DME company (of her choice, or as per insurance requirement). The DME representative will educate her on how to use the machine, how to put the mask on, etc. I have placed an order in the chart. Please send referral, talk to patient, send report to referring MD. We will need a FU in sleep clinic for 10 weeks post-PAP set up, please arrange that with me or one of our NPs. Thanks,   Star Age, MD, PhD [facility])

## 2020-09-26 NOTE — Progress Notes (Signed)
Patient referred by Carrolyn Meiers, PA, seen by me on 08/20/20, HST on 09/16/20.    Please call and notify the patient that the recent home sleep test showed obstructive sleep apnea. OSA is overall mild, but worth treating to see if she feels better after treatment. To that end I recommend treatment for this in the form of autoPAP, which means, that we don't have to bring her in for a sleep study with CPAP, but will let her try an autoPAP machine at home, through a DME company (of her choice, or as per insurance requirement). The DME representative will educate her on how to use the machine, how to put the mask on, etc. I have placed an order in the chart. Please send referral, talk to patient, send report to referring MD. We will need a FU in sleep clinic for 10 weeks post-PAP set up, please arrange that with me or one of our NPs. Thanks,   Star Age, MD, PhD [facility])

## 2020-09-26 NOTE — Procedures (Signed)
Sleep Study Report   Patient Information     First Name: Jessica Last Name: Ponce ID: [id number]  Birth Date: [birth year] Age: 54 Gender: Female  Referring Provider: Carrolyn Meiers, PA BMI: 31.2 (W=187 lb, H=5' 5'')  Neck Circ.:  16 '' Epworth:  8/24   Sleep Study Information    Study Date: 09/16/20 S/H/A Version: [id number] / 4.2.1023 / 35  History:    54 year old woman with a history of diabetes, hypertension, hyperlipidemia, allergies, asthma, and obesity, who reports snoring and excessive daytime somnolence. Summary & Diagnosis:    OSA Recommendations:     This home sleep test demonstrates overall mild obstructive sleep apnea with a total AHI of 11.7/hour and O2 nadir of 84%. Given the patient's medical history and sleep related complaints, treatment with positive airway pressure is recommended. This can be achieved in the form of autoPAP trial/titration at home. A full night CPAP titration study will help with proper treatment settings and mask fitting, if needed, down the road. Alternative treatments include weight loss along with avoidance of the supine sleep position, or an oral appliance in appropriate candidates.   Please note that untreated obstructive sleep apnea may carry additional perioperative morbidity. Patients with significant obstructive sleep apnea should receive perioperative PAP therapy and the surgeons and particularly the anesthesiologist should be informed of the diagnosis and the severity of the sleep disordered breathing. The patient should be cautioned not to drive, work at heights, or operate dangerous or heavy equipment when tired or sleepy. Review and reiteration of good sleep hygiene measures should be pursued with any patient. Other causes of the patient's symptoms, including circadian rhythm disturbances, an underlying mood disorder, medication effect and/or an underlying medical problem cannot be ruled out based on this test. Clinical correlation is recommended.    The patient and her referring provider will be notified of the test results. The patient will be seen in follow up in sleep clinic at [facility].  I certify that I have reviewed the raw data recording prior to the issuance of this report in accordance with the standards of the American Academy of Sleep Medicine (AASM).  Star Age, MD, PhD [facility]) Diplomat, ABPN (Neurology and Sleep)            Sleep Summary  Oxygen Saturation Statistics   Start Study Time: End Study Time: Total Recording Time:          10:45:39 PM 6:50:05 AM 8 h, 4 min  Total Sleep Time % REM of Sleep Time:  6 h, 59 min  24.9    Mean: 92 Minimum: 84 Maximum: 98  Mean of Desaturations Nadirs (%):   90  Oxygen Desaturation. %: 4-9 10-20 >20 Total  Events Number Total  30 100.0  0 0.0  0 0.0  30 100.0  Oxygen Saturation: <90 <=88 <85 <80 <70  Duration (minutes): Sleep % 3.1 1.1 0.7 0.3 0.0 0.0 0.0 0.0 0.0 0.0     Respiratory Indices      Total Events REM NREM All Night  pRDI: pAHI 3%: ODI 4%: pAHIc 3%: % CSR: pAHI 4%:  93  81  30  0 0.0 32 20.7 20.2 10.4 0.0 11.0 8.9 2.3 0.0 13.5 11.7 4.3 0.0 4.6       Pulse Rate Statistics during Sleep (BPM)      Mean: 80 Minimum: 50 Maximum: 104    Indices are calculated using technically valid sleep time of 6 h, 54 min.  pAHI=11.7                               Mild              Moderate                    Severe                                                 5              15                    30   Body Position Statistics  Position Supine Prone Right Left Non-Supine  Sleep (min) 359.9 0.0 19.5 40.5 60.0  Sleep % 85.7 0.0 4.6 9.6 14.3  pRDI 13.4 N/A 15.4 13.4 14.0  pAHI 3% 11.8 N/A 12.3 10.4 11.0  ODI 4% 4.2 N/A 3.1 6.0 5.0            Left   Right  Supine    Snoring Statistics Snoring Level (dB) >40 >50 >60 >70 >80 >Threshold (45)  Sleep (min) 313.7 14.7 0.7 0.0  0.0 83.6  Sleep % 74.7 3.5 0.2 0.0 0.0 19.9    Mean: 43 dB Sleep Stages Chart                         Wake  Sleep      Wake  13.31  %    Sleep  86.69  %   Total:  100.00  %                                                       REM  Light  Deep      REM  24.88  %    Light  61.07  %    Deep  14.05  %   Total:  100.00  %                                 Sleep/Wake States  Sleep Stages  Sleep Latency (min):  REM Latency (min):  Number of Wakes:   34   66   5

## 2020-09-26 NOTE — Telephone Encounter (Signed)
Called patient to discuss sleep study results. No answer at this time. LVM for the patient to call back.   

## 2020-09-26 NOTE — Addendum Note (Signed)
Addended by: Star Age on: 09/26/2020 08:04 AM   Modules accepted: Orders

## 2020-09-30 ENCOUNTER — Encounter: Payer: Self-pay | Admitting: Neurology

## 2020-09-30 NOTE — Telephone Encounter (Signed)
Pt returned call. Pt states she is at work and will not be able to answer a call back so she will try back tomorrow.

## 2020-09-30 NOTE — Telephone Encounter (Signed)
Called the patient back to discuss the SSR. There was no answer. LVM asking for a call back

## 2020-09-30 NOTE — Telephone Encounter (Signed)
Sent a Information systems manager for not being able to take the call. Advised if she calls back tomorrow, Cyril Mourning will be able to review sleep study results.

## 2020-10-02 NOTE — Telephone Encounter (Signed)
I called pt. I advised pt that Dr. Rexene Alberts reviewed their sleep study results and found that pt has mild osa. Dr. Rexene Alberts recommends that pt start an auto pap at home. I reviewed PAP compliance expectations with the pt. Pt is agreeable to starting an auto-PAP. I advised pt that an order will be sent to a DME, [facility], and [facility] will call the pt within about one week after they file with the pt's insurance. [facility] will show the pt how to use the machine, fit for masks, and troubleshoot the auto-PAP if needed. A follow up appt was made for insurance purposes with Amy, NP on 01/08/2021 at 11:00am. Pt verbalized understanding to arrive 15 minutes early and bring their auto-PAP. A letter with all of this information in it will be mailed to the pt as a reminder. I verified with the pt that the address we have on file is correct. Pt verbalized understanding of results. Pt had no questions at this time but was encouraged to call back if questions arise. I have sent the order to [facility] and have received confirmation that they have received the order.

## 2020-10-03 ENCOUNTER — Other Ambulatory Visit: Payer: Self-pay | Admitting: *Deleted

## 2020-10-03 DIAGNOSIS — F411 Generalized anxiety disorder: Secondary | ICD-10-CM

## 2020-10-04 MED ORDER — SERTRALINE HCL 50 MG PO TABS: 50.0000 mg | ORAL_TABLET | Freq: Every day | ORAL | 3 refills | Status: DC

## 2020-10-17 ENCOUNTER — Telehealth: Payer: 59 | Admitting: Internal Medicine

## 2020-10-17 DIAGNOSIS — E782 Mixed hyperlipidemia: Secondary | ICD-10-CM

## 2020-10-17 DIAGNOSIS — E559 Vitamin D deficiency, unspecified: Secondary | ICD-10-CM

## 2020-10-17 DIAGNOSIS — I10 Essential (primary) hypertension: Secondary | ICD-10-CM

## 2020-10-17 DIAGNOSIS — E119 Type 2 diabetes mellitus without complications: Secondary | ICD-10-CM

## 2020-10-21 ENCOUNTER — Other Ambulatory Visit: Payer: 59

## 2020-10-23 ENCOUNTER — Other Ambulatory Visit: Payer: Self-pay

## 2020-10-23 ENCOUNTER — Encounter: Payer: Self-pay | Admitting: Internal Medicine

## 2020-10-23 ENCOUNTER — Telehealth (INDEPENDENT_AMBULATORY_CARE_PROVIDER_SITE_OTHER): Payer: 59 | Admitting: Internal Medicine

## 2020-10-23 DIAGNOSIS — E119 Type 2 diabetes mellitus without complications: Secondary | ICD-10-CM

## 2020-10-23 DIAGNOSIS — I10 Essential (primary) hypertension: Secondary | ICD-10-CM

## 2020-10-23 DIAGNOSIS — E782 Mixed hyperlipidemia: Secondary | ICD-10-CM | POA: Diagnosis not present

## 2020-10-23 DIAGNOSIS — E559 Vitamin D deficiency, unspecified: Secondary | ICD-10-CM | POA: Diagnosis not present

## 2020-10-23 NOTE — Progress Notes (Signed)
Virtual Visit via Telephone Note  I connected with Jessica Ponce, on 10/23/2020 at 10:44 AM by telephone due to the COVID-19 pandemic and verified that I am speaking with the correct person using two identifiers.   Consent: I discussed the limitations, risks, security and privacy concerns of performing an evaluation and management service by telephone and the availability of in person appointments. I also discussed with the patient that there may be a patient responsible charge related to this service. The patient expressed understanding and agreed to proceed.   Location of Patient: Home   Location of Provider: Clinic    Persons participating in Telemedicine visit: Jessica Ponce [facility] Dr. Juleen China      History of Present Illness: Patient has a visit to f/u chronic medical conditions.   Chronic HTN Disease Monitoring:  Home BP Monitoring - Not monitoring  Chest pain- no  Dyspnea- no Headache - no  Medications: Amlodipine 10 mg, Losartan 100 mg  Compliance- yes Lightheadedness- no  Edema- dependent edema with sitting prolonged periods     Diabetes mellitus, Type 2 Disease Monitoring             Blood Sugar Ranges: Not monitoring. Says supplies were on back order.              Polyuria: no             Visual problems: no   Urine Microalbumin 8 (May 2021)---on Arb   Last A1C: 9.5 (Aug 2021)   Medications: Not on any medications. Has declined in past and has wanted diet and exercise trial.  Medication Compliance: N/A  Medication Side Effects             Hypoglycemia: no      Past Medical History:  Diagnosis Date  . Allergy   . Asthma    no current problems, does not use inhaler  . Diabetes mellitus without complication ([place])    type 2  . Essential hypertension   . Hyperlipidemia    Allergies  Allergen Reactions  . Valsartan Diarrhea  . Atorvastatin Other (See Comments)    Elevated sugars  . Codeine Nausea Only     vomiting vomitting    Current Outpatient Medications on File Prior to Visit  Medication Sig Dispense Refill  . amLODipine (NORVASC) 10 MG tablet Take 1 tablet (10 mg total) by mouth daily. 90 tablet 0  . Blood Glucose Monitoring Suppl (ONE TOUCH ULTRA MINI) w/Device KIT Used to check fasting FSBS daily. Dx: E11.9 1 kit 0  . glucose blood (ONETOUCH ULTRA) test strip Use to check fasting FSBS daily. Dx: E11.9 100 each 0  . Lancets (ONETOUCH ULTRASOFT) lancets Use to check fasting FSBS daily. Dx: E11.9 100 each 12  . losartan (COZAAR) 100 MG tablet Take 1 tablet (100 mg total) by mouth daily. 90 tablet 0  . sertraline (ZOLOFT) 50 MG tablet Take 1 tablet (50 mg total) by mouth daily. 30 tablet 3   No current facility-administered medications on file prior to visit.    Observations/Objective: NAD. Speaking clearly.  Work of breathing normal.  Alert and oriented. Mood appropriate.   Assessment and Plan: 1. Type 2 diabetes mellitus without complication, without long-term current use of insulin (HCC) Last A1c with poor glucose control. Patient has had 6 month trial of diet and exercise. Will follow A1c and if still elevated recommend medication therapy. Has been unable to tolerate Metformin in past. She is willing to trial medication other than that  pending A1c result. Does say that with change in job and the stress, she has fallen off her diet regimen. Encouraged patient to contact pharmacy regarding diabetic monitoring supplies.  - Hemoglobin A1c; Future - Lipid Panel; Future - Ambulatory referral to Ophthalmology  2. Essential hypertension Not monitoring at home. Reviewed that prior tend has been fairly stable. Asymptomatic. Continue current regimen.  - Lipid Panel; Future  3. Mixed hyperlipidemia - Lipid Panel; Future  4. Vitamin D deficiency Vit D level 18 in Aug 2021. Taking Vit D 5000 IU OTC. Reports some difficulty remembering to take medication daily. Monitor level.  - Vitamin  D, 25-hydroxy; Future   Follow Up Instructions: Lab visit 12/1    I discussed the assessment and treatment plan with the patient. The patient was provided an opportunity to ask questions and all were answered. The patient agreed with the plan and demonstrated an understanding of the instructions.   The patient was advised to call back or seek an in-person evaluation if the symptoms worsen or if the condition fails to improve as anticipated.     I provided 16 minutes total of non-face-to-face time during this encounter including median intraservice time, reviewing previous notes, investigations, ordering medications, medical decision making, coordinating care and patient verbalized understanding at the end of the visit.    Phill Myron, D.O. Primary Care at [facility]  10/23/2020, 10:44 AM

## 2020-10-30 ENCOUNTER — Other Ambulatory Visit (INDEPENDENT_AMBULATORY_CARE_PROVIDER_SITE_OTHER): Payer: 59

## 2020-10-30 ENCOUNTER — Other Ambulatory Visit: Payer: Self-pay

## 2020-10-30 DIAGNOSIS — E559 Vitamin D deficiency, unspecified: Secondary | ICD-10-CM

## 2020-10-30 DIAGNOSIS — E119 Type 2 diabetes mellitus without complications: Secondary | ICD-10-CM

## 2020-10-30 DIAGNOSIS — E782 Mixed hyperlipidemia: Secondary | ICD-10-CM | POA: Diagnosis not present

## 2020-10-30 DIAGNOSIS — I10 Essential (primary) hypertension: Secondary | ICD-10-CM

## 2020-10-31 ENCOUNTER — Other Ambulatory Visit: Payer: Self-pay | Admitting: Family Medicine

## 2020-10-31 LAB — HEMOGLOBIN A1C
Est. average glucose Bld gHb Est-mCnc: 283 mg/dL
Hgb A1c MFr Bld: 11.5 % — ABNORMAL HIGH (ref 4.8–5.6)

## 2020-10-31 LAB — LIPID PANEL
Chol/HDL Ratio: 5.4 ratio — ABNORMAL HIGH (ref 0.0–4.4)
Cholesterol, Total: 297 mg/dL — ABNORMAL HIGH (ref 100–199)
HDL: 55 mg/dL (ref >39)
LDL Chol Calc (NIH): 195 mg/dL — ABNORMAL HIGH (ref 0–99)
Triglycerides: 242 mg/dL — ABNORMAL HIGH (ref 0–149)
VLDL Cholesterol Cal: 47 mg/dL — ABNORMAL HIGH (ref 5–40)

## 2020-10-31 LAB — VITAMIN D 25 HYDROXY (VIT D DEFICIENCY, FRACTURES): Vit D, 25-Hydroxy: 21 ng/mL — ABNORMAL LOW (ref 30.0–100.0)

## 2020-10-31 MED ORDER — ERGOCALCIFEROL 1.25 MG (50000 UT) PO CAPS: 50000.0000 [IU] | ORAL_CAPSULE | ORAL | 0 refills | Status: DC

## 2020-10-31 MED ORDER — ROSUVASTATIN CALCIUM 10 MG PO TABS: 10.0000 mg | ORAL_TABLET | Freq: Every day | ORAL | 3 refills | Status: DC

## 2020-11-01 NOTE — Progress Notes (Signed)
Patient notified of results & recommendations. Expressed understanding.

## 2020-11-13 ENCOUNTER — Encounter: Payer: Self-pay | Admitting: Internal Medicine

## 2020-11-13 ENCOUNTER — Ambulatory Visit (INDEPENDENT_AMBULATORY_CARE_PROVIDER_SITE_OTHER): Payer: 59 | Admitting: Internal Medicine

## 2020-11-13 ENCOUNTER — Other Ambulatory Visit: Payer: Self-pay

## 2020-11-13 VITALS — BP 129/84 | HR 74 | Temp 97.3°F | Resp 17 | Wt 190.0 lb

## 2020-11-13 DIAGNOSIS — E1165 Type 2 diabetes mellitus with hyperglycemia: Secondary | ICD-10-CM

## 2020-11-13 MED ORDER — OZEMPIC (0.25 OR 0.5 MG/DOSE) 2 MG/1.5ML ~~LOC~~ SOPN: PEN_INJECTOR | SUBCUTANEOUS | 2 refills | Status: DC

## 2020-11-13 NOTE — Progress Notes (Signed)
vic  Subjective:    Jessica Ponce - 54 y.o. female MRN [id number]  Date of birth: 1966-10-18  HPI  Jessica Ponce is here for follow up of T2DM. A1c trend 9.5 >11.5 with trial of diet and exercise. Has not wanted to initiate medications in the recent past, now willing today. Intolerant of Metformin. Has been on insulin in the past, would like to avoid this. Is willing to do other injectables---preferably limited to once per week. 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 Fasting CBGs in the 200s.      Health Maintenance:  Health Maintenance Due  Topic Date Due  . FOOT EXAM  Never done  . OPHTHALMOLOGY EXAM  Never done  . COVID-19 Vaccine (3 - Booster for Pfizer series) 10/13/2020    -  reports that she has never smoked. She has never used smokeless tobacco. - Review of Systems: Per HPI. - Past Medical History: Patient Active Problem List   Diagnosis Date Noted  . Family history of thyroid disorder 07/15/2020  . Mixed hyperlipidemia 04/23/2020  . Family history of breast cancer 03/11/2020  . Type 2 diabetes mellitus ([place]) 03/11/2020  . Hypertension, essential 07/31/2016   - Medications: reviewed and updated   Objective:   Physical Exam LMP 12/10/2015 (LMP Unknown)  Physical Exam Constitutional:      General: She is not in acute distress.    Appearance: She is not diaphoretic.  Cardiovascular:     Rate and Rhythm: Normal rate.  Pulmonary:     Effort: Pulmonary effort is normal. No respiratory distress.  Musculoskeletal:        General: Normal range of motion.  Skin:    General: Skin is warm and dry.  Neurological:     Mental Status: She is alert and oriented to person, place, and time.  Psychiatric:        Mood and Affect: Affect normal.        Judgment: Judgment normal.            Assessment & Plan:   1. Uncontrolled type 2  diabetes mellitus with hyperglycemia (HCC) Uncontrolled T2DM with A1c >11. Start Ozempic and increase per instructions below. Offer visit with Benard Halsted, PharmD for medication administration assistance. Return in 6 weeks with fasting glucose log. Discussed importance of diet and exercise in addition to medication.  Counseled on Diabetic diet, my plate method, 003 minutes of moderate intensity exercise/week Blood sugar logs with fasting goals of 80-120 mg/dl, random of less than 180 and in the event of sugars less than 60 mg/dl or greater than 400 mg/dl encouraged to notify the clinic. Advised on the need for annual eye exams, annual foot exams, Pneumonia vaccine. - HM Diabetes Foot Exam - Semaglutide,0.25 or 0.5MG /DOS, (OZEMPIC, 0.25 OR 0.5 MG/DOSE,) 2 MG/1.5ML SOPN; Administer 0.25 mg once weekly x4 weeks. Then increase to 0.5 mg weekly.  Dispense: 1.5 mL; Refill: [place], D.O. 11/13/2020, 2:21 PM Primary Care at [facility]

## 2020-11-25 NOTE — Progress Notes (Unsigned)
S:     PCP: Dr. Juleen China PMH: DM, HTN, HLD  Patient arrives in good spirits. Presents for diabetes evaluation, education, and management Patient was referred and last seen by Primary Care Provider on 11/13/20 at which she was started on Ozempic 0.25 mg weekly.   Today, patient reports ***  A1c trend 9.5 >11.5 with trial of diet and exercise  Intolerant of Metformin Has been on insulin in the past, would like to avoid this.  A1C = Check Clinic BG Review medications and adherence (timing of meds, etc.)  Ate or drank anything prior to visit today? At home BGs?  Marland Kitchen Highs . Lows  Hyperglycemia sx (nocturia, neuropathy, visual changes, foot exams) Hypoglycemia symptoms (dizziness, shaky, sweating, hungry, confusion) Diet Exercise  Plan: 1.5 week f/u after starting Ozempic -  Patient reports Diabetes was diagnosed in ***.   Family/Social History: ***  Insurance coverage/medication affordability: [employer] and Medicaid   Medication adherence reported *** .   Current diabetes medications include: Ozempic 0.25 mg weekly Current hypertension medications include: losartan 100 mg daily, amlodipine 10 mg daily Current hyperlipidemia medications include: rosuvastatin 10 mg daily   Patient {Actions; denies-reports:120008} hypoglycemic events.  Patient reported dietary habits: Eats *** meals/day Breakfast:*** Lunch:*** Dinner:*** Snacks:*** Drinks:***  Patient-reported exercise habits: ***   Patient {Actions; denies-reports:120008} nocturia (nighttime urination).  Patient {Actions; denies-reports:120008} neuropathy (nerve pain). Patient {Actions; denies-reports:120008} visual changes. Patient {Actions; denies-reports:120008} self foot exams.     O:  Physical Exam   ROS   Lab Results  Component Value Date   HGBA1C 11.5 (H) 10/30/2020   There were no vitals filed for this visit.  Lipid Panel     Component Value Date/Time   CHOL 297 (H) 10/30/2020 1003    TRIG 242 (H) 10/30/2020 1003   HDL 55 10/30/2020 1003   CHOLHDL 5.4 (H) 10/30/2020 1003   LDLCALC 195 (H) 10/30/2020 1003    Home fasting blood sugars: ***  2 hour post-meal/random blood sugars: ***.   Clinical Atherosclerotic Cardiovascular Disease (ASCVD): {YES/NO:21197} The 10-year ASCVD risk score Mikey Bussing DC Jr., et al., 2013) is: 7.8%   Values used to calculate the score:     Age: 54 years     Sex: Female     Is Non-Hispanic African American: No     Diabetic: Yes     Tobacco smoker: No     Systolic Blood Pressure: [id number] mmHg     Is BP treated: Yes     HDL Cholesterol: 55 mg/dL     Total Cholesterol: 297 mg/dL    A/P: Diabetes longstanding*** currently ***. Patient is *** able to verbalize appropriate hypoglycemia management plan. Medication adherence appears ***. Control is suboptimal due to ***. -{Meds adjust:18428} basal insulin *** (insulin ***). Patient will continue to titrate 1 unit every *** days if fasting blood sugar > 100mg /dl until fasting blood sugars reach goal or next visit.  -{Meds adjust:18428}  rapid insulin *** (insulin ***) to ***.  -{Meds adjust:18428} GLP-1 *** (generic name***) to ***.  -{Meds adjust:18428} SGLT2-I *** (generic name***) to ***. Counseled on sick day rules for ***. -Extensively discussed pathophysiology of diabetes, recommended lifestyle interventions, dietary effects on blood sugar control -Counseled on s/sx of and management of hypoglycemia -Next A1C anticipated ***.   ASCVD risk - primary***secondary prevention in patient with diabetes. Last LDL {Is/is not:9024} controlled. ASCVD risk score {Is/is not:9024} >20%  - {Desc; low/moderate/high:110033} intensity statin indicated. Aspirin {Is/is not:9024} indicated.  -{Meds adjust:18428} aspirin ***  mg  -{Meds adjust:18428} ***statin *** mg.   Written patient instructions provided.  Total time in face to face counseling *** minutes.   Follow up Pharmacist/PCP*** Clinic Visit in ***.     Lorel Monaco, PharmD, [place] PGY2 Ambulatory Care Resident [place]

## 2020-11-27 ENCOUNTER — Ambulatory Visit: Payer: 59 | Admitting: Pharmacist

## 2020-12-03 NOTE — Progress Notes (Signed)
    S:     PCP: Dr. Earlene Plater  Patient arrives in good spirits. Presents for diabetes evaluation, education, and management. Patient was referred and last seen by Primary Care Provider on 11/13/20 and was started on Ozempic 0.25 mg weekly.   Today, patient brought Ozempic pen and reports not starting Ozempic due to needing injection training. Reports home sugars in 200s. Denies sugars <70.   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   Patient reports nocturia (nighttime urination). (1x/night) Patient denies neuropathy (nerve pain). Patient denies visual changes. Patient reports self foot exams.     O:  Home fasting blood sugars: 200s  Lab Results  Component Value Date   HGBA1C 11.5 (H) 10/30/2020   There were no vitals filed for this visit.  Lipid Panel     Component Value Date/Time   CHOL 297 (H) 10/30/2020 1003   TRIG 242 (H) 10/30/2020 1003   HDL 55 10/30/2020 1003   CHOLHDL 5.4 (H) 10/30/2020 1003   LDLCALC 195 (H) 10/30/2020 1003    Clinical Atherosclerotic Cardiovascular Disease (ASCVD): No  The 10-year ASCVD risk score Denman George DC Jr., et al., 2013) is: 7.8%   Values used to calculate the score:     Age: 74 years     Sex: Female     Is Non-Hispanic African American: No     Diabetic: Yes     Tobacco smoker: No     Systolic Blood Pressure: 129 mmHg     Is BP treated: Yes     HDL Cholesterol: 55 mg/dL     Total Cholesterol: 297 mg/dL    A/P: Diabetes longstanding currently uncontrolled. Patient requested injection training for Ozempic. Discussed clinical  benefits, potential side effects, and demonstrated proper injection technique with teach back method. Patient self-administered first dose in clinic today. Patient is able to verbalize appropriate hypoglycemia management plan. Wil address diet and exercise at next visit. -Provided injection training for Ozempic 0.25 mg weekly - first dose administered today -Extensively discussed pathophysiology of diabetes, recommended lifestyle interventions, dietary effects on blood sugar control -Counseled on s/sx of and management of hypoglycemia -Next A1C anticipated March 2022.   ASCVD risk - primary prevention in patient with diabetes. Last LDL is not controlled. ASCVD risk score is not >20%  - moderate intensity statin indicated. Will plan to discuss medication adherence at next visit -Continued rosuvastatin 10 mg daily  Written patient instructions provided.  Total time in face to face counseling 25 minutes.   Follow up Pharmacist Clinic Visit in 4 weeks    Fabio Neighbors, PharmD, BCPS PGY2 Ambulatory Care Resident [facility]  [facility]arm[facility]

## 2020-12-04 ENCOUNTER — Ambulatory Visit: Payer: Managed Care, Other (non HMO) | Attending: Internal Medicine | Admitting: Pharmacist

## 2020-12-04 ENCOUNTER — Other Ambulatory Visit: Payer: Self-pay

## 2020-12-04 DIAGNOSIS — E119 Type 2 diabetes mellitus without complications: Secondary | ICD-10-CM

## 2020-12-09 ENCOUNTER — Other Ambulatory Visit: Payer: Self-pay | Admitting: Family Medicine

## 2020-12-11 ENCOUNTER — Telehealth: Payer: Self-pay

## 2020-12-11 ENCOUNTER — Other Ambulatory Visit: Payer: Self-pay

## 2020-12-11 DIAGNOSIS — I10 Essential (primary) hypertension: Secondary | ICD-10-CM

## 2020-12-11 MED ORDER — LOSARTAN POTASSIUM 100 MG PO TABS: 100.0000 mg | ORAL_TABLET | Freq: Every day | ORAL | 0 refills | Status: DC

## 2020-12-11 MED ORDER — AMLODIPINE BESYLATE 10 MG PO TABS: 10.0000 mg | ORAL_TABLET | Freq: Every day | ORAL | 0 refills | Status: DC

## 2020-12-11 NOTE — Telephone Encounter (Signed)
Refills sent

## 2020-12-12 ENCOUNTER — Telehealth: Payer: Self-pay

## 2020-12-12 DIAGNOSIS — I10 Essential (primary) hypertension: Secondary | ICD-10-CM

## 2020-12-12 NOTE — Telephone Encounter (Signed)
These RFs were sent to [facility] per pt request on yesterday, will follow-up for scripts to be transferred to [facility]

## 2020-12-13 MED ORDER — LOSARTAN POTASSIUM 100 MG PO TABS: 100.0000 mg | ORAL_TABLET | Freq: Every day | ORAL | 0 refills | Status: DC

## 2020-12-13 MED ORDER — AMLODIPINE BESYLATE 10 MG PO TABS: 10.0000 mg | ORAL_TABLET | Freq: Every day | ORAL | 0 refills | Status: DC

## 2020-12-13 NOTE — Telephone Encounter (Signed)
RFs forwarded to [place], pt aware

## 2020-12-19 ENCOUNTER — Telehealth: Payer: Self-pay

## 2020-12-19 NOTE — Telephone Encounter (Signed)
PA started for pt in CoverMyMeds for Ozempic .25mg  Key# [id number] RESPONSE:CaseId:[id number];Status:Approved;Review Type:Prior Auth;Coverage Start Date:11/19/2020;Coverage End Date:12/19/2023;

## 2020-12-25 ENCOUNTER — Encounter: Payer: Self-pay | Admitting: Internal Medicine

## 2020-12-25 ENCOUNTER — Telehealth (INDEPENDENT_AMBULATORY_CARE_PROVIDER_SITE_OTHER): Payer: Managed Care, Other (non HMO) | Admitting: Internal Medicine

## 2020-12-25 VITALS — Ht 64.5 in | Wt 188.0 lb

## 2020-12-25 DIAGNOSIS — E119 Type 2 diabetes mellitus without complications: Secondary | ICD-10-CM | POA: Diagnosis not present

## 2020-12-25 NOTE — Progress Notes (Signed)
Virtual Visit via Telephone Note  I connected with Jessica Ponce, on 12/25/2020 at 9:47 AM by telephone due to the COVID-19 pandemic and verified that I am speaking with the correct person using two identifiers.   Consent: I discussed the limitations, risks, security and privacy concerns of performing an evaluation and management service by telephone and the availability of in person appointments. I also discussed with the patient that there may be a patient responsible charge related to this service. The patient expressed understanding and agreed to proceed.   Location of Patient: Home   Location of Provider: Clinic    Persons participating in Telemedicine visit: [facility] Dr. Juleen China      History of Present Illness: Patient has follow up for T2DM. At last visit, 12/15, discussed worsening A1c trend despite trial of lifestyle changes with A1c >11%. Patient was agreeable to starting medication and Ozempic was prescribed. She wanted to avoid daily insulin dosing. She had f/u with Benard Halsted, PharmD on 1/5 who showed her how to use injectable and first dose was administered at that office visit.   Today patient reports that the first two weeks with Ozempic were pretty rough as caused headaches and stomachaches/reflux. Says these side effects are better now.   Fasting CBGs have been mid 200s. Hasn't seen much of an improvement, maybe was previously in the upper 200s. She is increasing Ozempic next week to 0.5 mg.    Past Medical History:  Diagnosis Date  . Allergy   . Asthma    no current problems, does not use inhaler  . Diabetes mellitus without complication ([place])    type 2  . Essential hypertension   . Hyperlipidemia    Allergies  Allergen Reactions  . Valsartan Diarrhea  . Atorvastatin Other (See Comments)    Elevated sugars  . Codeine Nausea Only    vomiting vomitting    Current Outpatient Medications on File Prior to Visit  Medication Sig  Dispense Refill  . amLODipine (NORVASC) 10 MG tablet Take 1 tablet (10 mg total) by mouth daily. 90 tablet 0  . Blood Glucose Monitoring Suppl (ONE TOUCH ULTRA MINI) w/Device KIT Used to check fasting FSBS daily. Dx: E11.9 1 kit 0  . ergocalciferol (DRISDOL) 1.25 MG (50000 UT) capsule Take 1 capsule (50,000 Units total) by mouth once a week. 12 capsule 0  . glucose blood (ONETOUCH ULTRA) test strip Use to check fasting FSBS daily. Dx: E11.9 100 each 0  . Lancets (ONETOUCH ULTRASOFT) lancets Use to check fasting FSBS daily. Dx: E11.9 100 each 12  . losartan (COZAAR) 100 MG tablet Take 1 tablet (100 mg total) by mouth daily. 90 tablet 0  . rosuvastatin (CRESTOR) 10 MG tablet Take 1 tablet (10 mg total) by mouth daily. 30 tablet 3  . Semaglutide,0.25 or 0.5MG/DOS, (OZEMPIC, 0.25 OR 0.5 MG/DOSE,) 2 MG/1.5ML SOPN Administer 0.25 mg once weekly x4 weeks. Then increase to 0.5 mg weekly. 1.5 mL 2  . sertraline (ZOLOFT) 50 MG tablet Take 1 tablet (50 mg total) by mouth daily. 30 tablet 3   No current facility-administered medications on file prior to visit.    Observations/Objective: NAD. Speaking clearly.  Work of breathing normal.  Alert and oriented. Mood appropriate.   Assessment and Plan: 1. Type 2 diabetes mellitus without complication, without long-term current use of insulin (HCC) Poorly controlled at present. Discussed with patient that Ozempic 0.25 mg is not treatment dose for DM and would not anticipate much change  in glycemic control. Education provided that starting at lower dose is done in attempt to mitigate GI side effects. These have now improved for patient and she will be increasing to treatment dose of 0.5 mg at her next administration. Encouraged continued healthy eating habits in light of her chronic disease. She has follow up with Benard Halsted, PharmD on 2/2. Will schedule f/u with this provider in March for A1c monitoring.    Follow Up Instructions: DM visit in March     I discussed the assessment and treatment plan with the patient. The patient was provided an opportunity to ask questions and all were answered. The patient agreed with the plan and demonstrated an understanding of the instructions.   The patient was advised to call back or seek an in-person evaluation if the symptoms worsen or if the condition fails to improve as anticipated.     I provided 8 minutes total of non-face-to-face time during this encounter including median intraservice time, reviewing previous notes, investigations, ordering medications, medical decision making, coordinating care and patient verbalized understanding at the end of the visit.    Phill Myron, D.O. Primary Care at [facility]  12/25/2020, 9:47 AM

## 2020-12-25 NOTE — Progress Notes (Signed)
Follow-up for new diabetic med started at last visit  Stopped Crestor, reports caused elevated blood sugar readings

## 2021-01-01 ENCOUNTER — Ambulatory Visit: Payer: Managed Care, Other (non HMO) | Admitting: Pharmacist

## 2021-01-07 ENCOUNTER — Telehealth: Payer: Self-pay

## 2021-01-07 NOTE — Patient Instructions (Incomplete)
Please continue using your CPAP regularly. While your insurance requires that you use CPAP at least 4 hours each night on 70% of the nights, I recommend, that you not skip any nights and use it throughout the night if you can. Getting used to CPAP and staying with the treatment long term does take time and patience and discipline. Untreated obstructive sleep apnea when it is moderate to severe can have an adverse impact on cardiovascular health and raise her risk for heart disease, arrhythmias, hypertension, congestive heart failure, stroke and diabetes. Untreated obstructive sleep apnea causes sleep disruption, nonrestorative sleep, and sleep deprivation. This can have an impact on your day to day functioning and cause daytime sleepiness and impairment of cognitive function, memory loss, mood disturbance, and problems focussing. Using CPAP regularly can improve these symptoms.   Sleep Apnea Sleep apnea affects breathing during sleep. It causes breathing to stop for a short time or to become shallow. It can also increase the risk of:  Heart attack.  Stroke.  Being very overweight (obese).  Diabetes.  Heart failure.  Irregular heartbeat. The goal of treatment is to help you breathe normally again. What are the causes? There are three kinds of sleep apnea:  Obstructive sleep apnea. This is caused by a blocked or collapsed airway.  Central sleep apnea. This happens when the brain does not send the right signals to the muscles that control breathing.  Mixed sleep apnea. This is a combination of obstructive and central sleep apnea. The most common cause of this condition is a collapsed or blocked airway. This can happen if:  Your throat muscles are too relaxed.  Your tongue and tonsils are too large.  You are overweight.  Your airway is too small.   What increases the risk?  Being overweight.  Smoking.  Having a small airway.  Being older.  Being female.  Drinking  alcohol.  Taking medicines to calm yourself (sedatives or tranquilizers).  Having family members with the condition. What are the signs or symptoms?  Trouble staying asleep.  Being sleepy or tired during the day.  Getting angry a lot.  Loud snoring.  Headaches in the morning.  Not being able to focus your mind (concentrate).  Forgetting things.  Less interest in sex.  Mood swings.  Personality changes.  Feelings of sadness (depression).  Waking up a lot during the night to pee (urinate).  Dry mouth.  Sore throat. How is this diagnosed?  Your medical history.  A physical exam.  A test that is done when you are sleeping (sleep study). The test is most often done in a sleep lab but may also be done at home. How is this treated?  Sleeping on your side.  Using a medicine to get rid of mucus in your nose (decongestant).  Avoiding the use of alcohol, medicines to help you relax, or certain pain medicines (narcotics).  Losing weight, if needed.  Changing your diet.  Not smoking.  Using a machine to open your airway while you sleep, such as: ? An oral appliance. This is a mouthpiece that shifts your lower jaw forward. ? A CPAP device. This device blows air through a mask when you breathe out (exhale). ? An EPAP device. This has valves that you put in each nostril. ? A BPAP device. This device blows air through a mask when you breathe in (inhale) and breathe out.  Having surgery if other treatments do not work. It is important to get treatment for sleep  apnea. Without treatment, it can lead to:  High blood pressure.  Coronary artery disease.  In men, not being able to have an erection (impotence).  Reduced thinking ability.   Follow these instructions at home: Lifestyle  Make changes that your doctor recommends.  Eat a healthy diet.  Lose weight if needed.  Avoid alcohol, medicines to help you relax, and some pain medicines.  Do not use any  products that contain nicotine or tobacco, such as cigarettes, e-cigarettes, and chewing tobacco. If you need help quitting, ask your doctor. General instructions  Take over-the-counter and prescription medicines only as told by your doctor.  If you were given a machine to use while you sleep, use it only as told by your doctor.  If you are having surgery, make sure to tell your doctor you have sleep apnea. You may need to bring your device with you.  Keep all follow-up visits as told by your doctor. This is important. Contact a doctor if:  The machine that you were given to use during sleep bothers you or does not seem to be working.  You do not get better.  You get worse. Get help right away if:  Your chest hurts.  You have trouble breathing in enough air.  You have an uncomfortable feeling in your back, arms, or stomach.  You have trouble talking.  One side of your body feels weak.  A part of your face is hanging down. These symptoms may be an emergency. Do not wait to see if the symptoms will go away. Get medical help right away. Call your local emergency services (911 in the [place] Do not drive yourself to the hospital. Summary  This condition affects breathing during sleep.  The most common cause is a collapsed or blocked airway.  The goal of treatment is to help you breathe normally while you sleep. This information is not intended to replace advice given to you by your health care provider. Make sure you discuss any questions you have with your health care provider. Document Revised: 09/02/2018 Document Reviewed: 07/12/2018 Elsevier Patient Education  [place].

## 2021-01-07 NOTE — Telephone Encounter (Signed)
Pt called, I have to cancel the appt for tomorrow. I have not received my CPAP machine. Was told CPAP machine is on back order. I'm going to call [facility] to check on when I will receive my CPAP machine. Would like a call from the nurse.

## 2021-01-07 NOTE — Telephone Encounter (Signed)
LVM, not able to view cpap data  Asked pt to bring cpap and cord to appt.

## 2021-01-07 NOTE — Progress Notes (Deleted)
PATIENT: Jessica Ponce DOB: 07-04-1966  REASON FOR VISIT: follow up HISTORY FROM: patient  No chief complaint on file.    HISTORY OF PRESENT ILLNESS: 01/07/21 ALL:  Teressa Mcglocklin is a 55 y.o. female here today for follow up for OSA on CPAP. She was seen 9/21 for concerns of snoring and daytime somnolence.  HST 09/16/2020 showed "mild obstructive sleep apnea with a total AHI of 11.7/hour and O2 nadir of 84%".   Compliance report dated    HISTORY: (copied from Dr Guadelupe Sabin previous note)  Dear [name],  I saw your patient, Jessica Ponce, upon your kind request, in my Sleep clinic today for initial consultation of her sleep disorder, in particular, concern for underlying obstructive sleep apnea.  The patient is unaccompanied today.  As you know, Ms. Nofsinger is a 56 year old right-handed woman with an underlying medical history of diabetes, hypertension, hyperlipidemia, allergies, asthma, and obesity, who reports snoring and excessive daytime somnolence.  I reviewed your office note from 07/15/2020.  Her Epworth sleepiness score is 8 out of 24, fatigue severity score is 37 out of 63.  She has some trouble going to sleep and staying asleep.  Since she started Zoloft for anxiety, her sleep difficulties have improved a little bit.  She has no family history of sleep apnea.  She has had occasional morning headaches and has nocturia about once or twice per average night.  She lives with her family which includes her husband and younger daughter, aged 8, she also has a grandson, age 41 who lives in the [place] area.  She goes to bed between 11 and midnight and rise time is around 7 but she is often up by 6.  They have 2 cats in the household which do not sleep in their bedroom typically.  She has a TV in the bedroom but does not watch it at night.  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  She works from 12:30 PM to 9 PM.  In the past, when she had difficulty going to sleep or staying  asleep she tried Unisom and also melatonin, melatonin did not help very much.  She does not currently take any over-the-counter sleep aid or prescription sleep aid.  She is a non-smoker, drinks alcohol occasionally in the form of wine and drinks caffeine in the form of coffee, 2 cups in the mornings and sometimes a 3rd cup in the early afternoon.  She has recently noticed trembling that affects her when she is still and mostly feels it inside, her husband has never seen it, she does not have any hand tremor or head tremor or leg tremor, no family history of tremors but paternal grandfather had Parkinson's disease.    REVIEW OF SYSTEMS: Out of a complete 14 system review of symptoms, the patient complains only of the following symptoms, and all other reviewed systems are negative.  Previous ESS 8/24, now  Previous FSS 37/63, now    ALLERGIES: Allergies  Allergen Reactions  . Valsartan Diarrhea  . Atorvastatin Other (See Comments)    Elevated sugars  . Codeine Nausea Only    vomiting vomitting    HOME MEDICATIONS: Outpatient Medications Prior to Visit  Medication Sig Dispense Refill  . amLODipine (NORVASC) 10 MG tablet Take 1 tablet (10 mg total) by mouth daily. 90 tablet 0  . Blood Glucose Monitoring Suppl (ONE TOUCH ULTRA MINI) w/Device KIT Used to check fasting FSBS daily. Dx: E11.9 1 kit 0  .  ergocalciferol (DRISDOL) 1.25 MG (50000 UT) capsule Take 1 capsule (50,000 Units total) by mouth once a week. 12 capsule 0  . glucose blood (ONETOUCH ULTRA) test strip Use to check fasting FSBS daily. Dx: E11.9 100 each 0  . Lancets (ONETOUCH ULTRASOFT) lancets Use to check fasting FSBS daily. Dx: E11.9 100 each 12  . losartan (COZAAR) 100 MG tablet Take 1 tablet (100 mg total) by mouth daily. 90 tablet 0  . rosuvastatin (CRESTOR) 10 MG tablet Take 1 tablet (10 mg total) by mouth daily. 30 tablet 3  . Semaglutide,0.25 or 0.5MG/DOS, (OZEMPIC, 0.25 OR 0.5 MG/DOSE,) 2 MG/1.5ML SOPN Administer 0.25  mg once weekly x4 weeks. Then increase to 0.5 mg weekly. 1.5 mL 2  . sertraline (ZOLOFT) 50 MG tablet Take 1 tablet (50 mg total) by mouth daily. 30 tablet 3   No facility-administered medications prior to visit.    PAST MEDICAL HISTORY: Past Medical History:  Diagnosis Date  . Allergy   . Asthma    no current problems, does not use inhaler  . Diabetes mellitus without complication ([place])    type 2  . Essential hypertension   . Hyperlipidemia     PAST SURGICAL HISTORY: Past Surgical History:  Procedure Laterality Date  . APPENDECTOMY  1985  . exploratory laparoscopy    . WISDOM TOOTH EXTRACTION      FAMILY HISTORY: Family History  Problem Relation Age of Onset  . Memory loss Mother   . Breast cancer Mother 48  . Breast cancer Maternal Aunt 74  . Colon cancer Neg Hx   . Rectal cancer Neg Hx   . Stomach cancer Neg Hx   . Esophageal cancer Neg Hx     SOCIAL HISTORY: Social History   Socioeconomic History  . Marital status: Married    Spouse name: Legrand Como  . Number of children: 2  . Years of education: 71  . Highest education level: Not on file  Occupational History  . Not on file  Tobacco Use  . Smoking status: Never Smoker  . Smokeless tobacco: Never Used  Vaping Use  . Vaping Use: Never used  Substance and Sexual Activity  . Alcohol use: Yes    Alcohol/week: 0.0 standard drinks    Comment: occasional wine  . Drug use: No  . Sexual activity: Yes    Birth control/protection: Post-menopausal  Other Topics Concern  . Not on file  Social History Narrative   Lives with husband and 2 kids   Caffeine use: Drinks 2-3 cups coffee per day   Social Determinants of Health   Financial Resource Strain: Not on file  Food Insecurity: Not on file  Transportation Needs: Not on file  Physical Activity: Not on file  Stress: Not on file  Social Connections: Not on file  Intimate Partner Violence: Not on file     PHYSICAL EXAM  There were no vitals filed for  this visit. There is no height or weight on file to calculate BMI.  Generalized: Well developed, in no acute distress  Cardiology: normal rate and rhythm, no murmur noted Respiratory: clear to auscultation bilaterally  Neurological examination  Mentation: Alert oriented to time, place, history taking. Follows all commands speech and language fluent Cranial nerve II-XII: Pupils were equal round reactive to light. Extraocular movements were full, visual field were full  Motor: The motor testing reveals 5 over 5 strength of all 4 extremities. Good symmetric motor tone is noted throughout.  Gait and station: Gait is  normal.    DIAGNOSTIC DATA (LABS, IMAGING, TESTING) - I reviewed patient records, labs, notes, testing and imaging myself where available.  No flowsheet data found.   Lab Results  Component Value Date   WBC 7.7 04/19/2020   HGB 14.9 04/19/2020   HCT 43.5 04/19/2020   MCV 87 04/19/2020   PLT 309 04/19/2020      Component Value Date/Time   NA 139 04/19/2020 1025   K 4.5 04/19/2020 1025   CL 100 04/19/2020 1025   CO2 22 04/19/2020 1025   GLUCOSE 242 (H) 04/19/2020 1025   GLUCOSE 121 (H) 12/12/2015 1500   BUN 16 04/19/2020 1025   CREATININE 0.80 04/19/2020 1025   CALCIUM 9.5 04/19/2020 1025   PROT 7.4 04/19/2020 1025   ALBUMIN 4.8 04/19/2020 1025   AST 11 04/19/2020 1025   ALT 14 04/19/2020 1025   ALKPHOS 84 04/19/2020 1025   BILITOT 0.4 04/19/2020 1025   GFRNONAA 84 04/19/2020 1025   GFRAA 97 04/19/2020 1025   Lab Results  Component Value Date   CHOL 297 (H) 10/30/2020   HDL 55 10/30/2020   LDLCALC 195 (H) 10/30/2020   TRIG 242 (H) 10/30/2020   CHOLHDL 5.4 (H) 10/30/2020   Lab Results  Component Value Date   HGBA1C 11.5 (H) 10/30/2020   No results found for: VITAMINB12 Lab Results  Component Value Date   TSH 3.150 07/15/2020     ASSESSMENT AND PLAN 55 y.o. year old female  has a past medical history of Allergy, Asthma, Diabetes mellitus without  complication ([place]), Essential hypertension, and Hyperlipidemia. here with ***    ICD-10-CM   1. OSA on CPAP  G47.33    Z99.89      Cleaster Shiffer is doing well on CPAP therapy. Compliance report reveals ***. *** was encouraged to continue using CPAP nightly and for greater than 4 hours each night. We will update supply orders as indicated. Risks of untreated sleep apnea review and education materials provided. Healthy lifestyle habits encouraged. *** will follow up in ***, sooner if needed. *** verbalizes understanding and agreement with this plan.   No orders of the defined types were placed in this encounter.    No orders of the defined types were placed in this encounter.     I spent 15 minutes with the patient. 50% of this time was spent counseling and educating patient on plan of care and medications.    Debbora Presto, FNP-C 01/07/2021, 4:35 PM [facility] [address] [place], [place] [address] [phone]

## 2021-01-07 NOTE — Progress Notes (Signed)
    S:     PCP: Dr. Juleen China  Patient arrives in good spirits. Presents for diabetes evaluation, education, and management. Patient was referred and last seen by Primary Care Provider on 12/25/20 and reported headaches and stomach aches/reflux with first 2 doses of Ozempic 0.25 mg weekly. Side effects have since improved. She endorses minimal improvement in FBG (mid 200s).  Today, patient reports medication adherence with Ozempic 0.5 mg weekly and has been taking for 2 weeks now. GI symptoms have not recurred. Reports FBG/PBG - 200-260s. Denies BG <70 mg/dL.  Family/Social History:  -Fhx: breast cancer in mother and aunt -Tobacco: denies  Insurance coverage/medication affordability: [employer] and Medicaid  Family Planning  Medication adherence reported good.   Current diabetes medications include: Ozempic 0.5 mg weekly (Wednesdays) Current hypertension medications include: losartan 100 mg daily, amlodipine 10 mg daily Current hyperlipidemia medications include: rosuvastatin 10 mg daily  Patient denies hypoglycemic events.  Patient reported dietary habits: will address at next visit  Patient-reported exercise habits: will address at next visit   Patient denies nocturia (nighttime urination).  Patient denies neuropathy (nerve pain). Patient denies visual changes. Patient reports self foot exams.    O:  POCT: 235 (post-prandial)  Home blood sugars: 200-260s  Lab Results  Component Value Date   HGBA1C 11.5 (H) 10/30/2020   There were no vitals filed for this visit.  Lipid Panel     Component Value Date/Time   CHOL 297 (H) 10/30/2020 1003   TRIG 242 (H) 10/30/2020 1003   HDL 55 10/30/2020 1003   CHOLHDL 5.4 (H) 10/30/2020 1003   LDLCALC 195 (H) 10/30/2020 1003    Clinical Atherosclerotic Cardiovascular Disease (ASCVD): No  The 10-year ASCVD risk score Mikey Bussing DC Jr., et al., 2013) is: 7.8%   Values used to calculate the score:     Age: 55 years     Sex: Female      Is Non-Hispanic African American: No     Diabetic: Yes     Tobacco smoker: No     Systolic Blood Pressure: 353 mmHg     Is BP treated: Yes     HDL Cholesterol: 55 mg/dL     Total Cholesterol: 297 mg/dL    A/P: Diabetes longstanding currently uncontrolled. Control is suboptimal due to elevated sugars. Medication adherence appears optimal. Patient is able to verbalize appropriate hypoglycemia management plan. Unable to discuss diet and exercise habits due to time constraints, will address at next visit. -Started Lantus 10 units daily -Continued Ozempic 0.5 mg weekly -Extensively discussed pathophysiology of diabetes, recommended lifestyle interventions, dietary effects on blood sugar control -Counseled on s/sx of and management of hypoglycemia -Next A1C anticipated March 2022  ASCVD risk - primary prevention in patient with diabetes. Last LDL is not controlled. ASCVD risk score is not >20%  - moderate/high intensity statin indicated. Will assess statin adherence at next visit. -Continued rosuvastatin 10 mg daily  Written patient instructions provided.  Total time in face to face counseling 25 minutes.   Follow up Pharmacist Clinic Visit in 2 weeks.   Lorel Monaco, PharmD, [place] PGY2 Ambulatory Care Resident [place]

## 2021-01-08 ENCOUNTER — Other Ambulatory Visit: Payer: Self-pay

## 2021-01-08 ENCOUNTER — Ambulatory Visit: Payer: Managed Care, Other (non HMO) | Attending: Internal Medicine | Admitting: Pharmacist

## 2021-01-08 ENCOUNTER — Encounter: Payer: Self-pay | Admitting: Pharmacist

## 2021-01-08 ENCOUNTER — Other Ambulatory Visit: Payer: Self-pay | Admitting: Family Medicine

## 2021-01-08 ENCOUNTER — Ambulatory Visit: Payer: Self-pay | Admitting: Family Medicine

## 2021-01-08 DIAGNOSIS — E119 Type 2 diabetes mellitus without complications: Secondary | ICD-10-CM

## 2021-01-08 LAB — GLUCOSE, POCT (MANUAL RESULT ENTRY): POC Glucose: 235 mg/dl — AB (ref 70–99)

## 2021-01-08 MED ORDER — PEN NEEDLES 32G X 4 MM MISC: 1.0000 "application " | Freq: Every day | 3 refills | Status: DC

## 2021-01-08 MED ORDER — TOUJEO SOLOSTAR 300 UNIT/ML ~~LOC~~ SOPN: 10.0000 [IU] | PEN_INJECTOR | Freq: Every day | SUBCUTANEOUS | 99 refills | Status: DC

## 2021-01-08 MED ORDER — ONETOUCH ULTRA VI STRP
ORAL_STRIP | 3 refills | Status: DC
Start: 2021-01-08 — End: 2022-06-17

## 2021-01-08 NOTE — Telephone Encounter (Signed)
Requested medication (s) are due for refill today: due 01/29/21  Requested medication (s) are on the active medication list: yes  Last refill: 10/31/20 #12  0  refills  Future visit scheduled  2/23 with pharmacist.  Notes to clinic: Not delegated  Requested Prescriptions  Pending Prescriptions Disp Refills   Vitamin D, Ergocalciferol, (DRISDOL) 1.25 MG (50000 UNIT) CAPS capsule [Pharmacy Med Name: VITAMIN D2 1.25MG (50,000 UNIT)] 12 capsule 0    Sig: TAKE 1 CAPSULE BY MOUTH ONE TIME PER WEEK      Endocrinology:  Vitamins - Vitamin D Supplementation Failed - 01/08/2021  5:48 PM      Failed - 50,000 IU strengths are not delegated      Failed - Phosphate in normal range and within 360 days    No results found for: PHOS        Failed - Vitamin D in normal range and within 360 days    Vit D, 25-Hydroxy  Date Value Ref Range Status  10/30/2020 21.0 (L) 30.0 - 100.0 ng/mL Final    Comment:    Vitamin D deficiency has been defined by the Institute of Medicine and an Endocrine Society practice guideline as a level of serum 25-OH vitamin D less than 20 ng/mL (1,2). The Endocrine Society went on to further define vitamin D insufficiency as a level between 21 and 29 ng/mL (2). 1. IOM (Institute of Medicine). 2010. Dietary reference    intakes for calcium and D. [place]: The    [employer]. 2. Holick MF, Binkley [place], Bischoff-Ferrari HA, et al.    Evaluation, treatment, and prevention of vitamin D    deficiency: an Endocrine Society clinical practice    guideline. JCEM. 2011 Jul; 96(7):1911-30.           Passed - Ca in normal range and within 360 days    Calcium  Date Value Ref Range Status  04/19/2020 9.5 8.7 - 10.2 mg/dL Final          Passed - Valid encounter within last 12 months    Recent Outpatient Visits           Today Type 2 diabetes mellitus without complication, without long-term current use of insulin [facility])   [place], Annie Main L, RPH-CPP   1 month ago Type 2 diabetes mellitus without complication, without long-term current use of insulin [facility])   [place], RPH-CPP   8 years ago Leg edema, right   Primary Care at [facility], MD   8 years ago Cough   Primary Care at [facility], Ranell Patrick, MD       Future Appointments             In 2 weeks Daisy Blossom, Jarome Matin, [place]   In 3 weeks Nicolette Bang, DO Primary Care at [facility]

## 2021-01-21 NOTE — Progress Notes (Signed)
S:    PCP: Dr. Juleen China  Patient arrives in good spirits. Presents for diabetes evaluation, education, and management. Patient was referred and last seen by Primary Care Provider on 12/25/20. Last seen by pharmacy on 01/08/21. At that visit she endorsed continued poor glycemic control at home. Denied occurrence of GI symptoms related to Ozempic. Pt was started on Lantus 10 units daily and continued on Ozempic 0.5 mg weekly.  Today, patient reports medication adherence with Lantus 10 units daily and Ozempic 0.5 mg weekly. Reports mild acid reflux with Ozempic. Denies nausea/vomiting. Glucometer reports BG 150s-209. Denies BG <70. Discussed current diet and exercise regimen below.  Family/Social History: -Fhx: breast cancer in mother and aunt -Tobacco: denies  Insurance coverage/medication affordability:Cigna Managed and Medicaid [place] [employer]  Medication adherence reported good.   Current diabetes medications include:Lantus 10 units daily, Ozempic 0.5 mg weekly (Wednesdays) Current hypertension medications include:losartan 100 mg daily, amlodipine 10 mg daily Current hyperlipidemia medications include:rosuvastatin 10 mg daily  Patient denies hypoglycemic events.  Patient reported dietary habits: Eats 2-3 meals/day Breakfast: eggs, coffee Lunch: sandwich (bread made out of egg whites) Dinner: chicken, steak, salmon, low-starch vegetables Snacks: cheese stick Drinks: water and coffee, once in a while a diet soda  Patient-reported exercise habits: none    Patient denies nocturia (nighttime urination).  Patient denies neuropathy (nerve pain). Patient denies visual changes. Patient reports self foot exams.     O:  POCT: 184 (post-prandial)  Home blood sugars: 150s-209  Lab Results  Component Value Date   HGBA1C 11.5 (H) 10/30/2020   There were no vitals filed for this visit.  Lipid Panel     Component Value Date/Time   CHOL 297 (H) 10/30/2020 1003   TRIG 242  (H) 10/30/2020 1003   HDL 55 10/30/2020 1003   CHOLHDL 5.4 (H) 10/30/2020 1003   LDLCALC 195 (H) 10/30/2020 1003    Clinical Atherosclerotic Cardiovascular Disease (ASCVD): No  The 10-year ASCVD risk score Mikey Bussing DC Jr., et al., 2013) is: 7.8%   Values used to calculate the score:     Age: 55 years     Sex: Female     Is Non-Hispanic African American: No     Diabetic: Yes     Tobacco smoker: No     Systolic Blood Pressure: 427 mmHg     Is BP treated: Yes     HDL Cholesterol: 55 mg/dL     Total Cholesterol: 297 mg/dL    A/P: Diabetes longstanding currently uncontrolled. Medication adherence appears optimal. BG improved with recent initiation of Lantus 10 units daily. Patient is able to verbalize appropriate hypoglycemia management plan. Encouraged patient to aim for a diet full of vegetables, fruit and lean meats (chicken, [place], fish) and to limit carbs (bread, pasta, sugar, rice) and red meat consumption. Encouraged patient to exercise 20-30 minutes daily with the goal of 150 minutes per week. Patient verbalized understanding. -Increased Lantus to 15 units daily. Patient will continue to titrate 2 units every 2 days if fasting blood sugar >150 mg/dl until fasting blood sugars reach goal or next visit. (max 24 units daily) -Continued Ozempic 0.5 mg weekly -Extensively discussed pathophysiology of diabetes, recommended lifestyle interventions, dietary effects on blood sugar control -Counseled on s/sx of and management of hypoglycemia -Next A1C anticipated March 2021.   ASCVD risk - primary prevention in patient with diabetes. Last LDL is not controlled. ASCVD risk score is not >20%  - moderate/high intensity statin indicated. Will assess statin  adherence at next visit. -Continued rosuvastatin 10 mg daily.   Written patient instructions provided.  Total time in face to face counseling 20 minutes.   Follow up PCP Clinic Visit in 1 week.    Lorel Monaco, PharmD, [place] PGY2 Ambulatory Care  Resident [place]

## 2021-01-22 ENCOUNTER — Ambulatory Visit: Payer: Managed Care, Other (non HMO) | Attending: Internal Medicine | Admitting: Pharmacist

## 2021-01-22 ENCOUNTER — Other Ambulatory Visit: Payer: Self-pay

## 2021-01-22 ENCOUNTER — Encounter: Payer: Self-pay | Admitting: Pharmacist

## 2021-01-22 DIAGNOSIS — E119 Type 2 diabetes mellitus without complications: Secondary | ICD-10-CM | POA: Diagnosis not present

## 2021-01-22 LAB — GLUCOSE, POCT (MANUAL RESULT ENTRY): POC Glucose: 184 mg/dl — AB (ref 70–99)

## 2021-01-22 MED ORDER — TOUJEO SOLOSTAR 300 UNIT/ML ~~LOC~~ SOPN: PEN_INJECTOR | SUBCUTANEOUS | 99 refills | Status: DC

## 2021-01-23 ENCOUNTER — Telehealth: Payer: Self-pay | Admitting: Internal Medicine

## 2021-01-23 NOTE — Telephone Encounter (Signed)
Copied from [place] [phone]. Topic: General - Other >> Jan 22, 2021  2:38 PM Leward Quan A wrote: Reason for CRM: Patient called in to say that she need an appointment for three weeks on a Wednesday around 1 or 2 PM to see Vinson Moselle Ausdell. Asking for appointment to be scheduled and to call her and let her know when. Please call Ph# [phone]   Call placed to patient and LVM advising her that we had scheduled an appt for 02/19/21 at 2pm. Advised patient to call back with any questions or concerns.   If patient calls back please make sure she is aware of this appt and let her know she is free to reschedule if that date/time does not work for her.

## 2021-01-24 NOTE — Progress Notes (Signed)
Please call patient with update.   Schedule appointment for hemoglobin A1c/diabetes check-up with primary provider in March 2022.

## 2021-01-29 ENCOUNTER — Ambulatory Visit: Payer: Managed Care, Other (non HMO) | Admitting: Internal Medicine

## 2021-02-11 ENCOUNTER — Encounter: Payer: Self-pay | Admitting: Internal Medicine

## 2021-02-11 ENCOUNTER — Ambulatory Visit (INDEPENDENT_AMBULATORY_CARE_PROVIDER_SITE_OTHER): Payer: Managed Care, Other (non HMO) | Admitting: Internal Medicine

## 2021-02-11 ENCOUNTER — Other Ambulatory Visit: Payer: Self-pay

## 2021-02-11 VITALS — BP 118/80 | HR 78 | Temp 97.3°F | Resp 20 | Wt 192.0 lb

## 2021-02-11 DIAGNOSIS — Z794 Long term (current) use of insulin: Secondary | ICD-10-CM

## 2021-02-11 DIAGNOSIS — I10 Essential (primary) hypertension: Secondary | ICD-10-CM

## 2021-02-11 DIAGNOSIS — E119 Type 2 diabetes mellitus without complications: Secondary | ICD-10-CM

## 2021-02-11 DIAGNOSIS — E559 Vitamin D deficiency, unspecified: Secondary | ICD-10-CM | POA: Diagnosis not present

## 2021-02-11 LAB — POCT GLYCOSYLATED HEMOGLOBIN (HGB A1C): HbA1c, POC (controlled diabetic range): 9.4 % — AB (ref 0.0–7.0)

## 2021-02-11 LAB — POCT CBG (FASTING - GLUCOSE)-MANUAL ENTRY: Glucose Fasting, POC: 162 mg/dL — AB (ref 70–99)

## 2021-02-11 NOTE — Progress Notes (Signed)
Subjective:    Jessica Ponce - 55 y.o. female MRN [id number]  Date of birth: 15-Mar-1966  HPI  Jessica Ponce is here for follow up. Had coffee with a splash of heavy cream.    Diabetes mellitus, Type 2 Disease Monitoring             Blood Sugar Ranges: Fasting - 150-180 since starting insulin              Polyuria: no             Visual problems: no   Urine Microalbumin 8 (May 2021)--On Arb therapy   Last A1C: 11.5 (Dec 2021)   Medications: Insulin Glargine 20 units, Ozempic 0.5 mg weekly  Medication Compliance: yes  Medication Side Effects             Hypoglycemia: no   Chronic HTN Disease Monitoring:  Home BP Monitoring - Has a cuff but rarely monitors  Chest pain- no  Dyspnea- no Headache - no  Medications: Amlodipine 10 mg, Losartan 100 mg  Compliance- yes Lightheadedness- no  Edema- no    Health Maintenance:  Health Maintenance Due  Topic Date Due  . OPHTHALMOLOGY EXAM  Never done  . COVID-19 Vaccine (3 - Booster for Pfizer series) 10/13/2020    -  reports that she has never smoked. She has never used smokeless tobacco. - Review of Systems: Per HPI. - Past Medical History: Patient Active Problem List   Diagnosis Date Noted  . Family history of thyroid disorder 07/15/2020  . Mixed hyperlipidemia 04/23/2020  . Family history of breast cancer 03/11/2020  . Type 2 diabetes mellitus ([place]) 03/11/2020  . Hypertension, essential 07/31/2016   - Medications: reviewed and updated   Objective:   Physical Exam BP 118/80 (BP Location: Right Arm, Patient Position: Sitting, Cuff Size: Large)   Pulse 78   Temp (!) 97.3 F (36.3 C)   Resp 20   Wt 192 lb (87.1 kg)   LMP 12/10/2015 (LMP Unknown)   SpO2 96%   BMI 32.45 kg/m  Physical Exam Constitutional:      General: She is not in acute distress.    Appearance: She is not diaphoretic.  Cardiovascular:     Rate and Rhythm: Normal rate.  Pulmonary:     Effort: Pulmonary effort is normal. No  respiratory distress.  Musculoskeletal:        General: Normal range of motion.  Skin:    General: Skin is warm and dry.  Neurological:     Mental Status: She is alert and oriented to person, place, and time.  Psychiatric:        Mood and Affect: Affect normal.        Judgment: Judgment normal.            Assessment & Plan:  1. Type 2 diabetes mellitus without complication, with long-term current use of insulin (HCC) A1c has improved from 11.5>9.4. Insulin therapy was started <1 month ago with improvement in fasting CBGs. Essentially fasting CBG 164 at today's visit. Instructed to increase gradually to 24u daily if CBGs remain >150. Has upcoming f/u with Benard Halsted, PharmD. Will f/u here in 3 months or sooner as needed.  Counseled on Diabetic diet, my plate method, 563 minutes of moderate intensity exercise/week Blood sugar logs with fasting goals of 80-120 mg/dl, random of less than 180 and in the event of sugars less than 60 mg/dl or greater than 400 mg/dl encouraged to notify the clinic. Advised  on the need for annual eye exams, annual foot exams, Pneumonia vaccine. - Glucose (CBG), Fasting - HgB A1c  2. Hypertension, essential BP well controlled. Asymptomatic. Continue current regimen.  Counseled on blood pressure goal of less than 130/80, low-sodium, DASH diet, medication compliance, 150 minutes of moderate intensity exercise per week. Discussed medication compliance, adverse effects.  3. Vitamin D deficiency Last Vit D 21 in Dec 2021. Completed 12 weeks of 50,000 IU weekly. Repeat today for monitoring and appropriate therapy.  - VITAMIN D 25 Hydroxy (Vit-D Deficiency, Fractures)    Phill Myron, D.O. 02/11/2021, 10:31 AM Primary Care at [facility]

## 2021-02-11 NOTE — Progress Notes (Signed)
Request RF on Vitamin D F/u DM  CBG- 162  A1C- 9.4

## 2021-02-12 ENCOUNTER — Other Ambulatory Visit: Payer: Self-pay | Admitting: Internal Medicine

## 2021-02-12 LAB — VITAMIN D 25 HYDROXY (VIT D DEFICIENCY, FRACTURES): Vit D, 25-Hydroxy: 22.5 ng/mL — ABNORMAL LOW (ref 30.0–100.0)

## 2021-02-12 MED ORDER — VITAMIN D (ERGOCALCIFEROL) 1.25 MG (50000 UNIT) PO CAPS: 50000.0000 [IU] | ORAL_CAPSULE | ORAL | 0 refills | Status: DC

## 2021-02-18 NOTE — Progress Notes (Unsigned)
S:     PCP: Dr. Juleen China  Patient arrives in good spirits.  Presents for diabetes evaluation, education, and management. Patient was referred and last seen by Primary Care Provider on 02/11/21. At that visit, pt reported FBG 150-180 on Lantus 20 units daily and Ozempic 0.5 mg weekly. PCP instructed pt to increase Lantus gradually to 24 units daily if CBGs >150.   Today, patient reports doing well overall. She endorses medication compliance and denies any missed doses. She continues to take 20u of Lantus daily and Ozempic 0.5 mg weekly. Denies NV, abdominal pain. Denies polyuria, polydipsia, blurred vision, or neuropathy. Denies hypoglycemia. She checks her CBGs daily (see below). She is gradually increasing her exercise and denies any major changes in her diet since her last visit with [place].    Family/Social History: -Fhx: breast cancer in mother and aunt -Tobacco: denies  Insurance coverage/medication affordability:Cigna Managedand Medicaid [place] [employer]  Medication adherence reported good.   Current diabetes medications include:Lantus 24 units daily (continues to take 20u daily), Ozempic 0.5mg  weekly(Wednesdays) Current hypertension medications include:losartan 100 mg daily, amlodipine 10 mg daily Current hyperlipidemia medications include:rosuvastatin 10 mg daily  Patient denies hypoglycemic events.  Patient reported dietary habits: Eats 2-3 meals/day Breakfast: eggs, coffee Lunch: sandwich (bread made out of egg whites) Dinner: chicken, steak, salmon, low-starch vegetables Snacks: cheese stick Drinks: water and coffee, once in a while a diet soda  Patient-reported exercise habits: walks 30-40 minutes/1-2x weekly    Patient denies nocturia (nighttime urination).  Patient denies neuropathy (nerve pain). Patient denies visual changes. Patient reports self foot exams.     O:   POCT: 148  No log with her today.  Home fasting blood sugars: 150s  2 hour  post-meal/random blood sugars: denies readings >200.  Lab Results  Component Value Date   HGBA1C 9.4 (A) 02/11/2021   There were no vitals filed for this visit.  Lipid Panel     Component Value Date/Time   CHOL 297 (H) 10/30/2020 1003   TRIG 242 (H) 10/30/2020 1003   HDL 55 10/30/2020 1003   CHOLHDL 5.4 (H) 10/30/2020 1003   LDLCALC 195 (H) 10/30/2020 1003   Clinical Atherosclerotic Cardiovascular Disease (ASCVD): No  The 10-year ASCVD risk score Mikey Bussing DC Jr., et al., 2013) is: 6.6%   Values used to calculate the score:     Age: 43 years     Sex: Female     Is Non-Hispanic African American: No     Diabetic: Yes     Tobacco smoker: No     Systolic Blood Pressure: 017 mmHg     Is BP treated: Yes     HDL Cholesterol: 55 mg/dL     Total Cholesterol: 297 mg/dL    A/P: Diabetes longstanding currently uncontrolled, however, A1c from recent PCP visit reveals improvement. Home CBGs are still a little above goal. Patient is able to verbalize appropriate hypoglycemia management plan. Medication adherence appears appropriate. -Increased dose of Lantus to 25u daily.  -Continued 0.5 mg Ozempic weekly.  -Extensively discussed pathophysiology of diabetes, recommended lifestyle interventions, dietary effects on blood sugar control -Counseled on s/sx of and management of hypoglycemia -Next A1C anticipated 04/2021.   ASCVD risk - primary prevention in patient with diabetes. Last LDL is not controlled. LDL >190. High intensity statin indicated. Will plan on getting an updated lipid panel next month. Will likely titrate rosuvastatin to 20 mg to achieve high intensity statin therapy.  -Continued rosuvastatin 10 mg for now.  Written patient instructions provided.  Total time in face to face counseling 15 minutes.   Follow up Pharmacist Clinic Visit in 1 month.  Benard Halsted, PharmD, [place], [place] [phone]

## 2021-02-19 ENCOUNTER — Ambulatory Visit: Payer: Managed Care, Other (non HMO) | Attending: Family Medicine | Admitting: Pharmacist

## 2021-02-19 ENCOUNTER — Encounter: Payer: Self-pay | Admitting: Pharmacist

## 2021-02-19 ENCOUNTER — Other Ambulatory Visit: Payer: Self-pay

## 2021-02-19 DIAGNOSIS — E119 Type 2 diabetes mellitus without complications: Secondary | ICD-10-CM | POA: Diagnosis not present

## 2021-02-19 LAB — GLUCOSE, POCT (MANUAL RESULT ENTRY): POC Glucose: 148 mg/dl — AB (ref 70–99)

## 2021-02-19 MED ORDER — TOUJEO SOLOSTAR 300 UNIT/ML ~~LOC~~ SOPN: 25.0000 [IU] | PEN_INJECTOR | Freq: Every day | SUBCUTANEOUS | 99 refills | Status: DC

## 2021-02-26 LAB — HM DIABETES EYE EXAM

## 2021-03-01 ENCOUNTER — Other Ambulatory Visit: Payer: Self-pay | Admitting: Internal Medicine

## 2021-03-01 DIAGNOSIS — I10 Essential (primary) hypertension: Secondary | ICD-10-CM

## 2021-03-04 ENCOUNTER — Other Ambulatory Visit: Payer: Self-pay | Admitting: Internal Medicine

## 2021-03-04 DIAGNOSIS — F411 Generalized anxiety disorder: Secondary | ICD-10-CM

## 2021-03-25 NOTE — Progress Notes (Signed)
    S:     PCP: Dr. Juleen China  Patient arrives in good spirits.  Presents for diabetes evaluation, education, and management. Patient was referred and last seen by Primary Care Provider on 02/11/21. Pt last seen by pharmacy on 02/19/21. At that visit, home FBG 150s and pt denied PPBG readings >200. Lantus was increased from 20 units to 25 units daily.   Today, patient reports medication adherence with Lantus 25 units daily and Ozempic 0.5 mg weekly. Reports some GI distress but overall tolerable. Reports FBG 150-180 and PPBG <200. Denies BG <70. Additionally, reports self-discontinuing rosuvastatin 10 mg because it raised her sugars.   Family/Social History: -Fhx: breast cancer in mother and aunt -Tobacco: denies  Insurance coverage/medication affordability:Cigna Managedand Medicaid [place] [employer]  Medication adherence reportedgood. Current diabetes medications include:Lantus 25 units daily,Ozempic 0.5mg  weekly(Wednesdays) Current hypertension medications include:losartan 100 mg daily, amlodipine 10 mg daily Current hyperlipidemia medications include:rosuvastatin 10 mg daily (not taking)  Patient denies hypoglycemic events.  Patient reported dietary habits: Eats2-41meals/day Breakfast:eggs, coffee Lunch:sandwich (bread made out of egg whites) Dinner:chicken, steak, salmon, low-starch vegetables Snacks:cheese stick Drinks:water and coffee, once in a while a diet soda  Patient-reported exercise habits:walks 30-40 minutes/1-2x weekly  O:  POCT: 160 (fasting)  Home fasting blood sugars: 150-180 2 hour post-meal/random blood sugars: <200  Lab Results  Component Value Date   HGBA1C 9.4 (A) 02/11/2021   There were no vitals filed for this visit.  Lipid Panel     Component Value Date/Time   CHOL 297 (H) 10/30/2020 1003   TRIG 242 (H) 10/30/2020 1003   HDL 55 10/30/2020 1003   CHOLHDL 5.4 (H) 10/30/2020 1003   LDLCALC 195 (H) 10/30/2020 1003    Clinical Atherosclerotic Cardiovascular Disease (ASCVD): No  The 10-year ASCVD risk score Mikey Bussing DC Jr., et al., 2013) is: 6.6%   Values used to calculate the score:     Age: 55 years     Sex: Female     Is Non-Hispanic African American: No     Diabetic: Yes     Tobacco smoker: No     Systolic Blood Pressure: 001 mmHg     Is BP treated: Yes     HDL Cholesterol: 55 mg/dL     Total Cholesterol: 297 mg/dL    A/P: Diabetes longstanding currently ucontrolled. Medication adherence appears optimal. Given her reports of some GI distress, discussed insulin titration vs Ozempic titration. Patient agreed to increase Ozempic to 1 mg weekly. Provided patient with Ozempic saving card for copay assistance. Patient is able to verbalize appropriate hypoglycemia management plan. -Increased Ozempic to 1 mg weekly -Continued Lantus 25 units daily   -Extensively discussed pathophysiology of diabetes, recommended lifestyle interventions, dietary effects on blood sugar control -Counseled on s/sx of and management of hypoglycemia -Next A1C anticipated 04/2021.   ASCVD risk - primary prevention in patient with diabetes. Last LDL is not controlled. LDL >190. High intensity statin indicated. Pt reports self-discontinuing rosuvastatin because it raised her sugars.  -Will check lipid panel today and re-discuss lipid-lowering options at next visit.  Written patient instructions provided.  Total time in face to face counseling 20 minutes.   Follow up Pharmacist Clinic Visit in 4 weeks.     Lorel Monaco, PharmD, [place] PGY2 Ambulatory Care Resident [place]

## 2021-03-26 ENCOUNTER — Other Ambulatory Visit: Payer: Self-pay

## 2021-03-26 ENCOUNTER — Ambulatory Visit: Payer: Managed Care, Other (non HMO) | Attending: Internal Medicine | Admitting: Pharmacist

## 2021-03-26 DIAGNOSIS — E119 Type 2 diabetes mellitus without complications: Secondary | ICD-10-CM | POA: Diagnosis not present

## 2021-03-26 DIAGNOSIS — E782 Mixed hyperlipidemia: Secondary | ICD-10-CM

## 2021-03-26 LAB — GLUCOSE, POCT (MANUAL RESULT ENTRY): POC Glucose: 160 mg/dl — AB (ref 70–99)

## 2021-03-26 MED ORDER — OZEMPIC (1 MG/DOSE) 4 MG/3ML ~~LOC~~ SOPN: 1.0000 mg | PEN_INJECTOR | SUBCUTANEOUS | 3 refills | Status: DC

## 2021-03-27 LAB — LIPID PANEL
Chol/HDL Ratio: 4.5 ratio — ABNORMAL HIGH (ref 0.0–4.4)
Cholesterol, Total: 244 mg/dL — ABNORMAL HIGH (ref 100–199)
HDL: 54 mg/dL (ref >39)
LDL Chol Calc (NIH): 152 mg/dL — ABNORMAL HIGH (ref 0–99)
Triglycerides: 210 mg/dL — ABNORMAL HIGH (ref 0–149)
VLDL Cholesterol Cal: 38 mg/dL (ref 5–40)

## 2021-04-23 ENCOUNTER — Ambulatory Visit: Payer: Managed Care, Other (non HMO) | Admitting: Pharmacist

## 2021-05-21 ENCOUNTER — Ambulatory Visit: Payer: Managed Care, Other (non HMO) | Admitting: Family

## 2021-05-21 ENCOUNTER — Ambulatory Visit: Payer: Managed Care, Other (non HMO) | Admitting: Internal Medicine

## 2021-06-09 ENCOUNTER — Other Ambulatory Visit: Payer: Self-pay

## 2021-06-09 ENCOUNTER — Telehealth: Payer: Self-pay | Admitting: Internal Medicine

## 2021-06-09 DIAGNOSIS — I10 Essential (primary) hypertension: Secondary | ICD-10-CM

## 2021-06-09 MED ORDER — LOSARTAN POTASSIUM 100 MG PO TABS: 100.0000 mg | ORAL_TABLET | Freq: Every day | ORAL | 0 refills | Status: DC

## 2021-06-09 MED ORDER — AMLODIPINE BESYLATE 10 MG PO TABS: 10.0000 mg | ORAL_TABLET | Freq: Every day | ORAL | 0 refills | Status: DC

## 2021-06-09 NOTE — Telephone Encounter (Signed)
Patient needing refills on her medications  losartan (COZAAR) 100 MG tablet  amLODipine (NORVASC) 10 MG tablet   Pharmacy  [id number] [address], [address]  [place], [place] [address]  Phone:  [phone]  Fax:  [phone]  DEA #:  [id number]

## 2021-06-17 NOTE — Progress Notes (Deleted)
Patient ID: Jessica Ponce, female    DOB: [birth year]  MRN: [id number]  CC: Hypertension and Diabetes Follow-Up   Subjective: Jessica Ponce is a 55 y.o. female who presents for hypertension and diabetes follow-up.   Her concerns today include:   HYPERTENSION FOLLOW-UP: 02/11/2021 per DO note: BP well controlled. Asymptomatic. Continue current regimen.   06/18/2021: Amlodipine  Losartan  Currently taking: see medication list Have you taken your blood pressure medication today: _0  Yes _1  No  Med Adherence: _2  Yes    _3  No Medication side effects: _4  Yes    _5  No Adherence with salt restriction (low-salt diet): _6  Yes    _7  No Exercise: Yes _8  No _9  Home Monitoring?: _10  Yes    _11  No Monitoring Frequency: _12  Yes    _13  No Home BP results range: _14  Yes    _15  No Smoking _16  Yes _17  No SOB? _18  Yes    _19  No Chest Pain?: _20  Yes    _21  No Leg swelling?: _22  Yes    _23  No Headaches?: _24  Yes    _25  No Dizziness? _26  Yes    _27  No Comments:   2. DIABETES TYPE 2 FOLLOW-UP: 02/11/2021 per DO note: A1c has improved from 11.5>9.4. Insulin therapy was started <1 month ago with improvement in fasting CBGs. Essentially fasting CBG 164 at today's visit. Instructed to increase gradually to 24u daily if CBGs remain >150. Has upcoming f/u with Benard Halsted, PharmD. Will f/u here in 3 months or sooner as needed.   Visit 03/26/2021 at [facility] per PharmD note: Diabetes longstanding currently ucontrolled. Medication adherence appears optimal. Given her reports of some GI distress, discussed insulin titration vs Ozempic titration. Patient agreed to increase Ozempic to 1 mg weekly. Provided patient with Ozempic saving card for copay assistance. Patient is able to verbalize appropriate hypoglycemia management plan. -Increased Ozempic to 1 mg weekly -Continued Lantus 25 units daily   -Extensively discussed pathophysiology of diabetes, recommended lifestyle interventions,  dietary effects on blood sugar control -Counseled on s/sx of and management of hypoglycemia -Next A1C anticipated 04/2021.    ASCVD risk - primary prevention in patient with diabetes. Last LDL is not controlled. LDL >190. High intensity statin indicated. Pt reports self-discontinuing rosuvastatin because it raised her sugars. -Will check lipid panel today and re-discuss lipid-lowering options at next visit.  06/18/2021: Ozempic 1 mg weekly  Lantus 25 units daily  Allergic to Metformin  Last A1C:   Are you fasting today: _28  Yes _29  No  Have you taken your anti-diabetic medications today: _30  Yes _31  No  Med Adherence:  _32  Yes    _33  No Medication side effects:  _34  Yes    _35  No Home Monitoring?  _36  Yes    _37  No Home glucose results range:*** Diet Adherence: _38  Yes    _39  No Exercise: _40  Yes    _41  No Hypoglycemic episodes?: _42  Yes    _43  No Numbness of the feet? _44  Yes    _45  No Retinopathy hx? _46  Yes    _47  No Last eye exam:  Comments: ***  Patient Active Problem List   Diagnosis Date Noted   Family history of thyroid disorder 07/15/2020   Mixed hyperlipidemia 04/23/2020   Family history of breast cancer 03/11/2020   Type 2 diabetes mellitus ([place]) 03/11/2020   Hypertension, essential 07/31/2016     Current Outpatient Medications on File Prior to Visit  Medication Sig Dispense Refill  Vitamin D, Ergocalciferol, (DRISDOL) 1.25 MG (50000 UNIT) CAPS capsule Take 1 capsule (50,000 Units total) by mouth every 7 (seven) days. 12 capsule 0   amLODipine (NORVASC) 10 MG tablet Take 1 tablet (10 mg total) by mouth daily. 30 tablet 0   Blood Glucose Monitoring Suppl (ONE TOUCH ULTRA MINI) w/Device KIT Used to check fasting FSBS daily. Dx: E11.9 1 kit 0   glucose blood (ONETOUCH ULTRA) test strip Check sugars twice daily. Dx: E11.9 100 each 3   insulin glargine, 1 Unit Dial, (TOUJEO SOLOSTAR) 300 UNIT/ML Solostar Pen Inject 25 Units into the skin daily. Inject 15 units daily, may increase by 2 units  every 2 days if FBG >150. (max 24 units daily) 4.5 mL PRN   Insulin Pen Needle (PEN NEEDLES) 32G X 4 MM MISC 1 application by Does not apply route daily. 100 each 3   Lancets (ONETOUCH ULTRASOFT) lancets Use to check fasting FSBS daily. Dx: E11.9 100 each 12   losartan (COZAAR) 100 MG tablet Take 1 tablet (100 mg total) by mouth daily. 30 tablet 0   rosuvastatin (CRESTOR) 10 MG tablet Take 1 tablet (10 mg total) by mouth daily. 30 tablet 3   Semaglutide, 1 MG/DOSE, (OZEMPIC, 1 MG/DOSE,) 4 MG/3ML SOPN Inject 1 mg into the skin once a week. 9 mL 3   sertraline (ZOLOFT) 50 MG tablet TAKE 1 TABLET BY MOUTH EVERY DAY 30 tablet 2   No current facility-administered medications on file prior to visit.    Allergies  Allergen Reactions   Valsartan Diarrhea   Atorvastatin Other (See Comments)    Elevated sugars   Codeine Nausea Only    vomiting vomitting   Metformin And Related Diarrhea    Social History   Socioeconomic History   Marital status: Married    Spouse name: Legrand Como   Number of children: 2   Years of education: 16   Highest education level: Not on file  Occupational History   Not on file  Tobacco Use   Smoking status: Never   Smokeless tobacco: Never  Vaping Use   Vaping Use: Never used  Substance and Sexual Activity   Alcohol use: Yes    Alcohol/week: 0.0 standard drinks    Comment: occasional wine   Drug use: No   Sexual activity: Yes    Birth control/protection: Post-menopausal  Other Topics Concern   Not on file  Social History Narrative   Lives with husband and 2 kids   Caffeine use: Drinks 2-3 cups coffee per day   Social Determinants of Health   Financial Resource Strain: Not on file  Food Insecurity: Not on file  Transportation Needs: Not on file  Physical Activity: Not on file  Stress: Not on file  Social Connections: Not on file  Intimate Partner Violence: Not on file    Family History  Problem Relation Age of Onset   Memory loss Mother     Breast cancer Mother 66   Breast cancer Maternal Aunt 74   Colon cancer Neg Hx    Rectal cancer Neg Hx    Stomach cancer Neg Hx    Esophageal cancer Neg Hx     Past Surgical History:  Procedure Laterality Date   APPENDECTOMY  1985   exploratory laparoscopy     WISDOM TOOTH EXTRACTION      ROS: Review of Systems Negative except as stated above  PHYSICAL EXAM: LMP 12/10/2015 (LMP Unknown)   Physical Exam  {female adult master:[id number]} {female adult  master:[id number]}  CMP Latest Ref Rng & Units 04/19/2020 12/12/2015 08/10/2014  Glucose 65 - 99 mg/dL 242(H) 121(H) 150(H)  BUN 6 - 24 mg/dL _0 Creatinine 0.57 - 1.00 mg/dL 0.80 0.93 0.68  Sodium 134 - 144 mmol/L 139 139 140  Potassium 3.5 - 5.2 mmol/L 4.5 4.0 4.1  Chloride 96 - 106 mmol/L 100 103 104  CO2 20 - 29 mmol/L _1 Calcium 8.7 - 10.2 mg/dL 9.5 9.7 8.9  Total Protein 6.0 - 8.5 g/dL 7.4 - -  Total Bilirubin 0.0 - 1.2 mg/dL 0.4 - -  Alkaline Phos 48 - 121 IU/L 84 - -  AST 0 - 40 IU/L 11 - -  ALT 0 - 32 IU/L 14 - -   Lipid Panel     Component Value Date/Time   CHOL 244 (H) 03/26/2021 1116   TRIG 210 (H) 03/26/2021 1116   HDL 54 03/26/2021 1116   CHOLHDL 4.5 (H) 03/26/2021 1116   LDLCALC 152 (H) 03/26/2021 1116    CBC    Component Value Date/Time   WBC 7.7 04/19/2020 1025   WBC 10.6 (H) 12/12/2015 1500   RBC 5.03 04/19/2020 1025   RBC 4.89 12/12/2015 1500   HGB 14.9 04/19/2020 1025   HCT 43.5 04/19/2020 1025   PLT 309 04/19/2020 1025   MCV 87 04/19/2020 1025   MCH 29.6 04/19/2020 1025   MCH 29.7 12/12/2015 1500   MCHC 34.3 04/19/2020 1025   MCHC 33.5 12/12/2015 1500   RDW 12.8 04/19/2020 1025   LYMPHSABS 2.9 04/19/2020 1025   EOSABS 0.3 04/19/2020 1025   BASOSABS 0.0 04/19/2020 1025    ASSESSMENT AND PLAN:  There are no diagnoses linked to this encounter.   Patient was given the opportunity to ask questions.  Patient verbalized understanding of the plan and was able to repeat key elements  of the plan. Patient was given clear instructions to go to Emergency Department or return to medical center if symptoms don't improve, worsen, or new problems develop.The patient verbalized understanding.   No orders of the defined types were placed in this encounter.    Requested Prescriptions    No prescriptions requested or ordered in this encounter    No follow-ups on file.  Camillia Herter, NP

## 2021-06-18 ENCOUNTER — Ambulatory Visit: Payer: Managed Care, Other (non HMO) | Admitting: Family

## 2021-06-26 ENCOUNTER — Encounter: Payer: Self-pay | Admitting: Family

## 2021-06-26 ENCOUNTER — Ambulatory Visit (INDEPENDENT_AMBULATORY_CARE_PROVIDER_SITE_OTHER): Payer: Managed Care, Other (non HMO) | Admitting: Family

## 2021-06-26 ENCOUNTER — Other Ambulatory Visit: Payer: Self-pay

## 2021-06-26 VITALS — BP 124/85 | HR 82 | Temp 98.1°F | Resp 15 | Ht 65.0 in | Wt 191.0 lb

## 2021-06-26 DIAGNOSIS — Z794 Long term (current) use of insulin: Secondary | ICD-10-CM

## 2021-06-26 DIAGNOSIS — I10 Essential (primary) hypertension: Secondary | ICD-10-CM | POA: Diagnosis not present

## 2021-06-26 DIAGNOSIS — E119 Type 2 diabetes mellitus without complications: Secondary | ICD-10-CM

## 2021-06-26 DIAGNOSIS — E559 Vitamin D deficiency, unspecified: Secondary | ICD-10-CM | POA: Diagnosis not present

## 2021-06-26 LAB — POCT GLYCOSYLATED HEMOGLOBIN (HGB A1C): Hemoglobin A1C: 8.3 % — AB (ref 4.0–5.6)

## 2021-06-26 LAB — GLUCOSE, POCT (MANUAL RESULT ENTRY): POC Glucose: 175 mg/dl — AB (ref 70–99)

## 2021-06-26 MED ORDER — TOUJEO SOLOSTAR 300 UNIT/ML ~~LOC~~ SOPN: 26.0000 [IU] | PEN_INJECTOR | Freq: Every day | SUBCUTANEOUS | 99 refills | Status: DC

## 2021-06-26 NOTE — Progress Notes (Signed)
Pt presents for diabetes follow up, has been under a lot of job related stress lately

## 2021-06-26 NOTE — Progress Notes (Signed)
Jessica Ponce, is a 55 y.o. female  KGS:[id number]  VOP:[id number]  DOB - 07/31/1966  Subjective:  Chief Complaint and HPI: Jessica Ponce is a 55 y.o. female who presents to the clinic this morning for diabetes management.  Last seen at the clinic in March of this year, been monitoring her blood sugar as needed as well as every morning her blood sugars in the morning range from 140s to 190s.  Eye exam in April of this year.  Denies polyuria, polyphagia, polydipsia.  Reports no chest pain, shortness of breath, or fever.  Her A1c is 8.3% down from 9.4% back in March.  Patient says she did not do well on metformin.  ED/Hospital notes reviewed.   Social History: Family history:  ROS:   Constitutional:  No f/c, No night sweats, No unexplained weight loss. EENT:  No vision changes, No blurry vision, No hearing changes. No mouth, throat, or ear problems.  Respiratory: No cough, No SOB Cardiac: No CP, no palpitations GI:  No abd pain, No N/V/D. Musculoskeletal: No joint pain Neuro: No headache, no dizziness, no motor weakness.  Endocrine:  No polydipsia. No polyuria.  Psych: Denies SI/HI  No problems updated.  ALLERGIES: Allergies  Allergen Reactions   Valsartan Diarrhea   Atorvastatin Other (See Comments)    Elevated sugars   Codeine Nausea Only    vomiting vomitting   Metformin And Related Diarrhea    PAST MEDICAL HISTORY: Past Medical History:  Diagnosis Date   Allergy    Asthma    no current problems, does not use inhaler   Diabetes mellitus without complication ([place])    type 2   Essential hypertension    Hyperlipidemia     MEDICATIONS AT HOME: Prior to Admission medications   Medication Sig Start Date End Date Taking? Authorizing Provider  Vitamin D, Ergocalciferol, (DRISDOL) 1.25 MG (50000 UNIT) CAPS capsule Take 1 capsule (50,000 Units total) by mouth every 7 (seven) days. 02/12/21   Nicolette Bang, MD  amLODipine (NORVASC) 10 MG tablet Take 1 tablet (10  mg total) by mouth daily. 06/09/21   Camillia Herter, NP  Blood Glucose Monitoring Suppl (ONE TOUCH ULTRA MINI) w/Device KIT Used to check fasting FSBS daily. Dx: E11.9 07/15/20   Mayers, Cari S, PA-C  glucose blood (ONETOUCH ULTRA) test strip Check sugars twice daily. Dx: E11.9 01/08/21   Nicolette Bang, MD  insulin glargine, 1 Unit Dial, (TOUJEO SOLOSTAR) 300 UNIT/ML Solostar Pen Inject 26 Units into the skin daily. Inject 15 units daily, may increase by 2 units every 2 days if FBG >150. (max 24 units daily) 06/26/21   Feliberto Gottron, FNP  Insulin Pen Needle (PEN NEEDLES) 32G X 4 MM MISC 1 application by Does not apply route daily. 01/08/21   Nicolette Bang, MD  Lancets [facility] ULTRASOFT) lancets Use to check fasting FSBS daily. Dx: E11.9 07/15/20   Mayers, Cari S, PA-C  losartan (COZAAR) 100 MG tablet Take 1 tablet (100 mg total) by mouth daily. 06/09/21   Camillia Herter, NP  rosuvastatin (CRESTOR) 10 MG tablet Take 1 tablet (10 mg total) by mouth daily. 10/31/20   Charlott Rakes, MD  Semaglutide, 1 MG/DOSE, (OZEMPIC, 1 MG/DOSE,) 4 MG/3ML SOPN Inject 1 mg into the skin once a week. 03/26/21   Nicolette Bang, MD  sertraline (ZOLOFT) 50 MG tablet TAKE 1 TABLET BY MOUTH EVERY DAY 03/04/21   Nicolette Bang, MD     Objective:  Jessica Ponce:  Vitals:   06/26/21 0832  BP: 124/85  Pulse: 82  Resp: 15  Temp: 98.1 F (36.7 C)  SpO2: 97%  Weight: 191 lb (86.6 kg)  Height: _0  (1.651 m)    General appearance : A&OX3. NAD. Non-toxic-appearing HEENT: Atraumatic and Normocephalic.  PERRLA. EOM intact.  TM clear B. Mouth-MMM, post pharynx WNL w/o erythema, No PND. Neck: supple, no JVD. No cervical lymphadenopathy. No thyromegaly Chest/Lungs:  Breathing-non-labored, Good air entry bilaterally, breath sounds normal without rales, rhonchi, or wheezing  CVS: S1 S2 regular, no murmurs, gallops, rubs  Abdomen: Bowel sounds present, Non tender and not distended with no  gaurding, rigidity or rebound. Neurology:  CN II-XII grossly intact, Non focal.   Psych:  TP linear. J/I WNL. Normal speech. Appropriate eye contact and affect.  Data Review Lab Results  Component Value Date   HGBA1C 8.3 (A) 06/26/2021   HGBA1C 9.4 (A) 02/11/2021   HGBA1C 11.5 (H) 10/30/2020     Assessment & Plan   1. Type 2 diabetes mellitus without complication, with long-term current use of insulin (HCC) -Still Lantus 26 units subcu daily -Increase activities like exercising at least 30 minutes/day for 5 days/week at least. -Eat healthy diet, avoid greasy foods. -Monitor blood sugar at least twice a day, report blood sugar less than 80 mg/dL or greater than 249 mg/dL to the clinic or local emergency room. -Monofilament test during next visit - POCT glucose (manual entry) - POCT glycosylated hemoglobin (Hb A1C) - CMP14+EGFR - CBC with Differential - Lipid Panel -Call or come back with new symptoms.  Verbalized understanding 2. Essential hypertension Take medications as prescribed  3. Vitamin D deficiency -Take medications as prescribed - Vitamin D, 25-hydroxy   Patient have been counseled extensively about nutrition and exercise  No follow-ups on file.  The patient was given clear instructions to go to ER or return to medical center if symptoms don't improve, worsen or new problems develop. The patient verbalized understanding. The patient was told to call to get lab results if they haven't heard anything in the next week.     Feliberto Gottron, APRN, FNP-C [facility] and [facility] [place], [place]   06/26/2021, 9:15 AM

## 2021-06-26 NOTE — Patient Instructions (Signed)
Type 2 diabetes mellitus without complication, with long-term current use of insulin (HCC) -Still Lantus 26 units subcu daily -Increase activities like exercising at least 30 minutes/day for 5 days/week at least. -Eat healthy diet, avoid greasy foods. -Monitor blood sugar at least twice a day, report blood sugar less than 80 mg/dL or greater than 249 mg/dL to the clinic or local emergency room. -Monofilament test during next visit - POCT glucose (manual entry) - POCT glycosylated hemoglobin (Hb A1C) - CMP14+EGFR - CBC with Differential - Lipid Panel -Call or come back with new symptoms.  Verbalized understanding 2. Essential hypertension Take medications as prescribed  3. Vitamin D deficiency -Take medications as prescribed - Vitamin D, 25-hydroxy

## 2021-06-27 LAB — CMP14+EGFR
ALT: 14 IU/L (ref 0–32)
AST: 13 IU/L (ref 0–40)
Albumin/Globulin Ratio: 1.6 (ref 1.2–2.2)
Albumin: 4.5 g/dL (ref 3.8–4.9)
Alkaline Phosphatase: 97 IU/L (ref 44–121)
BUN/Creatinine Ratio: 14 (ref 9–23)
BUN: 11 mg/dL (ref 6–24)
Bilirubin Total: 0.3 mg/dL (ref 0.0–1.2)
CO2: 22 mmol/L (ref 20–29)
Calcium: 9.4 mg/dL (ref 8.7–10.2)
Chloride: 100 mmol/L (ref 96–106)
Creatinine, Ser: 0.81 mg/dL (ref 0.57–1.00)
Globulin, Total: 2.8 g/dL (ref 1.5–4.5)
Glucose: 155 mg/dL — ABNORMAL HIGH (ref 65–99)
Potassium: 4.3 mmol/L (ref 3.5–5.2)
Sodium: 140 mmol/L (ref 134–144)
Total Protein: 7.3 g/dL (ref 6.0–8.5)
eGFR: 86 mL/min/{1.73_m2} (ref >59)

## 2021-06-27 LAB — LIPID PANEL
Chol/HDL Ratio: 4.8 ratio — ABNORMAL HIGH (ref 0.0–4.4)
Cholesterol, Total: 278 mg/dL — ABNORMAL HIGH (ref 100–199)
HDL: 58 mg/dL (ref >39)
LDL Chol Calc (NIH): 186 mg/dL — ABNORMAL HIGH (ref 0–99)
Triglycerides: 183 mg/dL — ABNORMAL HIGH (ref 0–149)
VLDL Cholesterol Cal: 34 mg/dL (ref 5–40)

## 2021-06-27 LAB — CBC WITH DIFFERENTIAL/PLATELET
Basophils Absolute: 0.1 10*3/uL (ref 0.0–0.2)
Basos: 1 %
EOS (ABSOLUTE): 0.3 10*3/uL (ref 0.0–0.4)
Eos: 3 %
Hematocrit: 40.8 % (ref 34.0–46.6)
Hemoglobin: 13.6 g/dL (ref 11.1–15.9)
Immature Grans (Abs): 0 10*3/uL (ref 0.0–0.1)
Immature Granulocytes: 0 %
Lymphocytes Absolute: 3.3 10*3/uL — ABNORMAL HIGH (ref 0.7–3.1)
Lymphs: 34 %
MCH: 29.2 pg (ref 26.6–33.0)
MCHC: 33.3 g/dL (ref 31.5–35.7)
MCV: 88 fL (ref 79–97)
Monocytes Absolute: 0.4 10*3/uL (ref 0.1–0.9)
Monocytes: 4 %
Neutrophils Absolute: 5.7 10*3/uL (ref 1.4–7.0)
Neutrophils: 58 %
Platelets: 328 10*3/uL (ref 150–450)
RBC: 4.65 x10E6/uL (ref 3.77–5.28)
RDW: 13 % (ref 11.7–15.4)
WBC: 9.9 10*3/uL (ref 3.4–10.8)

## 2021-07-03 ENCOUNTER — Other Ambulatory Visit: Payer: Self-pay | Admitting: Family

## 2021-07-03 DIAGNOSIS — E785 Hyperlipidemia, unspecified: Secondary | ICD-10-CM

## 2021-07-03 MED ORDER — ROSUVASTATIN CALCIUM 10 MG PO TABS: 20.0000 mg | ORAL_TABLET | Freq: Every day | ORAL | 3 refills | Status: DC

## 2021-07-12 ENCOUNTER — Other Ambulatory Visit: Payer: Self-pay | Admitting: Family

## 2021-07-12 DIAGNOSIS — I10 Essential (primary) hypertension: Secondary | ICD-10-CM

## 2021-08-05 ENCOUNTER — Encounter: Payer: Self-pay | Admitting: Nurse Practitioner

## 2021-08-05 ENCOUNTER — Other Ambulatory Visit: Payer: Self-pay

## 2021-08-05 ENCOUNTER — Ambulatory Visit (INDEPENDENT_AMBULATORY_CARE_PROVIDER_SITE_OTHER): Payer: Managed Care, Other (non HMO) | Admitting: Nurse Practitioner

## 2021-08-05 DIAGNOSIS — F411 Generalized anxiety disorder: Secondary | ICD-10-CM | POA: Diagnosis not present

## 2021-08-05 MED ORDER — SERTRALINE HCL 50 MG PO TABS: 50.0000 mg | ORAL_TABLET | Freq: Every day | ORAL | 2 refills | Status: DC

## 2021-08-05 NOTE — Progress Notes (Signed)
Virtual Visit via Telephone Note  I connected with Jessica Ponce on 08/05/21 at  9:30 AM EDT by telephone and verified that I am speaking with the correct person using two identifiers.  Location: Patient: Home Provider: Office   I discussed the limitations, risks, security and privacy concerns of performing an evaluation and management service by telephone and the availability of in person appointments. I also discussed with the patient that there may be a patient responsible charge related to this service. The patient expressed understanding and agreed to proceed.   History of Present Illness:  Patient presents today for medication refill through televisit.  This was a former patient of Dr. Juleen China.  She does have an appointment scheduled to establish care with Dr. Redmond Pulling in November.  Patient needs a refill today on her Zoloft.  She states that this medication has been working well for her at the current dosage.  She was started on this medication during the pandemic due to ongoing stress and depression.  Denies any adverse reactions.  Overall happy with the medication. Denies f/c/s, n/v/d, hemoptysis, PND, chest pain or edema.     Observations/Objective:  Vitals with BMI 06/26/2021 02/11/2021 12/25/2020  Height '5\' 5"'$  - 5' 4.5"  Weight 191 lbs 192 lbs 188 lbs  BMI 123456 - 123456  Systolic [id number] -  Diastolic 85 80 -  Pulse 82 78 -      Assessment and Plan:  Anxiety and Depression:  Continue Zoloft at current dosage - will send refill  Please keep upcoming appointment to establish care with Dr. Redmond Pulling  Follow up:  Follow up as scheduled  Patient Instructions  Anxiety and Depression:  Continue Zoloft at current dosage - will send refill  Please keep upcoming appointment to establish care with Dr. Redmond Pulling  Follow up:  Follow up as scheduled  Generalized Anxiety Disorder, Adult Generalized anxiety disorder (GAD) is a mental health condition. Unlike normal worries,  anxiety related to GAD is not triggered by a specific event. These worries do not fade or get better with time. GAD interferes with relationships, work, and school. GAD symptoms can vary from mild to severe. People with severe GAD can have intense waves of anxiety with physical symptoms that are similar to panic attacks. What are the causes? The exact cause of GAD is not known, but the following are believed to have an impact: Differences in natural brain chemicals. Genes passed down from parents to children. Differences in the way threats are perceived. Development during childhood. Personality. What increases the risk? The following factors may make you more likely to develop this condition: Being female. Having a family history of anxiety disorders. Being very shy. Experiencing very stressful life events, such as the death of a loved one. Having a very stressful family environment. What are the signs or symptoms? People with GAD often worry excessively about many things in their lives, such as their health and family. Symptoms may also include: Mental and emotional symptoms: Worrying excessively about natural disasters. Fear of being late. Difficulty concentrating. Fears that others are judging your performance. Physical symptoms: Fatigue. Headaches, muscle tension, muscle twitches, trembling, or feeling shaky. Feeling like your heart is pounding or beating very fast. Feeling out of breath or like you cannot take a deep breath. Having trouble falling asleep or staying asleep, or experiencing restlessness. Sweating. Nausea, diarrhea, or irritable bowel syndrome (IBS). Behavioral symptoms: Experiencing erratic moods or irritability. Avoidance of new situations. Avoidance of people. Extreme difficulty making decisions.  How is this diagnosed? This condition is diagnosed based on your symptoms and medical history. You will also have a physical exam. Your health care provider may  perform tests to rule out other possible causes of your symptoms. To be diagnosed with GAD, a person must have anxiety that: Is out of his or her control. Affects several different aspects of his or her life, such as work and relationships. Causes distress that makes him or her unable to take part in normal activities. Includes at least three symptoms of GAD, such as restlessness, fatigue, trouble concentrating, irritability, muscle tension, or sleep problems. Before your health care provider can confirm a diagnosis of GAD, these symptoms must be present more days than they are not, and they must last for 6 months or longer. How is this treated? This condition may be treated with: Medicine. Antidepressant medicine is usually prescribed for long-term daily control. Anti-anxiety medicines may be added in severe cases, especially when panic attacks occur. Talk therapy (psychotherapy). Certain types of talk therapy can be helpful in treating GAD by providing support, education, and guidance. Options include: Cognitive behavioral therapy (CBT). People learn coping skills and self-calming techniques to ease their physical symptoms. They learn to identify unrealistic thoughts and behaviors and to replace them with more appropriate thoughts and behaviors. Acceptance and commitment therapy (ACT). This treatment teaches people how to be mindful as a way to cope with unwanted thoughts and feelings. Biofeedback. This process trains you to manage your body's response (physiological response) through breathing techniques and relaxation methods. You will work with a therapist while machines are used to monitor your physical symptoms. Stress management techniques. These include yoga, meditation, and exercise. A mental health specialist can help determine which treatment is best for you. Some people see improvement with one type of therapy. However, other people require a combination of therapies. Follow these  instructions at home: Lifestyle Maintain a consistent routine and schedule. Anticipate stressful situations. Create a plan, and allow extra time to work with your plan. Practice stress management or self-calming techniques that you have learned from your therapist or your health care provider. General instructions Take over-the-counter and prescription medicines only as told by your health care provider. Understand that you are likely to have setbacks. Accept this and be kind to yourself as you persist to take better care of yourself. Recognize and accept your accomplishments, even if you judge them as small. Keep all follow-up visits as told by your health care provider. This is important. Contact a health care provider if: Your symptoms do not get better. Your symptoms get worse. You have signs of depression, such as: A persistently sad or irritable mood. Loss of enjoyment in activities that used to bring you joy. Change in weight or eating. Changes in sleeping habits. Avoiding friends or family members. Loss of energy for normal tasks. Feelings of guilt or worthlessness. Get help right away if: You have serious thoughts about hurting yourself or others. If you ever feel like you may hurt yourself or others, or have thoughts about taking your own life, get help right away. Go to your nearest emergency department or: Call your local emergency services (911 in the [place] Call a suicide crisis helpline, such as the [place] at 906-565-4079. This is open 24 hours a day in the [place] Text the Crisis Text Line at 403-431-1330 (in the [place].). Summary Generalized anxiety disorder (GAD) is a mental health condition that involves worry that is not triggered by  a specific event. People with GAD often worry excessively about many things in their lives, such as their health and family. GAD may cause symptoms such as restlessness, trouble concentrating, sleep problems, frequent  sweating, nausea, diarrhea, headaches, and trembling or muscle twitching. A mental health specialist can help determine which treatment is best for you. Some people see improvement with one type of therapy. However, other people require a combination of therapies. This information is not intended to replace advice given to you by your health care provider. Make sure you discuss any questions you have with your health care provider. Document Revised: 09/06/2019 Document Reviewed: 09/06/2019 Elsevier Patient Education  2022 [employer].     I discussed the assessment and treatment plan with the patient. The patient was provided an opportunity to ask questions and all were answered. The patient agreed with the plan and demonstrated an understanding of the instructions.   The patient was advised to call back or seek an in-person evaluation if the symptoms worsen or if the condition fails to improve as anticipated.  I provided 23 minutes of non-face-to-face time during this encounter.   Fenton Foy, NP

## 2021-08-05 NOTE — Patient Instructions (Signed)
Anxiety and Depression:  Continue Zoloft at current dosage - will send refill  Please keep upcoming appointment to establish care with Dr. Redmond Pulling  Follow up:  Follow up as scheduled  Generalized Anxiety Disorder, Adult Generalized anxiety disorder (GAD) is a mental health condition. Unlike normal worries, anxiety related to GAD is not triggered by a specific event. These worries do not fade or get better with time. GAD interferes with relationships, work, and school. GAD symptoms can vary from mild to severe. People with severe GAD can have intense waves of anxiety with physical symptoms that are similar to panic attacks. What are the causes? The exact cause of GAD is not known, but the following are believed to have an impact: Differences in natural brain chemicals. Genes passed down from parents to children. Differences in the way threats are perceived. Development during childhood. Personality. What increases the risk? The following factors may make you more likely to develop this condition: Being female. Having a family history of anxiety disorders. Being very shy. Experiencing very stressful life events, such as the death of a loved one. Having a very stressful family environment. What are the signs or symptoms? People with GAD often worry excessively about many things in their lives, such as their health and family. Symptoms may also include: Mental and emotional symptoms: Worrying excessively about natural disasters. Fear of being late. Difficulty concentrating. Fears that others are judging your performance. Physical symptoms: Fatigue. Headaches, muscle tension, muscle twitches, trembling, or feeling shaky. Feeling like your heart is pounding or beating very fast. Feeling out of breath or like you cannot take a deep breath. Having trouble falling asleep or staying asleep, or experiencing restlessness. Sweating. Nausea, diarrhea, or irritable bowel syndrome  (IBS). Behavioral symptoms: Experiencing erratic moods or irritability. Avoidance of new situations. Avoidance of people. Extreme difficulty making decisions. How is this diagnosed? This condition is diagnosed based on your symptoms and medical history. You will also have a physical exam. Your health care provider may perform tests to rule out other possible causes of your symptoms. To be diagnosed with GAD, a person must have anxiety that: Is out of his or her control. Affects several different aspects of his or her life, such as work and relationships. Causes distress that makes him or her unable to take part in normal activities. Includes at least three symptoms of GAD, such as restlessness, fatigue, trouble concentrating, irritability, muscle tension, or sleep problems. Before your health care provider can confirm a diagnosis of GAD, these symptoms must be present more days than they are not, and they must last for 6 months or longer. How is this treated? This condition may be treated with: Medicine. Antidepressant medicine is usually prescribed for long-term daily control. Anti-anxiety medicines may be added in severe cases, especially when panic attacks occur. Talk therapy (psychotherapy). Certain types of talk therapy can be helpful in treating GAD by providing support, education, and guidance. Options include: Cognitive behavioral therapy (CBT). People learn coping skills and self-calming techniques to ease their physical symptoms. They learn to identify unrealistic thoughts and behaviors and to replace them with more appropriate thoughts and behaviors. Acceptance and commitment therapy (ACT). This treatment teaches people how to be mindful as a way to cope with unwanted thoughts and feelings. Biofeedback. This process trains you to manage your body's response (physiological response) through breathing techniques and relaxation methods. You will work with a therapist while machines are  used to monitor your physical symptoms. Stress management techniques. These  include yoga, meditation, and exercise. A mental health specialist can help determine which treatment is best for you. Some people see improvement with one type of therapy. However, other people require a combination of therapies. Follow these instructions at home: Lifestyle Maintain a consistent routine and schedule. Anticipate stressful situations. Create a plan, and allow extra time to work with your plan. Practice stress management or self-calming techniques that you have learned from your therapist or your health care provider. General instructions Take over-the-counter and prescription medicines only as told by your health care provider. Understand that you are likely to have setbacks. Accept this and be kind to yourself as you persist to take better care of yourself. Recognize and accept your accomplishments, even if you judge them as small. Keep all follow-up visits as told by your health care provider. This is important. Contact a health care provider if: Your symptoms do not get better. Your symptoms get worse. You have signs of depression, such as: A persistently sad or irritable mood. Loss of enjoyment in activities that used to bring you joy. Change in weight or eating. Changes in sleeping habits. Avoiding friends or family members. Loss of energy for normal tasks. Feelings of guilt or worthlessness. Get help right away if: You have serious thoughts about hurting yourself or others. If you ever feel like you may hurt yourself or others, or have thoughts about taking your own life, get help right away. Go to your nearest emergency department or: Call your local emergency services (911 in the [place] Call a suicide crisis helpline, such as the [place] at 816 885 9122. This is open 24 hours a day in the [place] Text the Crisis Text Line at 857-017-8809 (in the  [place].). Summary Generalized anxiety disorder (GAD) is a mental health condition that involves worry that is not triggered by a specific event. People with GAD often worry excessively about many things in their lives, such as their health and family. GAD may cause symptoms such as restlessness, trouble concentrating, sleep problems, frequent sweating, nausea, diarrhea, headaches, and trembling or muscle twitching. A mental health specialist can help determine which treatment is best for you. Some people see improvement with one type of therapy. However, other people require a combination of therapies. This information is not intended to replace advice given to you by your health care provider. Make sure you discuss any questions you have with your health care provider. Document Revised: 09/06/2019 Document Reviewed: 09/06/2019 Elsevier Patient Education  [place].

## 2021-10-01 ENCOUNTER — Ambulatory Visit: Payer: Managed Care, Other (non HMO) | Admitting: Family Medicine

## 2021-10-06 ENCOUNTER — Observation Stay (HOSPITAL_COMMUNITY)
Admission: EM | Admit: 2021-10-06 | Discharge: 2021-10-07 | Disposition: A | Discharge status: Home / Self Care | Payer: Managed Care, Other (non HMO) | Attending: Internal Medicine | Admitting: Internal Medicine

## 2021-10-06 ENCOUNTER — Other Ambulatory Visit: Payer: Self-pay

## 2021-10-06 ENCOUNTER — Encounter (HOSPITAL_COMMUNITY): Payer: Self-pay | Admitting: Emergency Medicine

## 2021-10-06 ENCOUNTER — Emergency Department (HOSPITAL_COMMUNITY): Payer: Managed Care, Other (non HMO)

## 2021-10-06 DIAGNOSIS — Z794 Long term (current) use of insulin: Secondary | ICD-10-CM | POA: Diagnosis not present

## 2021-10-06 DIAGNOSIS — Z79899 Other long term (current) drug therapy: Secondary | ICD-10-CM | POA: Diagnosis not present

## 2021-10-06 DIAGNOSIS — Z20822 Contact with and (suspected) exposure to covid-19: Secondary | ICD-10-CM | POA: Insufficient documentation

## 2021-10-06 DIAGNOSIS — E1165 Type 2 diabetes mellitus with hyperglycemia: Secondary | ICD-10-CM | POA: Diagnosis not present

## 2021-10-06 DIAGNOSIS — I10 Essential (primary) hypertension: Secondary | ICD-10-CM | POA: Diagnosis not present

## 2021-10-06 DIAGNOSIS — J45909 Unspecified asthma, uncomplicated: Secondary | ICD-10-CM | POA: Diagnosis not present

## 2021-10-06 DIAGNOSIS — I471 Supraventricular tachycardia, unspecified: Secondary | ICD-10-CM

## 2021-10-06 DIAGNOSIS — R002 Palpitations: Secondary | ICD-10-CM | POA: Diagnosis present

## 2021-10-06 DIAGNOSIS — R6889 Other general symptoms and signs: Secondary | ICD-10-CM

## 2021-10-06 DIAGNOSIS — E119 Type 2 diabetes mellitus without complications: Secondary | ICD-10-CM

## 2021-10-06 DIAGNOSIS — E782 Mixed hyperlipidemia: Secondary | ICD-10-CM | POA: Diagnosis present

## 2021-10-06 DIAGNOSIS — R739 Hyperglycemia, unspecified: Secondary | ICD-10-CM

## 2021-10-06 LAB — COMPREHENSIVE METABOLIC PANEL
ALT: 21 U/L (ref 0–44)
AST: 25 U/L (ref 15–41)
Albumin: 4.1 g/dL (ref 3.5–5.0)
Alkaline Phosphatase: 126 U/L (ref 38–126)
Anion gap: 14 (ref 5–15)
BUN: 12 mg/dL (ref 6–20)
CO2: 19 mmol/L — ABNORMAL LOW (ref 22–32)
Calcium: 9.4 mg/dL (ref 8.9–10.3)
Chloride: 98 mmol/L (ref 98–111)
Creatinine, Ser: 1.07 mg/dL — ABNORMAL HIGH (ref 0.44–1.00)
GFR, Estimated: >60 mL/min (ref >60)
Glucose, Bld: 615 mg/dL (ref 70–99)
Potassium: 3.9 mmol/L (ref 3.5–5.1)
Sodium: 131 mmol/L — ABNORMAL LOW (ref 135–145)
Total Bilirubin: 0.4 mg/dL (ref 0.3–1.2)
Total Protein: 7.3 g/dL (ref 6.5–8.1)

## 2021-10-06 LAB — CBC WITH DIFFERENTIAL/PLATELET
Abs Immature Granulocytes: 0.04 10*3/uL (ref 0.00–0.07)
Basophils Absolute: 0.1 10*3/uL (ref 0.0–0.1)
Basophils Relative: 0 %
Eosinophils Absolute: 0.5 10*3/uL (ref 0.0–0.5)
Eosinophils Relative: 4 %
HCT: 42.6 % (ref 36.0–46.0)
Hemoglobin: 14.9 g/dL (ref 12.0–15.0)
Immature Granulocytes: 0 %
Lymphocytes Relative: 33 %
Lymphs Abs: 3.8 10*3/uL (ref 0.7–4.0)
MCH: 29.7 pg (ref 26.0–34.0)
MCHC: 35 g/dL (ref 30.0–36.0)
MCV: 85 fL (ref 80.0–100.0)
Monocytes Absolute: 0.6 10*3/uL (ref 0.1–1.0)
Monocytes Relative: 6 %
Neutro Abs: 6.3 10*3/uL (ref 1.7–7.7)
Neutrophils Relative %: 57 %
Platelets: 302 10*3/uL (ref 150–400)
RBC: 5.01 MIL/uL (ref 3.87–5.11)
RDW: 12.6 % (ref 11.5–15.5)
WBC: 11.3 10*3/uL — ABNORMAL HIGH (ref 4.0–10.5)
nRBC: 0 % (ref 0.0–0.2)

## 2021-10-06 LAB — RESP PANEL BY RT-PCR (FLU A&B, COVID) ARPGX2
Influenza A by PCR: NEGATIVE
Influenza B by PCR: NEGATIVE
SARS Coronavirus 2 by RT PCR: NEGATIVE

## 2021-10-06 MED ORDER — POTASSIUM CHLORIDE 10 MEQ/100ML IV SOLN
10.0000 meq | INTRAVENOUS | Status: AC
  Administered 2021-10-07 (×2): 10 meq via INTRAVENOUS
  Filled 2021-10-06 (×2): qty 100

## 2021-10-06 MED ORDER — ADENOSINE 6 MG/2ML IV SOLN: 6.0000 mg | Freq: Once | INTRAVENOUS | Status: AC

## 2021-10-06 MED ORDER — SODIUM CHLORIDE 0.9 % IV SOLN: INTRAVENOUS | Status: DC

## 2021-10-06 MED ORDER — DEXTROSE IN LACTATED RINGERS 5 % IV SOLN: INTRAVENOUS | Status: DC

## 2021-10-06 MED ORDER — SODIUM CHLORIDE 0.9 % IV BOLUS
1000.0000 mL | Freq: Once | INTRAVENOUS | Status: AC
  Administered 2021-10-06: 1000 mL via INTRAVENOUS

## 2021-10-06 MED ORDER — ADENOSINE 6 MG/2ML IV SOLN
INTRAVENOUS | Status: AC
  Administered 2021-10-06: 6 mg via INTRAVENOUS
  Filled 2021-10-06: qty 2

## 2021-10-06 MED ORDER — INSULIN REGULAR(HUMAN) IN NACL 100-0.9 UT/100ML-% IV SOLN: INTRAVENOUS | Status: DC

## 2021-10-06 MED ORDER — DEXTROSE 50 % IV SOLN: 0.0000 mL | INTRAVENOUS | Status: DC | PRN

## 2021-10-06 MED ORDER — LACTATED RINGERS IV SOLN: INTRAVENOUS | Status: DC

## 2021-10-06 MED ORDER — LACTATED RINGERS IV BOLUS
1500.0000 mL | Freq: Once | INTRAVENOUS | Status: AC
  Administered 2021-10-07: 1500 mL via INTRAVENOUS

## 2021-10-06 NOTE — ED Triage Notes (Signed)
Patient's heart rate =220+ at arrival with palpitations this evening , patient added dry cough and chest congestion/SOB .

## 2021-10-06 NOTE — ED Provider Notes (Addendum)
[facility] EMERGENCY DEPARTMENT Provider Note   CSN: [id number] Arrival date & time: 10/06/21  2026     History Chief Complaint  Patient presents with   Palpitations /Cough    HR= 220+    Jessica Ponce is a 55 y.o. female.  Patient brought in by POV.  Patient noted to have heart rate around 200.  Cardiac monitoring looks like SVT EKG looks like SVT.  Patient has had like a flulike illness since Thursday cough sore throat fever and around 2000 the heart rate got really fast.  And did not stop.  Past medical history significant for diabetes hypertension she has not been taking her insulin of late because she has not felt well.  No history of coronary disease.  Never had a history of fast heart rate.  Patient had a negative COVID test at home.  Patient with some slight discomfort between her shoulder blades.  The onset was after a coughing spell.      Past Medical History:  Diagnosis Date   Allergy    Asthma    no current problems, does not use inhaler   Diabetes mellitus without complication ([place])    type 2   Essential hypertension    Hyperlipidemia     Patient Active Problem List   Diagnosis Date Noted   Family history of thyroid disorder 07/15/2020   Mixed hyperlipidemia 04/23/2020   Family history of breast cancer 03/11/2020   Type 2 diabetes mellitus ([place]) 03/11/2020   Hypertension, essential 07/31/2016    Past Surgical History:  Procedure Laterality Date   APPENDECTOMY  1985   exploratory laparoscopy     WISDOM TOOTH EXTRACTION       OB History   No obstetric history on file.     Family History  Problem Relation Age of Onset   Memory loss Mother    Breast cancer Mother 6   Breast cancer Maternal Aunt 74   Colon cancer Neg Hx    Rectal cancer Neg Hx    Stomach cancer Neg Hx    Esophageal cancer Neg Hx     Social History   Tobacco Use   Smoking status: Never   Smokeless tobacco: Never  Vaping Use   Vaping Use: Never used   Substance Use Topics   Alcohol use: Yes    Alcohol/week: 0.0 standard drinks    Comment: occasional wine   Drug use: No    Home Medications Prior to Admission medications   Medication Sig Start Date End Date Taking? Authorizing Provider  Vitamin D, Ergocalciferol, (DRISDOL) 1.25 MG (50000 UNIT) CAPS capsule Take 1 capsule (50,000 Units total) by mouth every 7 (seven) days. 02/12/21   Nicolette Bang, MD  amLODipine (NORVASC) 10 MG tablet TAKE 1 TABLET BY MOUTH EVERY DAY 07/14/21   Camillia Herter, NP  Blood Glucose Monitoring Suppl (ONE TOUCH ULTRA MINI) w/Device KIT Used to check fasting FSBS daily. Dx: E11.9 07/15/20   Mayers, Cari S, PA-C  glucose blood (ONETOUCH ULTRA) test strip Check sugars twice daily. Dx: E11.9 01/08/21   Nicolette Bang, MD  insulin glargine, 1 Unit Dial, (TOUJEO SOLOSTAR) 300 UNIT/ML Solostar Pen Inject 26 Units into the skin daily. Inject 15 units daily, may increase by 2 units every 2 days if FBG >150. (max 24 units daily) 06/26/21   Feliberto Gottron, FNP  Insulin Pen Needle (PEN NEEDLES) 32G X 4 MM MISC 1 application by Does not apply route daily. 01/08/21  Nicolette Bang, MD  Lancets [facility] ULTRASOFT) lancets Use to check fasting FSBS daily. Dx: E11.9 07/15/20   Mayers, Cari S, PA-C  losartan (COZAAR) 100 MG tablet TAKE 1 TABLET BY MOUTH EVERY DAY 07/14/21   Camillia Herter, NP  rosuvastatin (CRESTOR) 10 MG tablet Take 2 tablets (20 mg total) by mouth daily. Take 1 tablet by mouth once a day. 07/03/21 10/01/21  Feliberto Gottron, FNP  Semaglutide, 1 MG/DOSE, (OZEMPIC, 1 MG/DOSE,) 4 MG/3ML SOPN Inject 1 mg into the skin once a week. 03/26/21   Nicolette Bang, MD  sertraline (ZOLOFT) 50 MG tablet Take 1 tablet (50 mg total) by mouth daily. 08/05/21 09/04/21  Fenton Foy, NP    Allergies    Valsartan, Atorvastatin, Codeine, and Metformin and related  Review of Systems   Review of Systems  Constitutional:  Negative for chills  and fever.  HENT:  Positive for congestion. Negative for ear pain and sore throat.   Eyes:  Negative for pain and visual disturbance.  Respiratory:  Positive for cough. Negative for shortness of breath.   Cardiovascular:  Positive for palpitations. Negative for chest pain.  Gastrointestinal:  Negative for abdominal pain, diarrhea, nausea and vomiting.  Genitourinary:  Negative for dysuria and hematuria.  Musculoskeletal:  Negative for arthralgias and back pain.  Skin:  Negative for color change and rash.  Neurological:  Negative for seizures and syncope.  All other systems reviewed and are negative.  Physical Exam Updated Vital Signs BP 117/89 (BP Location: Left Arm)   Pulse (!) 207   Temp 98.5 F (36.9 C) (Oral)   Resp 17   LMP 12/10/2015 (LMP Unknown)   SpO2 95%   Physical Exam Vitals and nursing note reviewed.  Constitutional:      General: She is not in acute distress.    Appearance: Normal appearance. She is well-developed.  HENT:     Head: Normocephalic and atraumatic.     Mouth/Throat:     Pharynx: Oropharynx is clear.  Eyes:     Extraocular Movements: Extraocular movements intact.     Conjunctiva/sclera: Conjunctivae normal.     Pupils: Pupils are equal, round, and reactive to light.  Cardiovascular:     Rate and Rhythm: Regular rhythm. Tachycardia present.     Heart sounds: No murmur heard. Pulmonary:     Effort: Pulmonary effort is normal. No respiratory distress.     Breath sounds: Normal breath sounds. No wheezing, rhonchi or rales.  Abdominal:     Palpations: Abdomen is soft.     Tenderness: There is no abdominal tenderness.  Musculoskeletal:        General: No swelling. Normal range of motion.     Cervical back: Normal range of motion and neck supple.  Skin:    General: Skin is warm and dry.     Capillary Refill: Capillary refill takes less than 2 seconds.  Neurological:     General: No focal deficit present.     Mental Status: She is alert and  oriented to person, place, and time.     Cranial Nerves: No cranial nerve deficit.     Sensory: No sensory deficit.     Motor: No weakness.    ED Results / Procedures / Treatments   Labs (all labs ordered are listed, but only abnormal results are displayed) Labs Reviewed  RESP PANEL BY RT-PCR (FLU A&B, COVID) [id number]  COMPREHENSIVE METABOLIC PANEL  CBC WITH DIFFERENTIAL/PLATELET  CBG MONITORING, ED  EKG EKG Interpretation  Date/Time:  Monday October 06 2021 21:03:54 EST Ventricular Rate:  204 PR Interval:    QRS Duration: 112 QT Interval:  218 QTC Calculation: 401 R Axis:   65 Text Interpretation: Supraventricular tachycardia Marked ST abnormality, possible inferior subendocardial injury Abnormal ECG Confirmed by Fredia Sorrow [phone]) on 10/06/2021 9:28:36 PM  Radiology No results found.  Procedures Procedures   Medications Ordered in ED Medications  adenosine (ADENOCARD) 6 MG/2ML injection (has no administration in time range)  0.9 %  sodium chloride infusion (has no administration in time range)  adenosine (ADENOCARD) 6 MG/2ML injection 6 mg (has no administration in time range)  sodium chloride 0.9 % bolus 1,000 mL (has no administration in time range)    ED Course  I have reviewed the triage vital signs and the nursing notes.  Pertinent labs & imaging results that were available during my care of the patient were reviewed by me and considered in my medical decision making (see chart for details).    MDM Rules/Calculators/A&P                         CRITICAL CARE Performed by: Fredia Sorrow Total critical care time: 45 minutes Critical care time was exclusive of separately billable procedures and treating other patients. Critical care was necessary to treat or prevent imminent or life-threatening deterioration. Critical care was time spent personally by me on the following activities: development of treatment plan with patient and/or surrogate as well  as nursing, discussions with consultants, evaluation of patient's response to treatment, examination of patient, obtaining history from patient or surrogate, ordering and performing treatments and interventions, ordering and review of laboratory studies, ordering and review of radiographic studies, pulse oximetry and re-evaluation of patient's condition.   Patient with rapid heart rate here consistent with an SVT.  Patient was placed on oxygen and CO2 monitoring.  Cardiac monitoring.  2 IVs established.  Cardiac pads placed.  Patient received 6 mg of adenosine had good pause and then converted into a sinus tach with heart rate around 120.  Patient overall feeling better.  Will get chest x-ray we will give an IV bolus of 1 L.  Patient's lungs are clear.  Certainly has significant cough that sounds like a bronchitis.  We will check labs.  And will continue to monitor.  Will not check troponin.  Will get COVID and flu testing.  Patient's blood sugar significantly elevated.  CO2 just 19.  Anion gap without significant abnormalities.  Potassium is normal.  Was planning to do the hyperglycemic protocol with IV insulin.  Discussed with hospitalist but they are going to go ahead and just treat with subcu insulin the go and put the orders in.  Patient's SVT is now controlled.  Flu and COVID testing negative.  But certainly patient with some sort of flulike illness and was dehydrated.  They will admit   Final Clinical Impression(s) / ED Diagnoses Final diagnoses:  Flu-like symptoms  SVT (supraventricular tachycardia) [facility])    Rx / DC Orders ED Discharge Orders     None        Fredia Sorrow, MD 10/06/21 2152    Fredia Sorrow, MD 10/07/21 [phone]

## 2021-10-07 ENCOUNTER — Inpatient Hospital Stay (HOSPITAL_BASED_OUTPATIENT_CLINIC_OR_DEPARTMENT_OTHER): Payer: Managed Care, Other (non HMO)

## 2021-10-07 ENCOUNTER — Encounter (HOSPITAL_COMMUNITY): Payer: Self-pay | Admitting: Family Medicine

## 2021-10-07 DIAGNOSIS — Z794 Long term (current) use of insulin: Secondary | ICD-10-CM

## 2021-10-07 DIAGNOSIS — I471 Supraventricular tachycardia: Secondary | ICD-10-CM

## 2021-10-07 DIAGNOSIS — E1165 Type 2 diabetes mellitus with hyperglycemia: Secondary | ICD-10-CM | POA: Diagnosis not present

## 2021-10-07 LAB — GLUCOSE, CAPILLARY
Glucose-Capillary: 362 mg/dL — ABNORMAL HIGH (ref 70–99)
Glucose-Capillary: 222 mg/dL — ABNORMAL HIGH (ref 70–99)
Glucose-Capillary: 173 mg/dL — ABNORMAL HIGH (ref 70–99)
Glucose-Capillary: 254 mg/dL — ABNORMAL HIGH (ref 70–99)
Glucose-Capillary: 266 mg/dL — ABNORMAL HIGH (ref 70–99)

## 2021-10-07 LAB — TROPONIN I (HIGH SENSITIVITY)
Troponin I (High Sensitivity): 27 ng/L — ABNORMAL HIGH (ref <18)
Troponin I (High Sensitivity): 22 ng/L — ABNORMAL HIGH (ref <18)

## 2021-10-07 LAB — HEMOGLOBIN A1C
Hgb A1c MFr Bld: 10.9 % — ABNORMAL HIGH (ref 4.8–5.6)
Mean Plasma Glucose: 266.13 mg/dL

## 2021-10-07 LAB — LIPID PANEL
Cholesterol: 229 mg/dL — ABNORMAL HIGH (ref 0–200)
HDL: 46 mg/dL (ref >40)
LDL Cholesterol: 130 mg/dL — ABNORMAL HIGH (ref 0–99)
Total CHOL/HDL Ratio: 5 RATIO
Triglycerides: 264 mg/dL — ABNORMAL HIGH (ref <150)
VLDL: 53 mg/dL — ABNORMAL HIGH (ref 0–40)

## 2021-10-07 LAB — I-STAT VENOUS BLOOD GAS, ED
Acid-base deficit: 1 mmol/L (ref 0.0–2.0)
Bicarbonate: 24 mmol/L (ref 20.0–28.0)
Calcium, Ion: 1.12 mmol/L — ABNORMAL LOW (ref 1.15–1.40)
HCT: 38 % (ref 36.0–46.0)
Hemoglobin: 12.9 g/dL (ref 12.0–15.0)
O2 Saturation: 83 %
Potassium: 3.7 mmol/L (ref 3.5–5.1)
Sodium: 136 mmol/L (ref 135–145)
TCO2: 25 mmol/L (ref 22–32)
pCO2, Ven: 39.6 mmHg — ABNORMAL LOW (ref 44.0–60.0)
pH, Ven: 7.391 (ref 7.250–7.430)
pO2, Ven: 48 mmHg — ABNORMAL HIGH (ref 32.0–45.0)

## 2021-10-07 LAB — ECHOCARDIOGRAM COMPLETE
AR max vel: 3.28 cm2
AV Peak grad: 7.2 mmHg
Ao pk vel: 1.34 m/s
Area-P 1/2: 4.19 cm2
Calc EF: 57.6 %
Height: 65 in
S' Lateral: 2.4 cm
Single Plane A2C EF: 58.2 %
Single Plane A4C EF: 57.7 %
Weight: 3188.73 oz

## 2021-10-07 LAB — CBC
HCT: 38.4 % (ref 36.0–46.0)
Hemoglobin: 12.8 g/dL (ref 12.0–15.0)
MCH: 29 pg (ref 26.0–34.0)
MCHC: 33.3 g/dL (ref 30.0–36.0)
MCV: 87.1 fL (ref 80.0–100.0)
Platelets: 257 10*3/uL (ref 150–400)
RBC: 4.41 MIL/uL (ref 3.87–5.11)
RDW: 12.5 % (ref 11.5–15.5)
WBC: 9.6 10*3/uL (ref 4.0–10.5)
nRBC: 0 % (ref 0.0–0.2)

## 2021-10-07 LAB — BASIC METABOLIC PANEL
Anion gap: 9 (ref 5–15)
BUN: 9 mg/dL (ref 6–20)
CO2: 23 mmol/L (ref 22–32)
Calcium: 8.5 mg/dL — ABNORMAL LOW (ref 8.9–10.3)
Chloride: 103 mmol/L (ref 98–111)
Creatinine, Ser: 0.74 mg/dL (ref 0.44–1.00)
GFR, Estimated: >60 mL/min (ref >60)
Glucose, Bld: 374 mg/dL — ABNORMAL HIGH (ref 70–99)
Potassium: 3.9 mmol/L (ref 3.5–5.1)
Sodium: 135 mmol/L (ref 135–145)

## 2021-10-07 LAB — BETA-HYDROXYBUTYRIC ACID
Beta-Hydroxybutyric Acid: 0.33 mmol/L — ABNORMAL HIGH (ref 0.05–0.27)
Beta-Hydroxybutyric Acid: 0.06 mmol/L (ref 0.05–0.27)

## 2021-10-07 LAB — HIV ANTIBODY (ROUTINE TESTING W REFLEX): HIV Screen 4th Generation wRfx: NONREACTIVE

## 2021-10-07 LAB — CBG MONITORING, ED: Glucose-Capillary: 403 mg/dL — ABNORMAL HIGH (ref 70–99)

## 2021-10-07 MED ORDER — ENOXAPARIN SODIUM 40 MG/0.4ML IJ SOSY
40.0000 mg | PREFILLED_SYRINGE | INTRAMUSCULAR | Status: DC
  Administered 2021-10-07: 40 mg via SUBCUTANEOUS
  Filled 2021-10-07: qty 0.4

## 2021-10-07 MED ORDER — INSULIN DETEMIR 100 UNIT/ML FLEXPEN: 12.0000 [IU] | PEN_INJECTOR | Freq: Two times a day (BID) | SUBCUTANEOUS | 3 refills | Status: DC

## 2021-10-07 MED ORDER — SERTRALINE HCL 50 MG PO TABS
50.0000 mg | ORAL_TABLET | Freq: Every day | ORAL | Status: DC
  Administered 2021-10-07: 50 mg via ORAL
  Filled 2021-10-07: qty 1

## 2021-10-07 MED ORDER — INSULIN ASPART 100 UNIT/ML IJ SOLN
0.0000 [IU] | Freq: Three times a day (TID) | INTRAMUSCULAR | Status: DC
  Administered 2021-10-07: 8 [IU] via SUBCUTANEOUS

## 2021-10-07 MED ORDER — INSULIN ASPART 100 UNIT/ML IJ SOLN
0.0000 [IU] | INTRAMUSCULAR | Status: DC | PRN
  Administered 2021-10-07: 5 [IU] via SUBCUTANEOUS

## 2021-10-07 MED ORDER — INSULIN GLARGINE-YFGN 100 UNIT/ML ~~LOC~~ SOLN
20.0000 [IU] | Freq: Every day | SUBCUTANEOUS | Status: DC
  Administered 2021-10-07: 20 [IU] via SUBCUTANEOUS
  Filled 2021-10-07: qty 0.2

## 2021-10-07 MED ORDER — LACTATED RINGERS IV SOLN: INTRAVENOUS | Status: DC

## 2021-10-07 MED ORDER — INSULIN ASPART 100 UNIT/ML IJ SOLN
10.0000 [IU] | Freq: Once | INTRAMUSCULAR | Status: AC
  Administered 2021-10-07: 10 [IU] via SUBCUTANEOUS

## 2021-10-07 MED ORDER — INSULIN ASPART 100 UNIT/ML IJ SOLN: 20.0000 [IU] | Freq: Once | INTRAMUSCULAR | Status: DC

## 2021-10-07 MED ORDER — ONDANSETRON HCL 4 MG/2ML IJ SOLN: 4.0000 mg | Freq: Four times a day (QID) | INTRAMUSCULAR | Status: DC | PRN

## 2021-10-07 MED ORDER — ACETAMINOPHEN 650 MG RE SUPP: 650.0000 mg | Freq: Four times a day (QID) | RECTAL | Status: DC | PRN

## 2021-10-07 MED ORDER — OZEMPIC (1 MG/DOSE) 4 MG/3ML ~~LOC~~ SOPN: 1.0000 mg | PEN_INJECTOR | SUBCUTANEOUS | 3 refills | Status: DC

## 2021-10-07 MED ORDER — INSULIN ASPART 100 UNIT/ML IJ SOLN: 0.0000 [IU] | Freq: Every day | INTRAMUSCULAR | Status: DC

## 2021-10-07 MED ORDER — LOSARTAN POTASSIUM 50 MG PO TABS: 100.0000 mg | ORAL_TABLET | Freq: Every day | ORAL | Status: DC

## 2021-10-07 MED ORDER — SERTRALINE HCL 50 MG PO TABS: 50.0000 mg | ORAL_TABLET | Freq: Every day | ORAL | Status: DC

## 2021-10-07 MED ORDER — ROSUVASTATIN CALCIUM 20 MG PO TABS
20.0000 mg | ORAL_TABLET | Freq: Every day | ORAL | Status: DC
  Filled 2021-10-07: qty 1

## 2021-10-07 MED ORDER — LOSARTAN POTASSIUM 50 MG PO TABS
100.0000 mg | ORAL_TABLET | Freq: Every day | ORAL | Status: DC
  Administered 2021-10-07: 100 mg via ORAL
  Filled 2021-10-07: qty 2

## 2021-10-07 MED ORDER — BENZONATATE 100 MG PO CAPS: 100.0000 mg | ORAL_CAPSULE | Freq: Three times a day (TID) | ORAL | Status: DC | PRN

## 2021-10-07 MED ORDER — ACETAMINOPHEN 325 MG PO TABS: 650.0000 mg | ORAL_TABLET | Freq: Four times a day (QID) | ORAL | Status: DC | PRN

## 2021-10-07 MED ORDER — ONDANSETRON HCL 4 MG PO TABS: 4.0000 mg | ORAL_TABLET | Freq: Four times a day (QID) | ORAL | Status: DC | PRN

## 2021-10-07 MED ORDER — PNEUMOCOCCAL VAC POLYVALENT 25 MCG/0.5ML IJ INJ: 0.5000 mL | INJECTION | INTRAMUSCULAR | Status: DC

## 2021-10-07 MED ORDER — AMLODIPINE BESYLATE 10 MG PO TABS: 10.0000 mg | ORAL_TABLET | Freq: Every day | ORAL | Status: DC

## 2021-10-07 MED ORDER — AMLODIPINE BESYLATE 10 MG PO TABS
10.0000 mg | ORAL_TABLET | Freq: Every day | ORAL | Status: DC
  Administered 2021-10-07: 10 mg via ORAL
  Filled 2021-10-07: qty 1

## 2021-10-07 MED ORDER — TOUJEO SOLOSTAR 300 UNIT/ML ~~LOC~~ SOPN: 26.0000 [IU] | PEN_INJECTOR | Freq: Every day | SUBCUTANEOUS | 2 refills | Status: DC

## 2021-10-07 MED ORDER — SEMAGLUTIDE (1 MG/DOSE) 4 MG/3ML ~~LOC~~ SOPN: 1.0000 mg | PEN_INJECTOR | SUBCUTANEOUS | Status: DC

## 2021-10-07 NOTE — Discharge Summary (Signed)
Physician Discharge Summary  Jessica Ponce PIR:[id number] DOB: [birth year] DOA: 10/06/2021  PCP: Dorna Mai, MD  Admit date: 10/06/2021 Discharge date: 10/07/2021  Admitted From: Home Disposition:  Home   Recommendations for Outpatient Follow-up:  Follow up with PCP in 1-2 weeks   Home Health:NO Discharge Condition:Stable CODE STATUS:FULL Diet recommendation: Heart Healthy / Carb Modified   Brief/Interim Summary:  Jessica Ponce is a 55 y.o. female with medical history significant for DMT2, HTN, HLD who presents for evaluation of palpitations.  She reports that for the last few days she has had upper respiratory illness symptoms of cough and congestion with mild sore throat.  Patient reports she has not been taking any of his insulin for last couple weeks due to financial constraints, upon presentation to ED she was noted to have SVT, with heart rate in the 200s, which responded to adenosine, as well her work-up was significant for hyperglycemia, she was admitted for further work-up.  Type 2 diabetes mellitus, uncontrolled with hyperglycemia -Patient is not in DKA, normal pH, normal bicarb, normal anion gap, she was admitted, kept on IV hydration, she received insulin glargine, with sliding scale, this morning her CBGs are controlled, her A1c is elevated at 10.9, patient was seen by diabetic coordinator and social worker, Toujeo 26 units daily will be changed to Levemir 12 units twice daily  SVT -No recurrence on telemetry, 2D echo is reassuring with no regional wall motion abnormality, preserved EF, this is in the setting of recent viral illness and uncontrolled hyperglycemia    Hypertension, essential Continue norvasc, cozaar. Monitor BP     Mixed hyperlipidemia Resume home dose of Crestor.     Discharge Diagnoses:  Principal Problem:   Type 2 diabetes mellitus with hyperglycemia (HCC) Active Problems:   Hypertension, essential   Mixed hyperlipidemia   SVT (supraventricular  tachycardia) (HCC)    Discharge Instructions  Discharge Instructions     Diet - low sodium heart healthy   Complete by: As directed    Discharge instructions   Complete by: As directed    Follow with Primary MD Dorna Mai, MD in 7 days   Get CBC, CMP,  checked  by Primary MD next visit.    Activity: As tolerated with Full fall precautions use walker/cane & assistance as needed   Disposition Home    Diet: Heart Healthy /diabetic diet  On your next visit with your primary care physician please Get Medicines reviewed and adjusted.   Please request your Prim.MD to go over all Hospital Tests and Procedure/Radiological results at the follow up, please get all Hospital records sent to your Prim MD by signing hospital release before you go home.   If you experience worsening of your admission symptoms, develop shortness of breath, life threatening emergency, suicidal or homicidal thoughts you must seek medical attention immediately by calling 911 or calling your MD immediately  if symptoms less severe.  You Must read complete instructions/literature along with all the possible adverse reactions/side effects for all the Medicines you take and that have been prescribed to you. Take any new Medicines after you have completely understood and accpet all the possible adverse reactions/side effects.   Do not drive, operating heavy machinery, perform activities at heights, swimming or participation in water activities or provide baby sitting services if your were admitted for syncope or siezures until you have seen by Primary MD or a Neurologist and advised to do so again.  Do not drive when taking Pain medications.  Do not take more than prescribed Pain, Sleep and Anxiety Medications  Special Instructions: If you have smoked or chewed Tobacco  in the last 2 yrs please stop smoking, stop any regular Alcohol  and or any Recreational drug use.  Wear Seat belts while  driving.   Please note  You were cared for by a hospitalist during your hospital stay. If you have any questions about your discharge medications or the care you received while you were in the hospital after you are discharged, you can call the unit and asked to speak with the hospitalist on call if the hospitalist that took care of you is not available. Once you are discharged, your primary care physician will handle any further medical issues. Please note that NO REFILLS for any discharge medications will be authorized once you are discharged, as it is imperative that you return to your primary care physician (or establish a relationship with a primary care physician if you do not have one) for your aftercare needs so that they can reassess your need for medications and monitor your lab values.   Increase activity slowly   Complete by: As directed       Allergies as of 10/07/2021       Reactions   Valsartan Diarrhea   Atorvastatin Other (See Comments)   Elevated sugars   Codeine Nausea And Vomiting   Metformin And Related Diarrhea        Medication List     STOP taking these medications    Toujeo SoloStar 300 UNIT/ML Solostar Pen Generic drug: insulin glargine (1 Unit Dial)       TAKE these medications    amLODipine 10 MG tablet Commonly known as: NORVASC TAKE 1 TABLET BY MOUTH EVERY DAY   insulin detemir 100 UNIT/ML FlexPen Commonly known as: LEVEMIR Inject 12 Units into the skin 2 (two) times daily.   losartan 100 MG tablet Commonly known as: COZAAR TAKE 1 TABLET BY MOUTH EVERY DAY   NYQUIL COLD & FLU PO Take 2 tablets by mouth at bedtime as needed (cold symptoms).   ONE TOUCH ULTRA MINI w/Device Kit Used to check fasting FSBS daily. Dx: E11.9   OneTouch Ultra test strip Generic drug: glucose blood Check sugars twice daily. Dx: E11.9   onetouch ultrasoft lancets Use to check fasting FSBS daily. Dx: E11.9   Ozempic (1 MG/DOSE) 4 MG/3ML Sopn Generic  drug: Semaglutide (1 MG/DOSE) Inject 1 mg into the skin once a week.   Pen Needles 32G X 4 MM Misc 1 application by Does not apply route daily.   rosuvastatin 10 MG tablet Commonly known as: Crestor Take 2 tablets (20 mg total) by mouth daily. Take 1 tablet by mouth once a day.   sertraline 50 MG tablet Commonly known as: ZOLOFT Take 1 tablet (50 mg total) by mouth daily.   Vitamin D (Ergocalciferol) 1.25 MG (50000 UNIT) Caps capsule Commonly known as: DRISDOL Take 1 capsule (50,000 Units total) by mouth every 7 (seven) days.        Follow-up Information     Dorna Mai, MD. Go on 10/21/2021.   Specialty: Family Medicine Why: _0 :Judeen Hammans information: [address] [place] [address] [phone]                Allergies  Allergen Reactions   Valsartan Diarrhea   Atorvastatin Other (See Comments)    Elevated sugars   Codeine Nausea And Vomiting   Metformin And Related Diarrhea  Consultations: none   Procedures/Studies: DG Chest Port 1 View  Result Date: 10/06/2021 CLINICAL DATA:  Flu-like symptoms and palpitation. EXAM: PORTABLE CHEST 1 VIEW COMPARISON:  Chest radiograph dated 08/10/2014. FINDINGS: No focal consolidation, pleural effusion or pneumothorax. The cardiac silhouette is within limits. No acute osseous pathology. IMPRESSION: No active disease. Electronically Signed   By: Anner Crete M.D.   On: 10/06/2021 22:07   ECHOCARDIOGRAM COMPLETE  Result Date: 10/07/2021    ECHOCARDIOGRAM REPORT   Patient Name:   Jessica Ponce Date of Exam: 10/07/2021 Medical Rec #:  [id number]   Height:       65.0 in Accession #:    [id number]  Weight:       199.3 lb Date of Birth:  1966-10-12   BSA:          1.975 m Patient Age:    44 years    BP:           125/79 mmHg Patient Gender: F           HR:           88 bpm. Exam Location:  Inpatient Procedure: 2D Echo, Cardiac Doppler and Color Doppler Indications:    SVT  History:        Patient has no  prior history of Echocardiogram examinations.                 Risk Factors:Hypertension and Diabetes.  Sonographer:    Jyl Heinz Referring Phys: Royal  1. Left ventricular ejection fraction, by estimation, is 60 to 65%. The left ventricle has normal function. The left ventricle has no regional wall motion abnormalities. Left ventricular diastolic parameters were normal.  2. Right ventricular systolic function is normal. The right ventricular size is normal.  3. The mitral valve is normal in structure. No evidence of mitral valve regurgitation. No evidence of mitral stenosis.  4. The aortic valve is normal in structure. Aortic valve regurgitation is not visualized. No aortic stenosis is present.  5. The inferior vena cava is normal in size with greater than 50% respiratory variability, suggesting right atrial pressure of 3 mmHg. FINDINGS  Left Ventricle: Left ventricular ejection fraction, by estimation, is 60 to 65%. The left ventricle has normal function. The left ventricle has no regional wall motion abnormalities. The left ventricular internal cavity size was normal in size. There is  no left ventricular hypertrophy. Left ventricular diastolic parameters were normal. Right Ventricle: The right ventricular size is normal. No increase in right ventricular wall thickness. Right ventricular systolic function is normal. Left Atrium: Left atrial size was normal in size. Right Atrium: Right atrial size was normal in size. Pericardium: There is no evidence of pericardial effusion. Mitral Valve: The mitral valve is normal in structure. No evidence of mitral valve regurgitation. No evidence of mitral valve stenosis. Tricuspid Valve: The tricuspid valve is normal in structure. Tricuspid valve regurgitation is not demonstrated. No evidence of tricuspid stenosis. Aortic Valve: The aortic valve is normal in structure. Aortic valve regurgitation is not visualized. No aortic stenosis is present.  Aortic valve peak gradient measures 7.2 mmHg. Pulmonic Valve: The pulmonic valve was normal in structure. Pulmonic valve regurgitation is not visualized. No evidence of pulmonic stenosis. Aorta: The aortic root is normal in size and structure. Venous: The inferior vena cava is normal in size with greater than 50% respiratory variability, suggesting right atrial pressure of 3 mmHg. IAS/Shunts: No atrial level shunt detected by color  flow Doppler.  LEFT VENTRICLE PLAX 2D LVIDd:         3.70 cm     Diastology LVIDs:         2.40 cm     LV e' medial:    7.94 cm/s LV PW:         1.00 cm     LV E/e' medial:  14.9 LV IVS:        1.20 cm     LV e' lateral:   8.49 cm/s LVOT diam:     2.10 cm     LV E/e' lateral: 13.9 LV SV:         89 LV SV Index:   45 LVOT Area:     3.46 cm  LV Volumes (MOD) LV vol d, MOD A2C: 85.0 ml LV vol d, MOD A4C: 77.5 ml LV vol s, MOD A2C: 35.5 ml LV vol s, MOD A4C: 32.8 ml LV SV MOD A2C:     49.5 ml LV SV MOD A4C:     77.5 ml LV SV MOD BP:      46.8 ml RIGHT VENTRICLE             IVC RV Basal diam:  3.50 cm     IVC diam: 1.50 cm RV Mid diam:    2.40 cm RV S prime:     16.00 cm/s TAPSE (M-mode): 2.3 cm LEFT ATRIUM             Index        RIGHT ATRIUM           Index LA diam:        3.40 cm 1.72 cm/m   RA Area:     13.40 cm LA Vol (A2C):   51.0 ml 25.82 ml/m  RA Volume:   33.80 ml  17.11 ml/m LA Vol (A4C):   38.2 ml 19.34 ml/m LA Biplane Vol: 46.0 ml 23.29 ml/m  AORTIC VALVE AV Area (Vmax): 3.28 cm AV Vmax:        134.00 cm/s AV Peak Grad:   7.2 mmHg LVOT Vmax:      127.00 cm/s LVOT Vmean:     89.800 cm/s LVOT VTI:       0.258 m  AORTA Ao Root diam: 3.30 cm Ao Asc diam:  3.40 cm MITRAL VALVE                TRICUSPID VALVE MV Area (PHT): 4.19 cm     TR Peak grad:   18.0 mmHg MV Decel Time: 181 msec     TR Vmax:        212.00 cm/s MV E velocity: 118.00 cm/s MV A velocity: 85.90 cm/s   SHUNTS MV E/A ratio:  1.37         Systemic VTI:  0.26 m                             Systemic Diam: 2.10 cm  Dani Gobble Croitoru MD Electronically signed by Sanda Klein MD Signature Date/Time: 10/07/2021/11:03:46 AM    Final       Subjective:  Patient reports she is feeling better today, no chest pain, no shortness of breath, no nausea or vomiting.  There is no significant events on telemetry, she is with normal sinus rhythm, no arrhythmias, QTC within normal limit.  Discharge Exam: Vitals:   10/07/21 0552 10/07/21 1154  BP: (!) 131/95 139/88  Pulse:  85  Resp:  17  Temp:  97.7 F (36.5 C)  SpO2:  94%   Vitals:   10/07/21 0237 10/07/21 0248 10/07/21 0552 10/07/21 1154  BP:  (!) 158/116 (!) 131/95 139/88  Pulse:  95  85  Resp:    17  Temp:  98.5 F (36.9 C)  97.7 F (36.5 C)  TempSrc:  Oral  Oral  SpO2:  99%  94%  Weight: 90.4 kg     Height: _0  (1.651 m)       General: Pt is alert, awake, not in acute distress Cardiovascular: RRR, S1/S2 +, no rubs, no gallops Respiratory: CTA bilaterally, no wheezing, no rhonchi Abdominal: Soft, NT, ND, bowel sounds + Extremities: no edema, no cyanosis    The results of significant diagnostics from this hospitalization (including imaging, microbiology, ancillary and laboratory) are listed below for reference.     Microbiology: Recent Results (from the past 240 hour(s))  Resp Panel by RT-PCR (Flu A&B, Covid) Nasopharyngeal Swab     Status: None   Collection Time: 10/06/21  9:37 PM   Specimen: Nasopharyngeal Swab; Nasopharyngeal(NP) swabs in vial transport medium  Result Value Ref Range Status   SARS Coronavirus 2 by RT PCR NEGATIVE NEGATIVE Final    Comment: (NOTE) SARS-CoV-2 target nucleic acids are NOT DETECTED.  The SARS-CoV-2 RNA is generally detectable in upper respiratory specimens during the acute phase of infection. The lowest concentration of SARS-CoV-2 viral copies this assay can detect is 138 copies/mL. A negative result does not preclude SARS-Cov-2 infection and should not be used as the sole basis for treatment or other  patient management decisions. A negative result may occur with  improper specimen collection/handling, submission of specimen other than nasopharyngeal swab, presence of viral mutation(s) within the areas targeted by this assay, and inadequate number of viral copies(<138 copies/mL). A negative result must be combined with clinical observations, patient history, and epidemiological information. The expected result is Negative.  Fact Sheet for Patients:  EntrepreneurPulse.com.au  Fact Sheet for Healthcare Providers:  IncredibleEmployment.be  This test is no t yet approved or cleared by the [place] FDA and  has been authorized for detection and/or diagnosis of SARS-CoV-2 by FDA under an Emergency Use Authorization (EUA). This EUA will remain  in effect (meaning this test can be used) for the duration of the COVID-19 declaration under Section 564(b)(1) of the Act, 21 U.S.C.section 360bbb-3(b)(1), unless the authorization is terminated  or revoked sooner.       Influenza A by PCR NEGATIVE NEGATIVE Final   Influenza B by PCR NEGATIVE NEGATIVE Final    Comment: (NOTE) The Xpert Xpress SARS-CoV-2/FLU/RSV plus assay is intended as an aid in the diagnosis of influenza from Nasopharyngeal swab specimens and should not be used as a sole basis for treatment. Nasal washings and aspirates are unacceptable for Xpert Xpress SARS-CoV-2/FLU/RSV testing.  Fact Sheet for Patients: EntrepreneurPulse.com.au  Fact Sheet for Healthcare Providers: IncredibleEmployment.be  This test is not yet approved or cleared by the [place] FDA and has been authorized for detection and/or diagnosis of SARS-CoV-2 by FDA under an Emergency Use Authorization (EUA). This EUA will remain in effect (meaning this test can be used) for the duration of the COVID-19 declaration under Section 564(b)(1) of the Act, 21 U.S.C. section  360bbb-3(b)(1), unless the authorization is terminated or revoked.  Performed at [facility], [place] [address] [place], [place] [address]      Labs: BNP (last 3 results)  No results for input(s): BNP in the last 8760 hours. Basic Metabolic Panel: Recent Labs  Lab 10/06/21 2139 10/07/21 0159 10/07/21 0341  NA 131* 136 135  K 3.9 3.7 3.9  CL 98  --  103  CO2 19*  --  23  GLUCOSE 615*  --  374*  BUN 12  --  9  CREATININE 1.07*  --  0.74  CALCIUM 9.4  --  8.5*   Liver Function Tests: Recent Labs  Lab 10/06/21 2139  AST 25  ALT 21  ALKPHOS 126  BILITOT 0.4  PROT 7.3  ALBUMIN 4.1   No results for input(s): LIPASE, AMYLASE in the last 168 hours. No results for input(s): AMMONIA in the last 168 hours. CBC: Recent Labs  Lab 10/06/21 2139 10/07/21 0159 10/07/21 0341  WBC 11.3*  --  9.6  NEUTROABS 6.3  --   --   HGB 14.9 12.9 12.8  HCT 42.6 38.0 38.4  MCV 85.0  --  87.1  PLT 302  --  257   Cardiac Enzymes: No results for input(s): CKTOTAL, CKMB, CKMBINDEX, TROPONINI in the last 168 hours. BNP: Invalid input(s): POCBNP CBG: Recent Labs  Lab 10/07/21 0318 10/07/21 0547 10/07/21 0758 10/07/21 1011 10/07/21 1136  GLUCAP 362* 222* 173* 254* 266*   D-Dimer No results for input(s): DDIMER in the last 72 hours. Hgb A1c Recent Labs    10/07/21 0341  HGBA1C 10.9*   Lipid Profile Recent Labs    10/07/21 0341  CHOL 229*  HDL 46  LDLCALC 130*  TRIG 264*  CHOLHDL 5.0   Thyroid function studies No results for input(s): TSH, T4TOTAL, T3FREE, THYROIDAB in the last 72 hours.  Invalid input(s): FREET3 Anemia work up No results for input(s): VITAMINB12, FOLATE, FERRITIN, TIBC, IRON, RETICCTPCT in the last 72 hours. Urinalysis    Component Value Date/Time   COLORURINE YELLOW 12/12/2015 2058   APPEARANCEUR CLEAR 12/12/2015 2058   LABSPEC 1.006 12/12/2015 2058   PHURINE 5.5 12/12/2015 2058   GLUCOSEU NEGATIVE 12/12/2015 2058   HGBUR NEGATIVE  12/12/2015 2058   BILIRUBINUR NEGATIVE 12/12/2015 2058   KETONESUR NEGATIVE 12/12/2015 2058   PROTEINUR NEGATIVE 12/12/2015 2058   UROBILINOGEN 0.2 10/03/2007 2219   NITRITE NEGATIVE 12/12/2015 2058   LEUKOCYTESUR NEGATIVE 12/12/2015 2058   Sepsis Labs Invalid input(s): PROCALCITONIN,  WBC,  LACTICIDVEN Microbiology Recent Results (from the past 240 hour(s))  Resp Panel by RT-PCR (Flu A&B, Covid) Nasopharyngeal Swab     Status: None   Collection Time: 10/06/21  9:37 PM   Specimen: Nasopharyngeal Swab; Nasopharyngeal(NP) swabs in vial transport medium  Result Value Ref Range Status   SARS Coronavirus 2 by RT PCR NEGATIVE NEGATIVE Final    Comment: (NOTE) SARS-CoV-2 target nucleic acids are NOT DETECTED.  The SARS-CoV-2 RNA is generally detectable in upper respiratory specimens during the acute phase of infection. The lowest concentration of SARS-CoV-2 viral copies this assay can detect is 138 copies/mL. A negative result does not preclude SARS-Cov-2 infection and should not be used as the sole basis for treatment or other patient management decisions. A negative result may occur with  improper specimen collection/handling, submission of specimen other than nasopharyngeal swab, presence of viral mutation(s) within the areas targeted by this assay, and inadequate number of viral copies(<138 copies/mL). A negative result must be combined with clinical observations, patient history, and epidemiological information. The expected result is Negative.  Fact Sheet for Patients:  EntrepreneurPulse.com.au  Fact Sheet for Healthcare Providers:  IncredibleEmployment.be  This test is no t yet approved or cleared by the [place] and  has been authorized for detection and/or diagnosis of SARS-CoV-2 by FDA under an Emergency Use Authorization (EUA). This EUA will remain  in effect (meaning this test can be used) for the duration of the COVID-19  declaration under Section 564(b)(1) of the Act, 21 U.S.C.section 360bbb-3(b)(1), unless the authorization is terminated  or revoked sooner.       Influenza A by PCR NEGATIVE NEGATIVE Final   Influenza B by PCR NEGATIVE NEGATIVE Final    Comment: (NOTE) The Xpert Xpress SARS-CoV-2/FLU/RSV plus assay is intended as an aid in the diagnosis of influenza from Nasopharyngeal swab specimens and should not be used as a sole basis for treatment. Nasal washings and aspirates are unacceptable for Xpert Xpress SARS-CoV-2/FLU/RSV testing.  Fact Sheet for Patients: EntrepreneurPulse.com.au  Fact Sheet for Healthcare Providers: IncredibleEmployment.be  This test is not yet approved or cleared by the [place] FDA and has been authorized for detection and/or diagnosis of SARS-CoV-2 by FDA under an Emergency Use Authorization (EUA). This EUA will remain in effect (meaning this test can be used) for the duration of the COVID-19 declaration under Section 564(b)(1) of the Act, 21 U.S.C. section 360bbb-3(b)(1), unless the authorization is terminated or revoked.  Performed at [facility], [place] [address], [place], [place] [address]      Time coordinating discharge:  30 minutes  SIGNED:   Phillips Climes, MD  [facility] 10/07/2021, 12:49 PM Pager   If 7PM-7AM, please contact night-coverage [email or web address] Password [email or web address]

## 2021-10-07 NOTE — Progress Notes (Signed)
Discharge instructions reviewed with pt and husband.  Copy of instructions given to pt. Pt aware of 2 scripts sent to her home pharmacy.  Pt getting dressed.  Pt to be d/c'd via wheelchair with belongings, with husband when dressed and calls out to front desk.  Unit staff will take pt out.

## 2021-10-07 NOTE — Progress Notes (Addendum)
Inpatient Diabetes Program Recommendations  AACE/ADA: New Consensus Statement on Inpatient Glycemic Control (2015)  Target Ranges:  Prepandial:   less than 140 mg/dL      Peak postprandial:   less than 180 mg/dL (1-2 hours)      Critically ill patients:  140 - 180 mg/dL   Lab Results  Component Value Date   GLUCAP 266 (H) 10/07/2021   HGBA1C 10.9 (H) 10/07/2021    Review of Glycemic Control A1c inc from 8.3 on 06/26/21 to currently 10.9 Diabetes history: DM2 Outpatient Diabetes medications: Toujeo 24, Ozempic 1 mg qw (has not been taking due to cost) Current orders for Inpatient glycemic control: Semglee 20 units qd, Novolog correction 0-15 units tid, 0-5 units hs.  Inpatient Diabetes Program Recommendations:   Consider adding: -Novolog 4-5 units meal coverage if eats 50%  Spoke with pt about A1C 10.9 (average blood glucose 266 over the past 2-3 months) and explained what an A1C is, basic pathophysiology of DM Type 2, basic home care, basic diabetes diet nutrition principles, importance of checking CBGs and maintaining good CBG control to prevent long-term and short-term complications. Reviewed signs and symptoms of hyperglycemia and hypoglycemia and how to treat hypoglycemia at home. Also reviewed blood sugar goals at home.  Patient has followed keto diet in the past and was off all insulin with good glycemic control. 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 Toujeo is financially stressful, and placed a transition of care consult to assist with determining lowest cost basal insulin for patient's insurance plan. Other option discussed with patient was Novolin relion insulin from [employer] 70/30 insulin which is approximately $43 for 5 pens = 1500 units. 70/30 18 units bid ac meals equal approximately 25.2 basal + 10.8 meal coverage.  Discussed nutrition and patient only has 30 minutes for dinner time so very limited in preparing.  Discussed premade meals that would be healthier.  A1c inc from 8.3 on 06/26/21 to currently 10.9  Received verbal order to assist patient with application of Libre CGM prior to discharge. Placed on back on right upper arm. Patient downloaded application on her iphone and scanned sensor to start.Instructions given on use.  Thank you, Nani Gasser. Dana Dorner, RN, MSN, CDE  Diabetes Coordinator Inpatient Glycemic Control Team Team Pager [phone] (8am-5pm) 10/07/2021 12:11 PM

## 2021-10-07 NOTE — H&P (Addendum)
History and Physical    Sommer Spickard KGM:[id number] DOB: 1965-12-26 DOA: 10/06/2021  PCP: Dorna Mai, MD   Patient coming from: Home  Chief Complaint: palpitations.  HPI: Jessica Ponce is a 55 y.o. female with medical history significant for DMT2, HTN, HLD who presents for evaluation of palpitations.  She reports that for the last few days she has had upper respiratory illness symptoms of cough and congestion with mild sore throat.  She states she had a subjective fever a few days ago but not in the last few days.  She states that on November 6 she took some NyQuil tablets before going to bed but did not take any on the day of November 7 on the evening of October 06, 2021 she had a hard coughing episode after which she felt her heart started to race very fast and she just felt fatigued and tired.  She was not feeling right so she had her husband bring her to the emergency room for evaluation and she was found to have a heart rate of 200 with an EKG showing SVT.  She does not have a history of cardiovascular disease and has never had a episode like this before.  She reports she did take a COVID test at home that was negative when she had the upper respiratory symptoms start.  She did have some shortness of breath when her heart rate was going so fast but is improved now.  She reports she cannot tolerate metformin due to GI side effects and is on Toujeo and Ozempic for her diabetes but has not used in past month due to financial stressors   ED Course: Initially she had a heart rate of 200 and SVT on EKG.  She was given adenosine which resolved the SVT.  She now has a sinus tachycardia with heart rate in the 100-105 range.  Blood pressures been stable.  Lab work showed glucose 615, sodium 131 potassium 3.9 chloride 98 bicarb 19 anion gap 14 creatinine 1.07 BUN 12 alkaline phosphatase 126 AST 25 ALT 21 total bilirubin 0.4 WBC 11,300 hemoglobin 14.9 hematocrit 42.6 platelets 302,000.  Hospitalist service  been asked to admit patient for further management of her hyperglycemia  Review of Systems:  General: Denies fever, chills, weight loss, night sweats. Denies dizziness. Denies change in appetite HENT: Denies head trauma, headache, denies change in hearing, tinnitus. Denies nasal congestion or bleeding.  Denies sore throat.  Denies difficulty swallowing Eyes: Denies blurry vision, pain in eye, drainage.  Denies discoloration of eyes. Neck: Denies pain.  Denies swelling.  Denies pain with movement. Cardiovascular: Reports palpitations. Denies chest pain.  Denies edema.  Denies orthopnea Respiratory: Reports cough. Denies shortness of breath.  Denies wheezing.  Denies sputum production Gastrointestinal: Denies abdominal pain, swelling.  Denies nausea, vomiting, diarrhea.  Denies melena.  Denies hematemesis. Musculoskeletal: Denies limitation of movement.  Denies deformity or swelling.  Denies arthralgias or myalgias. Genitourinary: Reports frequent urination past few days. Denies pelvic pain.  Denies dysuria.  Skin: Denies rash.  Denies petechiae, purpura, ecchymosis. Neurological: Denies syncope. Denies seizure activity. Denies paresthesia. Denies slurred speech, drooping face.  Denies visual change. Psychiatric: Denies depression, anxiety.  Denies hallucinations.  Past Medical History:  Diagnosis Date   Allergy    Asthma    no current problems, does not use inhaler   Diabetes mellitus without complication ([place])    type 2   Essential hypertension    Hyperlipidemia     Past Surgical History:  Procedure  Laterality Date   APPENDECTOMY  1985   exploratory laparoscopy     WISDOM TOOTH EXTRACTION      Social History  reports that she has never smoked. She has never used smokeless tobacco. She reports current alcohol use. She reports that she does not use drugs.  Allergies  Allergen Reactions   Valsartan Diarrhea   Atorvastatin Other (See Comments)    Elevated sugars   Codeine Nausea  Only    vomiting vomitting   Metformin And Related Diarrhea    Family History  Problem Relation Age of Onset   Memory loss Mother    Breast cancer Mother 64   Breast cancer Maternal Aunt 74   Colon cancer Neg Hx    Rectal cancer Neg Hx    Stomach cancer Neg Hx    Esophageal cancer Neg Hx      Prior to Admission medications   Medication Sig Start Date End Date Taking? Authorizing Provider  Vitamin D, Ergocalciferol, (DRISDOL) 1.25 MG (50000 UNIT) CAPS capsule Take 1 capsule (50,000 Units total) by mouth every 7 (seven) days. 02/12/21   Nicolette Bang, MD  amLODipine (NORVASC) 10 MG tablet TAKE 1 TABLET BY MOUTH EVERY DAY 07/14/21   Camillia Herter, NP  Blood Glucose Monitoring Suppl (ONE TOUCH ULTRA MINI) w/Device KIT Used to check fasting FSBS daily. Dx: E11.9 07/15/20   Mayers, Cari S, PA-C  glucose blood (ONETOUCH ULTRA) test strip Check sugars twice daily. Dx: E11.9 01/08/21   Nicolette Bang, MD  insulin glargine, 1 Unit Dial, (TOUJEO SOLOSTAR) 300 UNIT/ML Solostar Pen Inject 26 Units into the skin daily. Inject 15 units daily, may increase by 2 units every 2 days if FBG >150. (max 24 units daily) 06/26/21   Feliberto Gottron, FNP  Insulin Pen Needle (PEN NEEDLES) 32G X 4 MM MISC 1 application by Does not apply route daily. 01/08/21   Nicolette Bang, MD  Lancets [facility] ULTRASOFT) lancets Use to check fasting FSBS daily. Dx: E11.9 07/15/20   Mayers, Cari S, PA-C  losartan (COZAAR) 100 MG tablet TAKE 1 TABLET BY MOUTH EVERY DAY 07/14/21   Camillia Herter, NP  rosuvastatin (CRESTOR) 10 MG tablet Take 2 tablets (20 mg total) by mouth daily. Take 1 tablet by mouth once a day. 07/03/21 10/01/21  Feliberto Gottron, FNP  Semaglutide, 1 MG/DOSE, (OZEMPIC, 1 MG/DOSE,) 4 MG/3ML SOPN Inject 1 mg into the skin once a week. 03/26/21   Nicolette Bang, MD  sertraline (ZOLOFT) 50 MG tablet Take 1 tablet (50 mg total) by mouth daily. 08/05/21 09/04/21  Fenton Foy, NP     Physical Exam: Vitals:   10/06/21 2245 10/06/21 2300 10/06/21 2315 10/06/21 2345  BP: (!) 161/108 (!) 154/110 (!) 143/105 125/79  Pulse: (!) 120 (!) 115 (!) 124 (!) 108  Resp: (!) 23 20 (!) 21 (!) 23  Temp:      TempSrc:      SpO2: 98% 98% 97% 97%    Constitutional: NAD, calm, comfortable Vitals:   10/06/21 2245 10/06/21 2300 10/06/21 2315 10/06/21 2345  BP: (!) 161/108 (!) 154/110 (!) 143/105 125/79  Pulse: (!) 120 (!) 115 (!) 124 (!) 108  Resp: (!) 23 20 (!) 21 (!) 23  Temp:      TempSrc:      SpO2: 98% 98% 97% 97%   General: WDWN, Alert and oriented x3.  Eyes: EOMI, PERRL, conjunctivae normal.  Sclera nonicteric HENT:  [place]/AT, external ears normal.  Nares  patent without epistasis.  Mucous membranes are dry. Posterior pharynx clear Neck: Soft, normal range of motion, supple, no masses, no thyromegaly. Trachea midline Respiratory: clear to auscultation bilaterally, no wheezing, no crackles. Normal respiratory effort. No accessory muscle use.  Cardiovascular: Regular  rhythm, tachycardia, no murmurs / rubs / gallops. No extremity edema.  2+ pedal pulses.   Abdomen: Soft, no tenderness, nondistended, no rebound or guarding.  No masses palpated. Bowel sounds normoactive Musculoskeletal: FROM. No cyanosis. No joint deformity upper and lower extremities. Normal muscle tone.  Skin: Warm, dry, intact no rashes, lesions, ulcers. No induration Neurologic: CN 2-12 grossly intact.  Normal speech. Strength 5/5 in all extremities.   Psychiatric: Normal judgment and insight.  Normal mood.    Labs on Admission: I have personally reviewed following labs and imaging studies  CBC: Recent Labs  Lab 10/06/21 2139  WBC 11.3*  NEUTROABS 6.3  HGB 14.9  HCT 42.6  MCV 85.0  PLT 673    Basic Metabolic Panel: Recent Labs  Lab 10/06/21 2139  NA 131*  K 3.9  CL 98  CO2 19*  GLUCOSE 615*  BUN 12  CREATININE 1.07*  CALCIUM 9.4    GFR: CrCl cannot be calculated (Unknown ideal  weight.).  Liver Function Tests: Recent Labs  Lab 10/06/21 2139  AST 25  ALT 21  ALKPHOS 126  BILITOT 0.4  PROT 7.3  ALBUMIN 4.1    Urine analysis:    Component Value Date/Time   COLORURINE YELLOW 12/12/2015 2058   APPEARANCEUR CLEAR 12/12/2015 2058   LABSPEC 1.006 12/12/2015 2058   PHURINE 5.5 12/12/2015 2058   GLUCOSEU NEGATIVE 12/12/2015 2058   HGBUR NEGATIVE 12/12/2015 2058   BILIRUBINUR NEGATIVE 12/12/2015 2058   [place] NEGATIVE 12/12/2015 2058   PROTEINUR NEGATIVE 12/12/2015 2058   UROBILINOGEN 0.2 10/03/2007 2219   NITRITE NEGATIVE 12/12/2015 2058   LEUKOCYTESUR NEGATIVE 12/12/2015 2058    Radiological Exams on Admission: DG Chest Port 1 View  Result Date: 10/06/2021 CLINICAL DATA:  Flu-like symptoms and palpitation. EXAM: PORTABLE CHEST 1 VIEW COMPARISON:  Chest radiograph dated 08/10/2014. FINDINGS: No focal consolidation, pleural effusion or pneumothorax. The cardiac silhouette is within limits. No acute osseous pathology. IMPRESSION: No active disease. Electronically Signed   By: Anner Crete M.D.   On: 10/06/2021 22:07    EKG: Independently reviewed.  EKG initially showed SVT with heart rate of 200.  No acute ST elevation or depression.  QTc 401.  Repeat EKG is ordered and pending  Assessment/Plan Principal Problem:   Type 2 diabetes mellitus with hyperglycemia  Ms. Bluett is admitted to cardiac telemetry floor.  Is given IVF bolus in the ER. Continue IVF hydration with LR at 100 ml/hr overnight.  Given novolog 20 units now. Monitor Blood sugar every 2 hours overnight. Will provide further novolog as needed for continued hyperglycemia. Discussed plan with pharmacist who is in agreement with plan Resume Toujeo in am. Resume Omepzic in am Check HgbA1c. Last HgbA1c was in July and was 8.3 at that time.  Pt would benefit from Savings Cards for her diabetic medications which she can get from PCP or off internet.  Active Problems:   SVT (supraventricular  tachycardia) Was given Adenosine in ER and now in sinus tachycardia.  Recheck EKG now.  Check serial troponin levels.    Hypertension, essential Continue norvasc, cozaar. Monitor BP    Mixed hyperlipidemia Resume home dose of Crestor. Check lipid panel.   DVT prophylaxis: Lovenox for DVT prophylaxis  Code  Status:   Full Code  Family Communication:  Diagnosis and plan discussed with patient.  Patient verbalized understanding and agrees with plan.  Further recommendations to follow as clinically indicated Disposition Plan:   Patient is from:  Home  Anticipated DC to:  Home  Anticipated DC date:  Anticipate 2 midnight stay to treat acute condition  Admission status:  Inpatient  Yevonne Aline Felina Tello MD [facility]  How to contact the [facility] Attending or Consulting provider [place] or covering provider during after hours [place], for this patient?   Check the care team in [place] and look for a) attending/consulting [facility] provider listed and b) the [facility] team listed Log into [email or web address] and use [place]'s universal password to access. If you do not have the password, please contact the hospital operator. Locate the [facility] provider you are looking for under [facility] and page to a number that you can be directly reached. If you still have difficulty reaching the provider, please page the [facility] (Director on Call) for the Hospitalists listed on amion for assistance.  10/07/2021, 12:40 AM

## 2021-10-07 NOTE — Discharge Instructions (Signed)
Follow with Primary MD Dorna Mai, MD in 7 days   Get CBC, CMP,  checked  by Primary MD next visit.    Activity: As tolerated with Full fall precautions use walker/cane & assistance as needed   Disposition Home    Diet: Heart Healthy /diabetic diet  On your next visit with your primary care physician please Get Medicines reviewed and adjusted.   Please request your Prim.MD to go over all Hospital Tests and Procedure/Radiological results at the follow up, please get all Hospital records sent to your Prim MD by signing hospital release before you go home.   If you experience worsening of your admission symptoms, develop shortness of breath, life threatening emergency, suicidal or homicidal thoughts you must seek medical attention immediately by calling 911 or calling your MD immediately  if symptoms less severe.  You Must read complete instructions/literature along with all the possible adverse reactions/side effects for all the Medicines you take and that have been prescribed to you. Take any new Medicines after you have completely understood and accpet all the possible adverse reactions/side effects.   Do not drive, operating heavy machinery, perform activities at heights, swimming or participation in water activities or provide baby sitting services if your were admitted for syncope or siezures until you have seen by Primary MD or a Neurologist and advised to do so again.  Do not drive when taking Pain medications.    Do not take more than prescribed Pain, Sleep and Anxiety Medications  Special Instructions: If you have smoked or chewed Tobacco  in the last 2 yrs please stop smoking, stop any regular Alcohol  and or any Recreational drug use.  Wear Seat belts while driving.   Please note  You were cared for by a hospitalist during your hospital stay. If you have any questions about your discharge medications or the care you received while you were in the hospital after you  are discharged, you can call the unit and asked to speak with the hospitalist on call if the hospitalist that took care of you is not available. Once you are discharged, your primary care physician will handle any further medical issues. Please note that NO REFILLS for any discharge medications will be authorized once you are discharged, as it is imperative that you return to your primary care physician (or establish a relationship with a primary care physician if you do not have one) for your aftercare needs so that they can reassess your need for medications and monitor your lab values.

## 2021-10-07 NOTE — TOC Transition Note (Signed)
Transition of Care [facility]) - CM/SW Discharge Note   Patient Details  Name: Kyliyah Stirn MRN: [id number] Date of Birth: [birth year]  [facility] CM/SW Contact:  Zenon Mayo, RN Phone Number: 10/07/2021, 12:43 PM   Clinical Narrative:    Patient is for dc today, NCM informed MD and Diabetes coordinator that patient would like to change her toujeo solostar to  something cheaper, NCM spoke with [place]rene [place] diabetes coordiator she states the levimir flex pen would be cheaper. Judy Hanks will give dose to MD  for this. Patient states she is good with her other medications. She has transportation home today.    Final next level of care: Home/Self Care Barriers to Discharge: No Barriers Identified   Patient Goals and CMS Choice Patient states their goals for this hospitalization and ongoing recovery are:: return home   Choice offered to / list presented to : NA  Discharge Placement                       Discharge Plan and Services                  DME Agency: NA                  Social Determinants of Health (SDOH) Interventions     Readmission Risk Interventions No flowsheet data found.

## 2021-10-08 ENCOUNTER — Telehealth: Payer: Self-pay

## 2021-10-08 NOTE — Telephone Encounter (Signed)
Transition Care Management Unsuccessful Follow-up Telephone Call  Date of discharge and from where:  10/07/2021, [facility]   Attempts:  1st Attempt  Reason for unsuccessful TCM follow-up call:  Left voice message on # [phone], call back requested.   Patient has appointment with Dr Redmond Pulling @ [facility] - 10/21/2021

## 2021-10-09 ENCOUNTER — Telehealth: Payer: Self-pay

## 2021-10-09 NOTE — Telephone Encounter (Signed)
Transition Care Management Follow-up Telephone Call Date of discharge and from where: 10/07/2021, [facility]  How have you been since you were released from the hospital? She said she is doing really good.  She explained that the hospitalization was a wake up call for her and she has renewed a commitment to caring for herself  She said she knows what she needs to do and just needs to do it.  Any questions or concerns? Yes- she said that some family situations recently have caused her a lot of stress.  Her 65 yo daughter was hospitalized in September for increased anxiety and depression which increased her own anxiety. She needed to take time off from work and the lost pay caused even more anxiety. 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  She would like to speak with Asante McCoy, LCSW.  She said Asante can call and leave her a message if she is not able to answer the phone.   She was given a [employer] when in the hospital and she loves it.  She is hoping her insurance company will cover the cost of the sensors.    Items Reviewed: Did the pt receive and understand the discharge instructions provided? Yes  Medications obtained and verified? Yes - she said she has all medications and will need to call in for refills of her BP medications in few days  Other? No  Any new allergies since your discharge? No  Dietary orders reviewed? Yes - she said she knows what she should be eating and is making an extra effort now to stay on track.  Do you have support at home? Yes   Home Care and Equipment/Supplies: Were home health services ordered? no If so, what is the name of the agency? N/a  Has the agency set up a time to come to the patient's home? not applicable Were any new equipment or medical supplies ordered?  No What is the name of the medical supply agency? N/a Were you able to get the supplies/equipment? not applicable Do you have any questions  related to the use of the equipment or supplies? No  Functional Questionnaire: (I = Independent and D = Dependent) ADLs: independent   Follow up appointments reviewed:  [facility] Hospital f/u appt confirmed? Yes  Scheduled to see Dr Redmond Pulling on 10/21/2021. [employer] Hospital f/u appt confirmed?  None scheduled at this time   Are transportation arrangements needed? No  If their condition worsens, is the pt aware to call PCP or go to the Emergency Dept.? Yes Was the patient provided with contact information for the PCP's office or ED? Yes Was to pt encouraged to call back with questions or concerns? Yes

## 2021-10-10 ENCOUNTER — Telehealth: Payer: Self-pay

## 2021-10-10 ENCOUNTER — Other Ambulatory Visit: Payer: Self-pay | Admitting: Family

## 2021-10-10 ENCOUNTER — Other Ambulatory Visit: Payer: Self-pay | Admitting: *Deleted

## 2021-10-10 ENCOUNTER — Telehealth: Payer: Self-pay | Admitting: Neurology

## 2021-10-10 DIAGNOSIS — I10 Essential (primary) hypertension: Secondary | ICD-10-CM

## 2021-10-10 MED ORDER — LOSARTAN POTASSIUM 100 MG PO TABS: 100.0000 mg | ORAL_TABLET | Freq: Every day | ORAL | 0 refills | Status: DC

## 2021-10-10 MED ORDER — AMLODIPINE BESYLATE 10 MG PO TABS: 10.0000 mg | ORAL_TABLET | Freq: Every day | ORAL | 0 refills | Status: DC

## 2021-10-10 NOTE — Telephone Encounter (Signed)
Pt called, need to schedule initial CPAP. I do know that it is long over due. Have been using almost everyday, except when was in the hospital 11/7 and 11/8 since receiving CPAP machine 04/01/2021. What can be done to fix it. Would like to schedule the initial CPAP. Would like a call from the nurse

## 2021-10-10 NOTE — Telephone Encounter (Signed)
Pt called in requesting a refill on her losartan and amlodipine Pts has a visit scheduled on 10/21/21. States she does not have enough medication to last her until that appt

## 2021-10-12 ENCOUNTER — Encounter: Payer: Self-pay | Admitting: Neurology

## 2021-10-13 ENCOUNTER — Encounter: Payer: Self-pay | Admitting: *Deleted

## 2021-10-13 NOTE — Telephone Encounter (Signed)
I called the patient and left voicemail asking for call back ASAP.  I offered her a 15 minute appointment with Megan NP this Friday, November 18th at 10 AM arrival 9:45 AM.  I asked for the patient to let us know what kind of machine she has because if it is wireless then we may be able to do a virtual visit.  Otherwise she is welcome to come into the office.  I asked her to bring her CPAP machine and power cord.

## 2021-10-13 NOTE — Telephone Encounter (Signed)
I see pt is wireless on [employer]. This can be a video visit on mychart if she is ok with it. Otherwise she can come into the office for the appt and she does NOT need to bring her machine & power cord.

## 2021-10-13 NOTE — Telephone Encounter (Signed)
Jessica Ponce, will you offer pt an appointment this Thursday November 17th at 1:45 PM with Dr Rexene Alberts? Arrival 1:15 PM with cpap machine and power cord.

## 2021-10-13 NOTE — Telephone Encounter (Signed)
I called pt to schedule Initial Cpap. Pt stated that she was not able to take what was offered and is needing either an appt on a Wed which is her day off or any morning before 11:30am due to her starting work at noon.

## 2021-10-13 NOTE — Telephone Encounter (Signed)
May potentially have an opening on 11/18. Will address asap.

## 2021-10-13 NOTE — Telephone Encounter (Signed)
I was able to speak with the patient and get her scheduled for a MyChart video visit tomorrow morning at 7:45 AM with Dr. Rexene Alberts.

## 2021-10-14 ENCOUNTER — Telehealth (INDEPENDENT_AMBULATORY_CARE_PROVIDER_SITE_OTHER): Payer: Managed Care, Other (non HMO) | Admitting: Neurology

## 2021-10-14 ENCOUNTER — Encounter: Payer: Self-pay | Admitting: Neurology

## 2021-10-14 DIAGNOSIS — F439 Reaction to severe stress, unspecified: Secondary | ICD-10-CM

## 2021-10-14 DIAGNOSIS — Z9989 Dependence on other enabling machines and devices: Secondary | ICD-10-CM

## 2021-10-14 DIAGNOSIS — G4733 Obstructive sleep apnea (adult) (pediatric): Secondary | ICD-10-CM | POA: Diagnosis not present

## 2021-10-14 NOTE — Progress Notes (Signed)
Interim history:   Ms. Darrough is a 55 year old right-handed woman with an underlying medical history of diabetes, hypertension, hyperlipidemia, allergies, asthma, and obesity, who presents for a virtual video based visit through [facility] for follow-up consultation of her obstructive sleep apnea after a home sleep test and starting AutoPap therapy.  The patient is unaccompanied today and joins from home via cell phone, I am located in my office at [facility]. I first met her 08/20/2020, at which time she reported snoring and excessive daytime somnolence.  She was advised to proceed with a sleep study.  She had a home sleep test on 09/16/2020 indicated overall mild obstructive sleep apnea with an AHI of 11.7/h, O2 nadir 84%.  She was encouraged to start AutoPap therapy for symptom control.  Her set up date was 03/17/2021.    Today, 10/14/2021: I have reviewed her AutoPap compliance data from 09/01/2021 through 09/30/2021, which is a total of 30 days, during which time she used her machine every night with percent used days greater than 4 hours at 77%, indicating adequate compliance, she had great compliance back in mid August onwards.  Currently, her residual AHI is at goal at 0.8/h, pressure for the 95th percentile at 10.2 cm with a range of 5 cm to 11 cm, leak acceptable with a 95th percentile at 15.3 L/min.  She reports that she had significant interim stressors.  It took her several months to get her machine in the first place and once she got it she got COVID for the second time and could not use it consistently.  She was able to use it much more consistently after she got over the COVID symptoms.  She has benefited from treatment and endorses good compliance, improvement in her daytime energy and somnolence and better sleep consolidation and sleep quality at night, her husband is pleased that she does not snore any longer.  She has had interim stress back in the summer because of her  daughter's challenges.  Her daughter has finally been diagnosed with high functioning autism.  Patient was not taking strict care of her own diabetes and her own health and developed flulike symptoms several days ago and had a very significant cough with that.  She started having palpitations and needed to go to the emergency room.  I reviewed the emergency room and hospital records from last week, she was admitted from 10/06/2021 through 10/07/2021.  She had SVT, responded to adenosine, did not need any cardioversion but did get very scared.  Her blood sugar was highly elevated, A1c above 10.  She is endorsing better blood sugar control at home.  She is going to see a new primary care physician, Dr. Redmond Pulling next week.  She also endorses worsening symptoms of depression, she is only on sertraline 50 mg strength at this time and is wondering if her antidepressant needs to be increased.  She is holding up okay at this time.  She has a good support system, husband is very supportive.   She has some mouth dryness with the AutoPap use, she uses a nasal mask.  DME is [employer].  She is very motivated to continue with treatment.  Previously:   08/20/20: (She) reports snoring and excessive daytime somnolence.  I reviewed your office note from 07/15/2020.  Her Epworth sleepiness score is 8 out of 24, fatigue severity score is 37 out of 63.  She has some trouble going to sleep and staying asleep.  Since she started Zoloft for  anxiety, her sleep difficulties have improved a little bit.  She has no family history of sleep apnea.  She has had occasional morning headaches and has nocturia about once or twice per average night.  She lives with her family which includes her husband and younger daughter, aged 83, she also has a grandson, age 44 who lives in the [place] area.  She goes to bed between 11 and midnight and rise time is around 7 but she is often up by 6.  They have 2 cats in the household which do not sleep in their bedroom  typically.  She has a TV in the bedroom but does not watch it at night.  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  She works from 12:30 PM to 9 PM.  In the past, when she had difficulty going to sleep or staying asleep she tried Unisom and also melatonin, melatonin did not help very much.  She does not currently take any over-the-counter sleep aid or prescription sleep aid.  She is a non-smoker, drinks alcohol occasionally in the form of wine and drinks caffeine in the form of coffee, 2 cups in the mornings and sometimes a 3rd cup in the early afternoon.  She has recently noticed trembling that affects her when she is still and mostly feels it inside, her husband has never seen it, she does not have any hand tremor or head tremor or leg tremor, no family history of tremors but paternal grandfather had Parkinson's disease.    Her Past Medical History Is Significant For: Past Medical History:  Diagnosis Date   Allergy    Asthma    no current problems, does not use inhaler   Diabetes mellitus without complication ([place])    type 2   Essential hypertension    Hyperlipidemia     Her Past Surgical History Is Significant For: Past Surgical History:  Procedure Laterality Date   APPENDECTOMY  1985   exploratory laparoscopy     WISDOM TOOTH EXTRACTION      Her Family History Is Significant For: Family History  Problem Relation Age of Onset   Memory loss Mother    Breast cancer Mother 12   Breast cancer Maternal Aunt 74   Colon cancer Neg Hx    Rectal cancer Neg Hx    Stomach cancer Neg Hx    Esophageal cancer Neg Hx     Her Social History Is Significant For: Social History   Socioeconomic History   Marital status: Married    Spouse name: Legrand Como   Number of children: 2   Years of education: 16   Highest education level: Not on file  Occupational History   Not on file  Tobacco Use   Smoking status: Never   Smokeless tobacco: Never  Vaping Use   Vaping  Use: Never used  Substance and Sexual Activity   Alcohol use: Yes    Alcohol/week: 0.0 standard drinks    Comment: occasional wine   Drug use: No   Sexual activity: Yes    Birth control/protection: Post-menopausal  Other Topics Concern   Not on file  Social History Narrative   Lives with husband and 2 kids   Caffeine use: Drinks 2-3 cups coffee per day   Social Determinants of Health   Financial Resource Strain: Not on file  Food Insecurity: Not on file  Transportation Needs: Not on file  Physical Activity: Not on file  Stress: Not on file  Social Connections: Not on file    Her Allergies Are:  Allergies  Allergen Reactions   Valsartan Diarrhea   Atorvastatin Other (See Comments)    Elevated sugars   Codeine Nausea And Vomiting   Metformin And Related Diarrhea  :   Her Current Medications Are:  Outpatient Encounter Medications as of 10/14/2021  Medication Sig   Vitamin D, Ergocalciferol, (DRISDOL) 1.25 MG (50000 UNIT) CAPS capsule Take 1 capsule (50,000 Units total) by mouth every 7 (seven) days. (Patient not taking: Reported on 10/07/2021)   amLODipine (NORVASC) 10 MG tablet Take 1 tablet (10 mg total) by mouth daily.   Blood Glucose Monitoring Suppl (ONE TOUCH ULTRA MINI) w/Device KIT Used to check fasting FSBS daily. Dx: E11.9   DM-Doxylamine-Acetaminophen (NYQUIL COLD & FLU PO) Take 2 tablets by mouth at bedtime as needed (cold symptoms).   glucose blood (ONETOUCH ULTRA) test strip Check sugars twice daily. Dx: E11.9   insulin detemir (LEVEMIR) 100 UNIT/ML FlexPen Inject 12 Units into the skin 2 (two) times daily.   Insulin Pen Needle (PEN NEEDLES) 32G X 4 MM MISC 1 application by Does not apply route daily.   Lancets (ONETOUCH ULTRASOFT) lancets Use to check fasting FSBS daily. Dx: E11.9   losartan (COZAAR) 100 MG tablet Take 1 tablet (100 mg total) by mouth daily.   rosuvastatin (CRESTOR) 10 MG tablet Take 2 tablets (20 mg total) by mouth daily. Take 1 tablet by  mouth once a day. (Patient not taking: Reported on 10/07/2021)   Semaglutide, 1 MG/DOSE, (OZEMPIC, 1 MG/DOSE,) 4 MG/3ML SOPN Inject 1 mg into the skin once a week.   sertraline (ZOLOFT) 50 MG tablet Take 1 tablet (50 mg total) by mouth daily.   No facility-administered encounter medications on file as of 10/14/2021.  :  Review of Systems:   Out of a complete 14 point review of systems, all are reviewed and negative with the exception of these symptoms as listed below:  Virtual Visit via Video Note on 10/14/2021:  I connected with Crisol on 10/14/21 at  7:45 AM EST by a video enabled telemedicine application and verified that I am speaking with the correct person using two identifiers.   I discussed the limitations of evaluation and management by telemedicine and the availability of in person appointments. The patient expressed understanding and agreed to proceed.  History of Present Illness:  See above.    Observations/Objective:   Assessment and Plan:  In summary, Hope Brandenburger is a very pleasant 55 y.o.-year old female with an underlying medical history of diabetes, hypertension, hyperlipidemia, allergies, asthma, and obesity, who presents for follow-up consultation of her overall mild obstructive sleep apnea as determined by home sleep testing on 09/16/2020.  She has established treatment with AutoPap therapy since 03/17/2021 and is currently compliant with treatment, had a little bit of fluctuation in the beginning of November due to acute illness and also when she first got the machine due to COVID symptoms.  She has had significant stress in the interim what with her own health and also being concerned about her 3 year old daughter.  Hopefully things are settling down and she is endorsing good diabetes control at this moment.  She is going to establish with a new primary care, for her residual depression she is encouraged to talk to her PCP about a potential increase in her sertraline.   She is open to this.  She is benefiting from AutoPap therapy.  Thankfully she has been able to be consistent  with treatment and tolerates it well.  Numbers look good including residual AHI and leak, average pressure is within the range of the settings. At this juncture, since she is compliant with treatment and benefits from it, she is advised to follow-up routinely in this office to see one of our nurse practitioners in about a year.  She is commended for her treatment adherence.  I answered all her questions today and she was in agreement with the plan.    Follow Up Instructions:    I discussed the assessment and treatment plan with the patient. The patient was provided an opportunity to ask questions and all were answered. The patient agreed with the plan and demonstrated an understanding of the instructions.   The patient was advised to call back or seek an in-person evaluation if the symptoms worsen or if the condition fails to improve as anticipated.  I provided 30 minutes of non-face-to-face time during this encounter.   Star Age, MD

## 2021-10-14 NOTE — Patient Instructions (Signed)
Verbal instructions given during virtual visit.

## 2021-10-17 NOTE — Telephone Encounter (Signed)
First attempt to reach this pt, no answer, left vm

## 2021-10-19 NOTE — Telephone Encounter (Signed)
noted 

## 2021-10-21 ENCOUNTER — Encounter: Payer: Self-pay | Admitting: Family Medicine

## 2021-10-21 ENCOUNTER — Ambulatory Visit (INDEPENDENT_AMBULATORY_CARE_PROVIDER_SITE_OTHER): Payer: Managed Care, Other (non HMO) | Admitting: Family Medicine

## 2021-10-21 ENCOUNTER — Other Ambulatory Visit: Payer: Self-pay

## 2021-10-21 VITALS — BP 120/81 | HR 81 | Temp 98.1°F | Resp 16 | Ht 65.0 in | Wt 195.8 lb

## 2021-10-21 DIAGNOSIS — E119 Type 2 diabetes mellitus without complications: Secondary | ICD-10-CM

## 2021-10-21 DIAGNOSIS — I10 Essential (primary) hypertension: Secondary | ICD-10-CM

## 2021-10-21 DIAGNOSIS — I471 Supraventricular tachycardia: Secondary | ICD-10-CM

## 2021-10-21 DIAGNOSIS — Z794 Long term (current) use of insulin: Secondary | ICD-10-CM

## 2021-10-21 DIAGNOSIS — F411 Generalized anxiety disorder: Secondary | ICD-10-CM

## 2021-10-21 DIAGNOSIS — Z09 Encounter for follow-up examination after completed treatment for conditions other than malignant neoplasm: Secondary | ICD-10-CM

## 2021-10-21 MED ORDER — PEN NEEDLES 32G X 4 MM MISC: 1.0000 "application " | Freq: Every day | 3 refills | Status: DC

## 2021-10-21 MED ORDER — SERTRALINE HCL 100 MG PO TABS: 100.0000 mg | ORAL_TABLET | Freq: Every day | ORAL | 1 refills | Status: DC

## 2021-10-21 MED ORDER — INSULIN DETEMIR 100 UNIT/ML FLEXPEN: 12.0000 [IU] | PEN_INJECTOR | Freq: Two times a day (BID) | SUBCUTANEOUS | 2 refills | Status: DC

## 2021-10-21 MED ORDER — LOSARTAN POTASSIUM 100 MG PO TABS: 100.0000 mg | ORAL_TABLET | Freq: Every day | ORAL | 1 refills | Status: DC

## 2021-10-21 MED ORDER — FREESTYLE LIBRE 2 SENSOR MISC: 5 refills | Status: DC

## 2021-10-21 MED ORDER — AMLODIPINE BESYLATE 10 MG PO TABS: 10.0000 mg | ORAL_TABLET | Freq: Every day | ORAL | 1 refills | Status: DC

## 2021-10-21 NOTE — Progress Notes (Signed)
Patient is here for HFU Patient would like to talk about medication change

## 2021-10-21 NOTE — Progress Notes (Signed)
Established Patient Office Visit  Subjective:  Patient ID: Jessica Ponce, female    DOB: [birth year]  Age: 55 y.o. MRN: [id number]  CC:  Chief Complaint  Patient presents with   Follow-up    HPI Jessica Ponce presents for hospital discharge follow up where she was admitted with SVT most likely 2/2 hyperglycemia. Patient reports improvement since discharge. Patient would also like to increase her zoloft.   Past Medical History:  Diagnosis Date   Allergy    Asthma    no current problems, does not use inhaler   Diabetes mellitus without complication ([place])    type 2   Essential hypertension    Hyperlipidemia     Past Surgical History:  Procedure Laterality Date   APPENDECTOMY  1985   exploratory laparoscopy     WISDOM TOOTH EXTRACTION      Family History  Problem Relation Age of Onset   Memory loss Mother    Breast cancer Mother 27   Breast cancer Maternal Aunt 74   Colon cancer Neg Hx    Rectal cancer Neg Hx    Stomach cancer Neg Hx    Esophageal cancer Neg Hx     Social History   Socioeconomic History   Marital status: Married    Spouse name: Legrand Como   Number of children: 2   Years of education: 16   Highest education level: Not on file  Occupational History   Not on file  Tobacco Use   Smoking status: Never   Smokeless tobacco: Never  Vaping Use   Vaping Use: Never used  Substance and Sexual Activity   Alcohol use: Yes    Alcohol/week: 0.0 standard drinks    Comment: occasional wine   Drug use: No   Sexual activity: Yes    Birth control/protection: Post-menopausal  Other Topics Concern   Not on file  Social History Narrative   Lives with husband and 2 kids   Caffeine use: Drinks 2-3 cups coffee per day   Social Determinants of Health   Financial Resource Strain: Not on file  Food Insecurity: Not on file  Transportation Needs: Not on file  Physical Activity: Not on file  Stress: Not on file  Social Connections: Not on file  Intimate Partner  Violence: Not on file    ROS Review of Systems  Psychiatric/Behavioral:  Negative for self-injury, sleep disturbance and suicidal ideas. The patient is nervous/anxious.   All other systems reviewed and are negative.  Objective:   Today's Vitals: BP 120/81   Pulse 81   Temp 98.1 F (36.7 C) (Oral)   Resp 16   Ht 5' 5"  (1.651 m)   Wt 195 lb 12.8 oz (88.8 kg)   LMP 12/10/2015 (LMP Unknown)   BMI 32.58 kg/m   Physical Exam Vitals and nursing note reviewed.  Constitutional:      General: She is not in acute distress. Cardiovascular:     Rate and Rhythm: Normal rate and regular rhythm.  Pulmonary:     Effort: Pulmonary effort is normal.     Breath sounds: Normal breath sounds.  Abdominal:     Palpations: Abdomen is soft.     Tenderness: There is no abdominal tenderness.  Musculoskeletal:     Right lower leg: No edema.     Left lower leg: No edema.  Neurological:     General: No focal deficit present.     Mental Status: She is alert and oriented to person, place, and time.  Psychiatric:        Mood and Affect: Mood normal.        Behavior: Behavior normal.    Assessment & Plan:   1. Type 2 diabetes mellitus without complication, with long-term current use of insulin (HCC) Recent A1c was elevated. Discussed dietary and activity options. Discussed compliance. Meds refilled.   2. Essential hypertension Appears stable. Continue present management. Meds refilled - losartan (COZAAR) 100 MG tablet; Take 1 tablet (100 mg total) by mouth daily.  Dispense: 90 tablet; Refill: 1 - amLODipine (NORVASC) 10 MG tablet; Take 1 tablet (10 mg total) by mouth daily.  Dispense: 90 tablet; Refill: 1  3. Anxiety state Will increase zoloft from 50 mg daily to 132m daily and monitor  4. SVT (supraventricular tachycardia) (HCC) Resolved.  [facility]    Outpatient Encounter Medications as of 10/21/2021  Medication Sig   Vitamin D, Ergocalciferol, (DRISDOL) 1.25  MG (50000 UNIT) CAPS capsule Take 1 capsule (50,000 Units total) by mouth every 7 (seven) days. (Patient not taking: Reported on 10/07/2021)   amLODipine (NORVASC) 10 MG tablet Take 1 tablet (10 mg total) by mouth daily.   Blood Glucose Monitoring Suppl (ONE TOUCH ULTRA MINI) w/Device KIT Used to check fasting FSBS daily. Dx: E11.9   DM-Doxylamine-Acetaminophen (NYQUIL COLD & FLU PO) Take 2 tablets by mouth at bedtime as needed (cold symptoms).   glucose blood (ONETOUCH ULTRA) test strip Check sugars twice daily. Dx: E11.9   insulin detemir (LEVEMIR) 100 UNIT/ML FlexPen Inject 12 Units into the skin 2 (two) times daily.   Insulin Pen Needle (PEN NEEDLES) 32G X 4 MM MISC 1 application by Does not apply route daily.   Lancets (ONETOUCH ULTRASOFT) lancets Use to check fasting FSBS daily. Dx: E11.9   losartan (COZAAR) 100 MG tablet Take 1 tablet (100 mg total) by mouth daily.   rosuvastatin (CRESTOR) 10 MG tablet Take 2 tablets (20 mg total) by mouth daily. Take 1 tablet by mouth once a day. (Patient not taking: Reported on 10/07/2021)   Semaglutide, 1 MG/DOSE, (OZEMPIC, 1 MG/DOSE,) 4 MG/3ML SOPN Inject 1 mg into the skin once a week.   sertraline (ZOLOFT) 50 MG tablet Take 1 tablet (50 mg total) by mouth daily.   No facility-administered encounter medications on file as of 10/21/2021.    Follow-up: No follow-ups on file.   WBecky Sax MD

## 2022-01-21 ENCOUNTER — Encounter: Payer: Self-pay | Admitting: Family Medicine

## 2022-01-21 ENCOUNTER — Ambulatory Visit (INDEPENDENT_AMBULATORY_CARE_PROVIDER_SITE_OTHER): Payer: Managed Care, Other (non HMO) | Admitting: Family Medicine

## 2022-01-21 ENCOUNTER — Other Ambulatory Visit: Payer: Self-pay

## 2022-01-21 VITALS — BP 118/81 | HR 76 | Temp 98.2°F | Resp 16 | Wt 188.2 lb

## 2022-01-21 DIAGNOSIS — E119 Type 2 diabetes mellitus without complications: Secondary | ICD-10-CM

## 2022-01-21 DIAGNOSIS — Z794 Long term (current) use of insulin: Secondary | ICD-10-CM | POA: Diagnosis not present

## 2022-01-21 DIAGNOSIS — I10 Essential (primary) hypertension: Secondary | ICD-10-CM | POA: Diagnosis not present

## 2022-01-21 DIAGNOSIS — E782 Mixed hyperlipidemia: Secondary | ICD-10-CM

## 2022-01-21 DIAGNOSIS — F32A Depression, unspecified: Secondary | ICD-10-CM | POA: Diagnosis not present

## 2022-01-21 DIAGNOSIS — F419 Anxiety disorder, unspecified: Secondary | ICD-10-CM | POA: Diagnosis not present

## 2022-01-21 LAB — POCT GLYCOSYLATED HEMOGLOBIN (HGB A1C): Hemoglobin A1C: 7.9 % — AB (ref 4.0–5.6)

## 2022-01-21 MED ORDER — FREESTYLE LIBRE 2 SENSOR MISC: 5 refills | Status: DC

## 2022-01-21 MED ORDER — SERTRALINE HCL 100 MG PO TABS: 100.0000 mg | ORAL_TABLET | Freq: Every day | ORAL | 1 refills | Status: DC

## 2022-01-21 NOTE — Progress Notes (Signed)
Patient is her for DM follow-up 3 months  Patient said she has no new concerns today and she is doing well

## 2022-01-21 NOTE — Progress Notes (Signed)
Established Patient Office Visit  Subjective:  Patient ID: Jessica Ponce, female    DOB: 08/21/1966  Age: 56 y.o. MRN: [id number]  CC:  Chief Complaint  Patient presents with   Follow-up   Diabetes    HPI Jessica Ponce presents for follow up of chronic med issues including diabetes and hypertension. Patient denies acute complaints or concerns.   Past Medical History:  Diagnosis Date   Allergy    Asthma    no current problems, does not use inhaler   Diabetes mellitus without complication ([place])    type 2   Essential hypertension    Hyperlipidemia     Past Surgical History:  Procedure Laterality Date   APPENDECTOMY  1985   exploratory laparoscopy     WISDOM TOOTH EXTRACTION      Family History  Problem Relation Age of Onset   Memory loss Mother    Breast cancer Mother 53   Breast cancer Maternal Aunt 74   Colon cancer Neg Hx    Rectal cancer Neg Hx    Stomach cancer Neg Hx    Esophageal cancer Neg Hx     Social History   Socioeconomic History   Marital status: Married    Spouse name: Legrand Como   Number of children: 2   Years of education: 16   Highest education level: Not on file  Occupational History   Not on file  Tobacco Use   Smoking status: Never   Smokeless tobacco: Never  Vaping Use   Vaping Use: Never used  Substance and Sexual Activity   Alcohol use: Yes    Alcohol/week: 0.0 standard drinks    Comment: occasional wine   Drug use: No   Sexual activity: Yes    Birth control/protection: Post-menopausal  Other Topics Concern   Not on file  Social History Narrative   Lives with husband and 2 kids   Caffeine use: Drinks 2-3 cups coffee per day   Social Determinants of Health   Financial Resource Strain: Not on file  Food Insecurity: Not on file  Transportation Needs: Not on file  Physical Activity: Not on file  Stress: Not on file  Social Connections: Not on file  Intimate Partner Violence: Not on file    ROS Review of Systems  All  other systems reviewed and are negative.  Objective:   Today's Vitals: BP 118/81    Pulse 76    Temp 98.2 F (36.8 C) (Oral)    Resp 16    Wt 188 lb 3.2 oz (85.4 kg)    LMP 12/10/2015 (LMP Unknown)    SpO2 94%    BMI 31.32 kg/m   Physical Exam Vitals and nursing note reviewed.  Constitutional:      General: She is not in acute distress. Cardiovascular:     Rate and Rhythm: Normal rate and regular rhythm.  Pulmonary:     Effort: Pulmonary effort is normal.     Breath sounds: Normal breath sounds.  Abdominal:     Palpations: Abdomen is soft.     Tenderness: There is no abdominal tenderness.  Musculoskeletal:     Right lower leg: No edema.     Left lower leg: No edema.  Neurological:     General: No focal deficit present.     Mental Status: She is alert and oriented to person, place, and time.  Psychiatric:        Mood and Affect: Mood normal.  Behavior: Behavior normal.    Assessment & Plan:   1. Type 2 diabetes mellitus without complication, with long-term current use of insulin (HCC) Much improved and near goal. Will d/c ozempic and monitor 2/2 GI SE as well as some low glucose episodes.  Sensor refilled.  - POCT glycosylated hemoglobin (Hb A1C)  2. Anxiety and depression Appears stable with present management. Continue and monitor  3. Hypertension, essential Will decreased amlodipine from 10 mg to 26m 2/2 low readings and monitor  4. Mixed hyperlipidemia Continue present management.     Outpatient Encounter Medications as of 01/21/2022  Medication Sig   amLODipine (NORVASC) 10 MG tablet Take 1 tablet (10 mg total) by mouth daily.   Blood Glucose Monitoring Suppl (ONE TOUCH ULTRA MINI) w/Device KIT Used to check fasting FSBS daily. Dx: E11.9   Continuous Blood Gluc Sensor (FREESTYLE LIBRE 2 SENSOR) MISC Utilize as directed to check blood glucose   DM-Doxylamine-Acetaminophen (NYQUIL COLD & FLU PO) Take 2 tablets by mouth at bedtime as needed (cold symptoms).    glucose blood (ONETOUCH ULTRA) test strip Check sugars twice daily. Dx: E11.9   insulin detemir (LEVEMIR) 100 UNIT/ML FlexPen Inject 12 Units into the skin 2 (two) times daily.   Insulin Pen Needle (PEN NEEDLES) 32G X 4 MM MISC 1 application by Does not apply route daily.   Lancets (ONETOUCH ULTRASOFT) lancets Use to check fasting FSBS daily. Dx: E11.9   losartan (COZAAR) 100 MG tablet Take 1 tablet (100 mg total) by mouth daily.   Semaglutide, 1 MG/DOSE, (OZEMPIC, 1 MG/DOSE,) 4 MG/3ML SOPN Inject 1 mg into the skin once a week.   sertraline (ZOLOFT) 100 MG tablet Take 1 tablet (100 mg total) by mouth daily.   Vitamin D, Ergocalciferol, (DRISDOL) 1.25 MG (50000 UNIT) CAPS capsule Take 1 capsule (50,000 Units total) by mouth every 7 (seven) days.   [DISCONTINUED] rosuvastatin (CRESTOR) 10 MG tablet Take 2 tablets (20 mg total) by mouth daily. Take 1 tablet by mouth once a day. (Patient not taking: Reported on 10/07/2021)   No facility-administered encounter medications on file as of 01/21/2022.    Follow-up: Return in about 3 months (around 04/20/2022) for follow up.   WBecky Sax MD

## 2022-01-22 ENCOUNTER — Encounter: Payer: Self-pay | Admitting: Family Medicine

## 2022-04-08 ENCOUNTER — Other Ambulatory Visit: Payer: Self-pay | Admitting: *Deleted

## 2022-04-08 DIAGNOSIS — I10 Essential (primary) hypertension: Secondary | ICD-10-CM

## 2022-04-08 MED ORDER — AMLODIPINE BESYLATE 10 MG PO TABS: 10.0000 mg | ORAL_TABLET | Freq: Every day | ORAL | 1 refills | Status: DC

## 2022-04-08 MED ORDER — LOSARTAN POTASSIUM 100 MG PO TABS: 100.0000 mg | ORAL_TABLET | Freq: Every day | ORAL | 1 refills | Status: DC

## 2022-04-09 ENCOUNTER — Telehealth: Payer: Self-pay | Admitting: Family Medicine

## 2022-04-09 NOTE — Telephone Encounter (Signed)
Pt called in for assistance. Pt says  that her pharmacy has been out of stock of her insulin. Pt says that she has been without her insulin for 3 weeks . Pt says that she was just told by her pharmacy to reach out to her PCP for further assistance. Pt says that she also need another Free Style Meter. It's showing that her Rx expires in a few days.  ? ? ?[facility]  [id number]  [phone] ?Fax:  [phone] ?  ? ?

## 2022-04-10 ENCOUNTER — Other Ambulatory Visit: Payer: Self-pay | Admitting: Family Medicine

## 2022-04-10 DIAGNOSIS — E119 Type 2 diabetes mellitus without complications: Secondary | ICD-10-CM

## 2022-04-10 MED ORDER — FREESTYLE LIBRE 2 SENSOR MISC: 5 refills | Status: DC

## 2022-04-10 MED ORDER — OZEMPIC (1 MG/DOSE) 4 MG/3ML ~~LOC~~ SOPN: 1.0000 mg | PEN_INJECTOR | SUBCUTANEOUS | 3 refills | Status: DC

## 2022-04-10 MED ORDER — INSULIN DETEMIR 100 UNIT/ML FLEXPEN: 12.0000 [IU] | PEN_INJECTOR | Freq: Two times a day (BID) | SUBCUTANEOUS | 2 refills | Status: DC

## 2022-05-21 ENCOUNTER — Ambulatory Visit: Payer: Managed Care, Other (non HMO) | Admitting: Family Medicine

## 2022-05-27 ENCOUNTER — Emergency Department (HOSPITAL_COMMUNITY): Payer: Managed Care, Other (non HMO)

## 2022-05-27 ENCOUNTER — Ambulatory Visit
Admission: EM | Admit: 2022-05-27 | Discharge: 2022-05-27 | Disposition: A | Discharge status: Home / Self Care | Payer: Managed Care, Other (non HMO) | Attending: Family Medicine | Admitting: Family Medicine

## 2022-05-27 ENCOUNTER — Inpatient Hospital Stay (HOSPITAL_COMMUNITY)
Admission: EM | Admit: 2022-05-27 | Discharge: 2022-05-29 | DRG: 062 | Disposition: A | Discharge status: Home / Self Care | Payer: Managed Care, Other (non HMO) | Attending: Neurology | Admitting: Neurology

## 2022-05-27 ENCOUNTER — Other Ambulatory Visit: Payer: Self-pay

## 2022-05-27 ENCOUNTER — Encounter (HOSPITAL_COMMUNITY): Payer: Self-pay | Admitting: Student in an Organized Health Care Education/Training Program

## 2022-05-27 DIAGNOSIS — Z20822 Contact with and (suspected) exposure to covid-19: Secondary | ICD-10-CM | POA: Diagnosis present

## 2022-05-27 DIAGNOSIS — Z803 Family history of malignant neoplasm of breast: Secondary | ICD-10-CM

## 2022-05-27 DIAGNOSIS — R739 Hyperglycemia, unspecified: Secondary | ICD-10-CM

## 2022-05-27 DIAGNOSIS — Z794 Long term (current) use of insulin: Secondary | ICD-10-CM

## 2022-05-27 DIAGNOSIS — R200 Anesthesia of skin: Principal | ICD-10-CM

## 2022-05-27 DIAGNOSIS — E11649 Type 2 diabetes mellitus with hypoglycemia without coma: Secondary | ICD-10-CM | POA: Diagnosis not present

## 2022-05-27 DIAGNOSIS — Z6833 Body mass index (BMI) 33.0-33.9, adult: Secondary | ICD-10-CM | POA: Diagnosis not present

## 2022-05-27 DIAGNOSIS — E782 Mixed hyperlipidemia: Secondary | ICD-10-CM | POA: Diagnosis not present

## 2022-05-27 DIAGNOSIS — I639 Cerebral infarction, unspecified: Secondary | ICD-10-CM | POA: Diagnosis present

## 2022-05-27 DIAGNOSIS — F419 Anxiety disorder, unspecified: Secondary | ICD-10-CM | POA: Diagnosis present

## 2022-05-27 DIAGNOSIS — E1159 Type 2 diabetes mellitus with other circulatory complications: Secondary | ICD-10-CM | POA: Diagnosis not present

## 2022-05-27 DIAGNOSIS — G4730 Sleep apnea, unspecified: Secondary | ICD-10-CM | POA: Diagnosis present

## 2022-05-27 DIAGNOSIS — Z91148 Patient's other noncompliance with medication regimen for other reason: Secondary | ICD-10-CM | POA: Diagnosis not present

## 2022-05-27 DIAGNOSIS — E669 Obesity, unspecified: Secondary | ICD-10-CM | POA: Diagnosis present

## 2022-05-27 DIAGNOSIS — E1169 Type 2 diabetes mellitus with other specified complication: Secondary | ICD-10-CM | POA: Diagnosis not present

## 2022-05-27 DIAGNOSIS — R531 Weakness: Secondary | ICD-10-CM

## 2022-05-27 DIAGNOSIS — I152 Hypertension secondary to endocrine disorders: Secondary | ICD-10-CM | POA: Diagnosis not present

## 2022-05-27 DIAGNOSIS — G459 Transient cerebral ischemic attack, unspecified: Secondary | ICD-10-CM | POA: Diagnosis present

## 2022-05-27 DIAGNOSIS — E785 Hyperlipidemia, unspecified: Secondary | ICD-10-CM | POA: Diagnosis present

## 2022-05-27 DIAGNOSIS — E1165 Type 2 diabetes mellitus with hyperglycemia: Secondary | ICD-10-CM | POA: Diagnosis present

## 2022-05-27 DIAGNOSIS — I6389 Other cerebral infarction: Secondary | ICD-10-CM | POA: Diagnosis not present

## 2022-05-27 DIAGNOSIS — E871 Hypo-osmolality and hyponatremia: Secondary | ICD-10-CM | POA: Diagnosis present

## 2022-05-27 DIAGNOSIS — I10 Essential (primary) hypertension: Secondary | ICD-10-CM | POA: Diagnosis present

## 2022-05-27 LAB — I-STAT CHEM 8, ED
BUN: 13 mg/dL (ref 6–20)
Calcium, Ion: 1.01 mmol/L — ABNORMAL LOW (ref 1.15–1.40)
Chloride: 102 mmol/L (ref 98–111)
Creatinine, Ser: 0.6 mg/dL (ref 0.44–1.00)
Glucose, Bld: 355 mg/dL — ABNORMAL HIGH (ref 70–99)
HCT: 42 % (ref 36.0–46.0)
Hemoglobin: 14.3 g/dL (ref 12.0–15.0)
Potassium: 4 mmol/L (ref 3.5–5.1)
Sodium: 133 mmol/L — ABNORMAL LOW (ref 135–145)
TCO2: 22 mmol/L (ref 22–32)

## 2022-05-27 LAB — RESP PANEL BY RT-PCR (FLU A&B, COVID) ARPGX2
Influenza A by PCR: NEGATIVE
Influenza B by PCR: NEGATIVE
SARS Coronavirus 2 by RT PCR: NEGATIVE

## 2022-05-27 LAB — URINALYSIS, ROUTINE W REFLEX MICROSCOPIC
Bacteria, UA: NONE SEEN
Bilirubin Urine: NEGATIVE
Glucose, UA: >=500 mg/dL — AB
Hgb urine dipstick: NEGATIVE
Ketones, ur: NEGATIVE mg/dL
Leukocytes,Ua: NEGATIVE
Nitrite: NEGATIVE
Protein, ur: NEGATIVE mg/dL
Specific Gravity, Urine: 1.02 (ref 1.005–1.030)
pH: 5 (ref 5.0–8.0)

## 2022-05-27 LAB — COMPREHENSIVE METABOLIC PANEL
ALT: 18 U/L (ref 0–44)
AST: 15 U/L (ref 15–41)
Albumin: 4 g/dL (ref 3.5–5.0)
Alkaline Phosphatase: 86 U/L (ref 38–126)
Anion gap: 14 (ref 5–15)
BUN: 11 mg/dL (ref 6–20)
CO2: 22 mmol/L (ref 22–32)
Calcium: 9.3 mg/dL (ref 8.9–10.3)
Chloride: 99 mmol/L (ref 98–111)
Creatinine, Ser: 0.84 mg/dL (ref 0.44–1.00)
GFR, Estimated: >60 mL/min (ref >60)
Glucose, Bld: 354 mg/dL — ABNORMAL HIGH (ref 70–99)
Potassium: 3.8 mmol/L (ref 3.5–5.1)
Sodium: 135 mmol/L (ref 135–145)
Total Bilirubin: 0.7 mg/dL (ref 0.3–1.2)
Total Protein: 6.9 g/dL (ref 6.5–8.1)

## 2022-05-27 LAB — CBC
HCT: 40.7 % (ref 36.0–46.0)
Hemoglobin: 14.1 g/dL (ref 12.0–15.0)
MCH: 29.4 pg (ref 26.0–34.0)
MCHC: 34.6 g/dL (ref 30.0–36.0)
MCV: 84.8 fL (ref 80.0–100.0)
Platelets: 252 10*3/uL (ref 150–400)
RBC: 4.8 MIL/uL (ref 3.87–5.11)
RDW: 12.4 % (ref 11.5–15.5)
WBC: 9.6 10*3/uL (ref 4.0–10.5)
nRBC: 0 % (ref 0.0–0.2)

## 2022-05-27 LAB — DIFFERENTIAL
Abs Immature Granulocytes: 0.04 10*3/uL (ref 0.00–0.07)
Basophils Absolute: 0 10*3/uL (ref 0.0–0.1)
Basophils Relative: 0 %
Eosinophils Absolute: 0.3 10*3/uL (ref 0.0–0.5)
Eosinophils Relative: 3 %
Immature Granulocytes: 0 %
Lymphocytes Relative: 37 %
Lymphs Abs: 3.6 10*3/uL (ref 0.7–4.0)
Monocytes Absolute: 0.5 10*3/uL (ref 0.1–1.0)
Monocytes Relative: 5 %
Neutro Abs: 5.2 10*3/uL (ref 1.7–7.7)
Neutrophils Relative %: 55 %

## 2022-05-27 LAB — I-STAT BETA HCG BLOOD, ED (MC, WL, AP ONLY): I-stat hCG, quantitative: <5 m[IU]/mL (ref <5)

## 2022-05-27 LAB — MRSA NEXT GEN BY PCR, NASAL: MRSA by PCR Next Gen: NOT DETECTED

## 2022-05-27 LAB — ETHANOL: Alcohol, Ethyl (B): <10 mg/dL (ref <10)

## 2022-05-27 LAB — PROTIME-INR
INR: 1 (ref 0.8–1.2)
Prothrombin Time: 13.3 seconds (ref 11.4–15.2)

## 2022-05-27 LAB — APTT: aPTT: 28 seconds (ref 24–36)

## 2022-05-27 LAB — CBG MONITORING, ED: Glucose-Capillary: 344 mg/dL — ABNORMAL HIGH (ref 70–99)

## 2022-05-27 LAB — GLUCOSE, CAPILLARY: Glucose-Capillary: 321 mg/dL — ABNORMAL HIGH (ref 70–99)

## 2022-05-27 MED ORDER — STROKE: EARLY STAGES OF RECOVERY BOOK
Freq: Once | Status: AC
  Filled 2022-05-27: qty 1

## 2022-05-27 MED ORDER — TENECTEPLASE FOR STROKE
0.2500 mg/kg | PACK | Freq: Once | INTRAVENOUS | Status: AC
Start: 2022-05-27 — End: 2022-05-27
  Administered 2022-05-27: 23 mg via INTRAVENOUS
  Filled 2022-05-27: qty 10

## 2022-05-27 MED ORDER — INSULIN ASPART 100 UNIT/ML IJ SOLN
0.0000 [IU] | Freq: Three times a day (TID) | INTRAMUSCULAR | Status: DC
  Administered 2022-05-28: 11 [IU] via SUBCUTANEOUS
  Administered 2022-05-28: 3 [IU] via SUBCUTANEOUS
  Administered 2022-05-28: 11 [IU] via SUBCUTANEOUS
  Administered 2022-05-29: 15 [IU] via SUBCUTANEOUS

## 2022-05-27 MED ORDER — SENNOSIDES-DOCUSATE SODIUM 8.6-50 MG PO TABS
1.0000 | ORAL_TABLET | Freq: Every evening | ORAL | Status: DC | PRN
Start: 2022-05-27 — End: 2022-05-29

## 2022-05-27 MED ORDER — SODIUM CHLORIDE 0.9 % IV SOLN: INTRAVENOUS | Status: DC

## 2022-05-27 MED ORDER — ACETAMINOPHEN 650 MG RE SUPP: 650.0000 mg | RECTAL | Status: DC | PRN

## 2022-05-27 MED ORDER — PANTOPRAZOLE SODIUM 40 MG IV SOLR
40.0000 mg | Freq: Every day | INTRAVENOUS | Status: DC
  Administered 2022-05-27: 40 mg via INTRAVENOUS
  Filled 2022-05-27: qty 10

## 2022-05-27 MED ORDER — CHLORHEXIDINE GLUCONATE CLOTH 2 % EX PADS
6.0000 | MEDICATED_PAD | Freq: Every day | CUTANEOUS | Status: DC
  Administered 2022-05-27 – 2022-05-28 (×2): 6 via TOPICAL

## 2022-05-27 MED ORDER — CLEVIDIPINE BUTYRATE 0.5 MG/ML IV EMUL: 0.0000 mg/h | INTRAVENOUS | Status: DC

## 2022-05-27 MED ORDER — ACETAMINOPHEN 325 MG PO TABS: 650.0000 mg | ORAL_TABLET | ORAL | Status: DC | PRN

## 2022-05-27 MED ORDER — LABETALOL HCL 5 MG/ML IV SOLN: 20.0000 mg | Freq: Once | INTRAVENOUS | Status: DC

## 2022-05-27 MED ORDER — ACETAMINOPHEN 160 MG/5ML PO SOLN: 650.0000 mg | ORAL | Status: DC | PRN

## 2022-05-27 MED ORDER — IOHEXOL 350 MG/ML SOLN
75.0000 mL | Freq: Once | INTRAVENOUS | Status: AC | PRN
Start: 2022-05-27 — End: 2022-05-27
  Administered 2022-05-27: 75 mL via INTRAVENOUS

## 2022-05-27 NOTE — ED Provider Notes (Signed)
[name]    CSN: [id number] Arrival date & time: 05/27/22  1817      History   Chief Complaint Chief Complaint  Patient presents with   stroke?    HPI Jessica Ponce is a 56 y.o. female.   HPI Here for numbness that began in her bilateral lower face about an hour and a half ago at 5 PM.  Noted some tingling and numbness in her left hand and also in her left foot and leg.  Her left foot is weak and she feels like she had her left leg giving way and it was hard to walk when she entered our facility.  She does have hypertension and has been taking her medications which include amlodipine and losartan.  Blood pressure here is 166/105  Does have diabetes and has not been taking her insulin due to cost.  She states that she last had something to eat about 2 PM today, and she checked her sugar at home before coming here and it read high  Past Medical History:  Diagnosis Date   Allergy    Asthma    no current problems, does not use inhaler   Diabetes mellitus without complication ([place])    type 2   Essential hypertension    Hyperlipidemia     Patient Active Problem List   Diagnosis Date Noted   Type 2 diabetes mellitus with hyperglycemia ([place]) 10/07/2021   SVT (supraventricular tachycardia) ([place]) 10/07/2021   Family history of thyroid disorder 07/15/2020   Mixed hyperlipidemia 04/23/2020   Family history of breast cancer 03/11/2020   Type 2 diabetes mellitus ([place]) 03/11/2020   Hypertension, essential 07/31/2016    Past Surgical History:  Procedure Laterality Date   APPENDECTOMY  1985   exploratory laparoscopy     WISDOM TOOTH EXTRACTION      OB History   No obstetric history on file.      Home Medications    Prior to Admission medications   Medication Sig Start Date End Date Taking? Authorizing Provider  amLODipine (NORVASC) 10 MG tablet Take 1 tablet (10 mg total) by mouth daily. 04/08/22   Dorna Mai, MD  Blood Glucose Monitoring Suppl (ONE  TOUCH ULTRA MINI) w/Device KIT Used to check fasting FSBS daily. Dx: E11.9 07/15/20   Mayers, Cari S, PA-C  Continuous Blood Gluc Sensor (FREESTYLE LIBRE 2 SENSOR) MISC Utilize as directed to check blood glucose 04/10/22   Dorna Mai, MD  DM-Doxylamine-Acetaminophen (NYQUIL COLD & FLU PO) Take 2 tablets by mouth at bedtime as needed (cold symptoms).    [provider]  glucose blood (ONETOUCH ULTRA) test strip Check sugars twice daily. Dx: E11.9 01/08/21   Nicolette Bang, MD  insulin detemir (LEVEMIR) 100 UNIT/ML FlexPen Inject 12 Units into the skin 2 (two) times daily. 04/10/22   Dorna Mai, MD  Insulin Pen Needle (PEN NEEDLES) 32G X 4 MM MISC 1 application by Does not apply route daily. 10/21/21   Dorna Mai, MD  Lancets [facility] ULTRASOFT) lancets Use to check fasting FSBS daily. Dx: E11.9 07/15/20   Mayers, Cari S, PA-C  losartan (COZAAR) 100 MG tablet Take 1 tablet (100 mg total) by mouth daily. 04/08/22   Dorna Mai, MD  Semaglutide, 1 MG/DOSE, (OZEMPIC, 1 MG/DOSE,) 4 MG/3ML SOPN Inject 1 mg into the skin once a week. 04/10/22   Dorna Mai, MD  sertraline (ZOLOFT) 100 MG tablet Take 1 tablet (100 mg total) by mouth daily. 01/21/22   Dorna Mai,  MD  Vitamin D, Ergocalciferol, (DRISDOL) 1.25 MG (50000 UNIT) CAPS capsule Take 1 capsule (50,000 Units total) by mouth every 7 (seven) days. 02/12/21   Nicolette Bang, MD    Family History Family History  Problem Relation Age of Onset   Memory loss Mother    Breast cancer Mother 63   Breast cancer Maternal Aunt 74   Colon cancer Neg Hx    Rectal cancer Neg Hx    Stomach cancer Neg Hx    Esophageal cancer Neg Hx     Social History Social History   Tobacco Use   Smoking status: Never   Smokeless tobacco: Never  Vaping Use   Vaping Use: Never used  Substance Use Topics   Alcohol use: Yes    Alcohol/week: 0.0 standard drinks of alcohol    Comment: occasional wine   Drug use: No      Allergies   Valsartan, Atorvastatin, Codeine, and Metformin and related   Review of Systems Review of Systems   Physical Exam Triage Vital Signs ED Triage Vitals  Enc Vitals Group     BP 05/27/22 1823 (!) 166/105     Pulse Rate 05/27/22 1823 92     Resp 05/27/22 1823 18     Temp 05/27/22 1823 98 F (36.7 C)     Temp Source 05/27/22 1823 Oral     SpO2 05/27/22 1823 95 %     Weight --      Height --      Head Circumference --      Peak Flow --      Pain Score 05/27/22 1824 0     Pain Loc --      Pain Edu? --      Excl. in [place]? --    No data found.  Updated Vital Signs BP (!) 166/105 (BP Location: Left Arm)   Pulse 92   Temp 98 F (36.7 C) (Oral)   Resp 18   LMP 12/10/2015 (LMP Unknown)   SpO2 95%   Visual Acuity Right Eye Distance:   Left Eye Distance:   Bilateral Distance:    Right Eye Near:   Left Eye Near:    Bilateral Near:     Physical Exam Vitals reviewed.  Constitutional:      General: She is not in acute distress.    Appearance: She is not toxic-appearing.  HENT:     Mouth/Throat:     Mouth: Mucous membranes are moist.  Eyes:     Extraocular Movements: Extraocular movements intact.     Conjunctiva/sclera: Conjunctivae normal.     Pupils: Pupils are equal, round, and reactive to light.  Cardiovascular:     Rate and Rhythm: Normal rate and regular rhythm.     Heart sounds: No murmur heard. Pulmonary:     Effort: Pulmonary effort is normal. No respiratory distress.     Breath sounds: No stridor. No wheezing, rhonchi or rales.  Musculoskeletal:     Cervical back: Neck supple.  Lymphadenopathy:     Cervical: No cervical adenopathy.  Skin:    Capillary Refill: Capillary refill takes less than 2 seconds.     Coloration: Skin is not jaundiced or pale.  Neurological:     Mental Status: She is alert and oriented to person, place, and time.     Comments: I can possibly discern a very mild left facial droop.  There is no arm drift.  There is  possible some weakness of her left  foot.  Psychiatric:        Behavior: Behavior normal.      UC Treatments / Results  Labs (all labs ordered are listed, but only abnormal results are displayed) Labs Reviewed - No data to display  EKG   Radiology No results found.  Procedures Procedures (including critical care time)  Medications Ordered in UC Medications - No data to display  Initial Impression / Assessment and Plan / UC Course  I have reviewed the triage vital signs and the nursing notes.  Pertinent labs & imaging results that were available during my care of the patient were reviewed by me and considered in my medical decision making (see chart for details).     With her symptom complex, we have to send her to the emergency room for higher level of care. Final Clinical Impressions(s) / UC Diagnoses   Final diagnoses:  None   Discharge Instructions   None    ED Prescriptions   None    PDMP not reviewed this encounter.   Barrett Henle, MD 05/27/22 [phone]

## 2022-05-27 NOTE — ED Notes (Signed)
Ems contacted per provider

## 2022-05-27 NOTE — Progress Notes (Signed)
PHARMACIST CODE STROKE RESPONSE  Notified to mix TNK at 1923 by Dr. Rory Percy TNK preparation completed at 1925 Neurologist requests repeat BP prior to giving medication TNK given at 1927  TNK dose = 23 mg IV over 5 seconds  Issues/delays encountered (if applicable): n/a  Heloise Purpura 05/27/22 7:27 PM

## 2022-05-27 NOTE — ED Provider Notes (Signed)
[facility] EMER[facility]ENC[facility] DEPARTMENT Provider Note   CSN: [id number] Arrival date & time: 05/27/22  1913  An emergency department physician performed an initial assessment on this suspected stroke patient at 1.  History  Chief Complaint  Patient presents with   Code Stroke    Jessica Ponce is a 56 y.o. female.  56 year old female with past medical history of hypertension, insulin-dependent diabetes with medication non-compliance due to financial issues presents to the ED via EMS as a code stroke.  EMS reports that the patient was last known normal at 4:45 PM today.  She was sitting in her chair netting when she developed an acute onset of left-sided hemibody numbness and weakness in her left leg. Initial POC glucose in 300s, pressures with systolics in the 935T. No know history of a CVA.  The history is provided by the EMS personnel.       Home Medications Prior to Admission medications   Medication Sig Start Date End Date Taking? Authorizing Provider  amLODipine (NORVASC) 10 MG tablet Take 1 tablet (10 mg total) by mouth daily. 04/08/22   Dorna Mai, MD  Blood Glucose Monitoring Suppl (ONE TOUCH ULTRA MINI) w/Device KIT Used to check fasting FSBS daily. Dx: E11.9 07/15/20   Mayers, Cari S, PA-C  Continuous Blood Gluc Sensor (FREESTYLE LIBRE 2 SENSOR) MISC Utilize as directed to check blood glucose 04/10/22   Dorna Mai, MD  DM-Doxylamine-Acetaminophen (NYQUIL COLD & FLU PO) Take 2 tablets by mouth at bedtime as needed (cold symptoms).    [provider]  glucose blood (ONETOUCH ULTRA) test strip Check sugars twice daily. Dx: E11.9 01/08/21   Nicolette Bang, MD  insulin detemir (LEVEMIR) 100 UNIT/ML FlexPen Inject 12 Units into the skin 2 (two) times daily. 04/10/22   Dorna Mai, MD  Insulin Pen Needle (PEN NEEDLES) 32G X 4 MM MISC 1 application by Does not apply route daily. 10/21/21   Dorna Mai, MD  Lancets [facility] ULTRASOFT) lancets  Use to check fasting FSBS daily. Dx: E11.9 07/15/20   Mayers, Cari S, PA-C  losartan (COZAAR) 100 MG tablet Take 1 tablet (100 mg total) by mouth daily. 04/08/22   Dorna Mai, MD  Semaglutide, 1 MG/DOSE, (OZEMPIC, 1 MG/DOSE,) 4 MG/3ML SOPN Inject 1 mg into the skin once a week. 04/10/22   Dorna Mai, MD  sertraline (ZOLOFT) 100 MG tablet Take 1 tablet (100 mg total) by mouth daily. 01/21/22   Dorna Mai, MD  Vitamin D, Ergocalciferol, (DRISDOL) 1.25 MG (50000 UNIT) CAPS capsule Take 1 capsule (50,000 Units total) by mouth every 7 (seven) days. 02/12/21   Nicolette Bang, MD      Allergies    Valsartan, Atorvastatin, Codeine, and Metformin and related    Review of Systems   Review of Systems  Unable to perform ROS: Acuity of condition    Physical Exam Updated Vital Signs BP 139/90   Pulse 82   Temp 98.5 F (36.9 C) (Oral)   Resp 11   Wt 93 kg   LMP 12/10/2015 (LMP Unknown)   SpO2 96%   BMI 34.12 kg/m  Physical Exam Vitals and nursing note reviewed.  Constitutional:      General: She is not in acute distress.    Appearance: Normal appearance. She is well-developed. She is not ill-appearing.  HENT:     Head: Normocephalic and atraumatic.  Cardiovascular:     Rate and Rhythm: Normal rate.     Heart sounds: No murmur heard. Pulmonary:  Effort: Pulmonary effort is normal. No respiratory distress.     Breath sounds: Normal breath sounds.  Abdominal:     Palpations: Abdomen is soft.  Skin:    General: Skin is warm and dry.  Neurological:     Mental Status: She is alert.     GCS: GCS eye subscore is 4. GCS verbal subscore is 5. GCS motor subscore is 6.     Cranial Nerves: No cranial nerve deficit, dysarthria or facial asymmetry.     Sensory: Sensory deficit present.     Motor: Weakness present.     Comments: No dysarthria or facial asymmetry. No tremor. Diminished light touch sensation along the entirety of the left hemibody, including the left V1-V3  distribution. Subtle weakness of the left lower extremity.     ED Results / Procedures / Treatments   Labs (all labs ordered are listed, but only abnormal results are displayed) Labs Reviewed  COMPREHENSIVE METABOLIC PANEL - Abnormal; Notable for the following components:      Result Value   Glucose, Bld 354 (*)    All other components within normal limits  I-STAT CHEM 8, ED - Abnormal; Notable for the following components:   Sodium 133 (*)    Glucose, Bld 355 (*)    Calcium, Ion 1.01 (*)    All other components within normal limits  CBG MONITORING, ED - Abnormal; Notable for the following components:   Glucose-Capillary 344 (*)    All other components within normal limits  RESP PANEL BY RT-PCR (FLU A&B, COVID) ARPGX2  ETHANOL  PROTIME-INR  APTT  CBC  DIFFERENTIAL  RAPID URINE DRUG SCREEN, HOSP PERFORMED  URINALYSIS, ROUTINE W REFLEX MICROSCOPIC  LIPID PANEL  I-STAT BETA HCG BLOOD, ED ([facility], [facility], [facility] ONLY)    EKG None  Radiology CT HEAD CODE STROKE WO CONTRAST  Result Date: 05/27/2022 CLINICAL DATA:  Code stroke. Initial evaluation for neuro deficit, stroke suspected./ EXAM: CT HEAD WITHOUT CONTRAST TECHNIQUE: Contiguous axial images were obtained from the base of the skull through the vertex without intravenous contrast. RADIATION DOSE REDUCTION: This exam was performed according to the departmental dose-optimization program which includes automated exposure control, adjustment of the mA and/or kV according to patient size and/or use of iterative reconstruction technique. COMPARISON:  Prior MRI from 12/12/2015. FINDINGS: Brain: Cerebral volume within normal limits for patient age. No evidence for acute intracranial hemorrhage. No findings to suggest acute large vessel territory infarct. No mass lesion, midline shift, or mass effect. Ventricles are normal in size without evidence for hydrocephalus. No extra-axial fluid collection identified. Vascular: No hyperdense vessel  identified. Skull: Scalp soft tissues demonstrate no acute abnormality. Calvarium intact. Sinuses/Orbits: Globes and orbital soft tissues within normal limits. Visualized paranasal sinuses are clear. No mastoid effusion. ASPECTS [facility] Stroke Program Early CT Score) - Ganglionic level infarction (caudate, lentiform nuclei, internal capsule, insula, M1-M3 cortex): 7 - Supraganglionic infarction (M4-M6 cortex): 3 Total score (0-10 with 10 being normal): 10 IMPRESSION: 1. Normal head CT.  No acute intracranial abnormality. 2. ASPECTS is 10. These results were communicated to Dr. Rory Percy at 7:31 pm on 05/27/2022 by text page via the [facility] messaging system. Electronically Signed   By: Jeannine Boga M.D.   On: 05/27/2022 19:33    Procedures Procedures    Medications Ordered in ED Medications   stroke: early stages of recovery book (has no administration in time range)  0.9 %  sodium chloride infusion (has no administration in time range)  acetaminophen (TYLENOL)  tablet 650 mg (has no administration in time range)    Or  acetaminophen (TYLENOL) 160 MG/5ML solution 650 mg (has no administration in time range)    Or  acetaminophen (TYLENOL) suppository 650 mg (has no administration in time range)  senna-docusate (Senokot-S) tablet 1 tablet (has no administration in time range)  pantoprazole (PROTONIX) injection 40 mg (has no administration in time range)  labetalol (NORMODYNE) injection 20 mg (0 mg Intravenous Hold 05/27/22 1948)    And  clevidipine (CLEVIPREX) infusion 0.5 mg/mL (0 mg/hr Intravenous Hold 05/27/22 1949)  insulin aspart (novoLOG) injection 0-15 Units (has no administration in time range)  tenecteplase (TNKASE) injection for Stroke 23 mg (23 mg Intravenous Given 05/27/22 1927)  iohexol (OMNIPAQUE) 350 MG/ML injection 75 mL (75 mLs Intravenous Contrast Given 05/27/22 1941)    ED Course/ Medical Decision Making/ A&P                           Medical Decision Making Amount and/or  Complexity of Data Reviewed Independent Historian: EMS External Data Reviewed: notes. Labs: ordered. Radiology: ordered. ECG/medicine tests: ordered.  Risk Drug therapy requiring intensive monitoring for toxicity. Decision regarding hospitalization.   56 year old female presents to the ED as a code stroke.  On arrival, primary survey assessed and intact.  Neurology evaluated the patient on her arrival.  She is hyperglycemic in the 300s, though has poorly controlled diabetes at baseline.  I reviewed the patient's CT code stroke imaging which did not reveal an acute intracranial hemorrhage or large territory infarct.  However, given her deficits on exam, neurology opted to treat with TNK.  I also reviewed and interpreted the patient's labs which are largely reassuring.  She has no significant leukocytosis or leukopenia, no new anemia.  She is hyperglycemic at 354 but has no metabolic acidosis or elevated anion gap to support DKA at this time.  Renal function within normal limits.  Neurology states they will plan to go to the ICU for close neurologic monitoring status post TNK.  Plan of care was discussed with the patient and her family member at bedside who verbalized understanding.  No further acute events or interventions prior to transition of care to the inpatient team.  Final Clinical Impression(s) / ED Diagnoses Final diagnoses:  Numbness    Rx / DC Orders ED Discharge Orders     None         Haley Roza, [place], MD 05/27/22 2016    Teressa Lower, MD 05/28/22 [phone]

## 2022-05-27 NOTE — ED Triage Notes (Signed)
Pt c/o sitting at home when her bilat lower face went numb up to the nose area. Also states her left arm was tingling, then her left leg. Last known well just before 5p today.

## 2022-05-27 NOTE — H&P (Signed)
Neurology history and physical Status post TNKase  Patient arrival to the ER 1913 hrs.  CC: Left-sided numbness and weakness  History is obtained from: Patient, chart  HPI: Jessica Ponce is a 56 y.o. female past medical history of hypertension hyperlipidemia type 2 diabetes insulin-dependent-noncompliant to medications due to insurance issues for the past 2 months, OSA,  presented to urgent care for complaints of sudden onset of left face numbness followed by left arm and leg numbness with last known well at 4:45 PM. She was sitting watching TV and knitting a sock when she had sudden onset of numbness to her left face.  This then started to involve her left arm and left leg.  She had not been compliant to her medications because her husband is lost his job and they have no insurance.  She was on insulin-intolerant of other diabetic medications but has not been taking insulin for the last 2 months. In the urgent care clinic, she was evaluated and emergently transferred as a code stroke to the ER due to focal neurological deficits of sudden onset. Initial sugars read high but CBG in the 300s.  Initial blood pressure in the 016P systolic but in the ER between 1 53-7 50 systolic. On examination NIH 2-see details below. Risk benefits of TNKase discussed in detail. CT head with no evidence of bleed or acute processes Imaging reviewed prior to [facility] administration and TNKase administered after patient verbally consented.  On review of systems, she reports noncompliance to medications, hyperglycemia, stressors due to social situations which are more than the usual at this time.  LKW: 4:45 PM IV thrombolysis given?:  Yes at 1927 hrs.  Door to needle time-14 minutes Premorbid modified Rankin scale (mRS): 0   ROS: Full ROS was performed and is negative except as noted in the HPI.   Past Medical History:  Diagnosis Date   Allergy    Asthma    no current problems, does not use inhaler   Diabetes  mellitus without complication ([place])    type 2   Essential hypertension    Hyperlipidemia     Family History  Problem Relation Age of Onset   Memory loss Mother    Breast cancer Mother 68   Breast cancer Maternal Aunt 74   Colon cancer Neg Hx    Rectal cancer Neg Hx    Stomach cancer Neg Hx    Esophageal cancer Neg Hx     Social History:   reports that she has never smoked. She has never used smokeless tobacco. She reports current alcohol use. She reports that she does not use drugs.  Medications  Current Facility-Administered Medications:    [START ON 05/28/2022]  stroke: early stages of recovery book, , Does not apply, Once, Amie Portland, MD   0.9 %  sodium chloride infusion, , Intravenous, Continuous, Amie Portland, MD   acetaminophen (TYLENOL) tablet 650 mg, 650 mg, Oral, Q4H PRN **OR** acetaminophen (TYLENOL) 160 MG/5ML solution 650 mg, 650 mg, Per Tube, Q4H PRN **OR** acetaminophen (TYLENOL) suppository 650 mg, 650 mg, Rectal, Q4H PRN, Amie Portland, MD   labetalol (NORMODYNE) injection 20 mg, 20 mg, Intravenous, Once **AND** clevidipine (CLEVIPREX) infusion 0.5 mg/mL, 0-21 mg/hr, Intravenous, Continuous, Amie Portland, MD   pantoprazole (PROTONIX) injection 40 mg, 40 mg, Intravenous, QHS, Amie Portland, MD   senna-docusate (Senokot-S) tablet 1 tablet, 1 tablet, Oral, QHS PRN, Amie Portland, MD  Current Outpatient Medications:    amLODipine (NORVASC) 10 MG tablet, Take 1 tablet (10  mg total) by mouth daily., Disp: 90 tablet, Rfl: 1   Blood Glucose Monitoring Suppl (ONE TOUCH ULTRA MINI) w/Device KIT, Used to check fasting FSBS daily. Dx: E11.9, Disp: 1 kit, Rfl: 0   Continuous Blood Gluc Sensor (FREESTYLE LIBRE 2 SENSOR) MISC, Utilize as directed to check blood glucose, Disp: 2 each, Rfl: 5   DM-Doxylamine-Acetaminophen (NYQUIL COLD & FLU PO), Take 2 tablets by mouth at bedtime as needed (cold symptoms)., Disp: , Rfl:    glucose blood (ONETOUCH ULTRA) test strip, Check sugars  twice daily. Dx: E11.9, Disp: 100 each, Rfl: 3   insulin detemir (LEVEMIR) 100 UNIT/ML FlexPen, Inject 12 Units into the skin 2 (two) times daily., Disp: 15 mL, Rfl: 2   Insulin Pen Needle (PEN NEEDLES) 32G X 4 MM MISC, 1 application by Does not apply route daily., Disp: 100 each, Rfl: 3   Lancets (ONETOUCH ULTRASOFT) lancets, Use to check fasting FSBS daily. Dx: E11.9, Disp: 100 each, Rfl: 12   losartan (COZAAR) 100 MG tablet, Take 1 tablet (100 mg total) by mouth daily., Disp: 90 tablet, Rfl: 1   Semaglutide, 1 MG/DOSE, (OZEMPIC, 1 MG/DOSE,) 4 MG/3ML SOPN, Inject 1 mg into the skin once a week., Disp: 9 mL, Rfl: 3   sertraline (ZOLOFT) 100 MG tablet, Take 1 tablet (100 mg total) by mouth daily., Disp: 90 tablet, Rfl: 1   Vitamin D, Ergocalciferol, (DRISDOL) 1.25 MG (50000 UNIT) CAPS capsule, Take 1 capsule (50,000 Units total) by mouth every 7 (seven) days., Disp: 12 capsule, Rfl: 0  Exam: Current vital signs: BP (!) 150/86   Pulse 78   Temp 98.5 F (36.9 C) (Oral)   Wt 93 kg   LMP 12/10/2015 (LMP Unknown)   SpO2 95%   BMI 34.12 kg/m  Vital signs in last 24 hours: Temp:  [98 F (36.7 C)-98.5 F (36.9 C)] 98.5 F (36.9 C) (06/28 1947) Pulse Rate:  [77-92] 78 (06/28 1935) Resp:  [18] 18 (06/28 1823) BP: (145-166)/(86-105) 150/86 (06/28 1935) SpO2:  [94 %-97 %] 95 % (06/28 1935) Weight:  [93 kg] 93 kg (06/28 1919)  GENERAL: Awake, alert in NAD HEENT: - Normocephalic and atraumatic, dry mm, no LN++, no Thyromegally LUNGS - Clear to auscultation bilaterally with no wheezes CV - S1S2 RRR, no m/r/g, equal pulses bilaterally. ABDOMEN - Soft, nontender, nondistended with normoactive BS Ext: warm, well perfused, intact peripheral pulses, no edema  NEURO:  Mental Status: AA&Ox3  Language: speech is nondysarthric.  Naming, repetition, fluency, and comprehension intact. Cranial Nerves: PERRL EOMI, visual fields full, no facial asymmetry, facial sensation diminished on the left, hearing  intact, tongue/uvula/soft palate midline, normal sternocleidomastoid and trapezius muscle strength. No evidence of tongue atrophy or fibrillations Motor: Vertical drift left lower extremity, otherwise full strength Tone: is normal and bulk is normal Sensation-diminished on the left hemibody to all modalities Coordination: FTN intact bilaterally, difficult to check in the lower extremity on the left Gait- deferred  NIHSS-2  Labs I have reviewed labs in epic and the results pertinent to this consultation are: CBC    Component Value Date/Time   WBC 9.6 05/27/2022 1915   RBC 4.80 05/27/2022 1915   HGB 14.3 05/27/2022 1925   HGB 13.6 06/26/2021 0921   HCT 42.0 05/27/2022 1925   HCT 40.8 06/26/2021 0921   PLT 252 05/27/2022 1915   PLT 328 06/26/2021 0921   MCV 84.8 05/27/2022 1915   MCV 88 06/26/2021 0921   MCH 29.4 05/27/2022 1915   MCHC  34.6 05/27/2022 1915   RDW 12.4 05/27/2022 1915   RDW 13.0 06/26/2021 0921   LYMPHSABS 3.6 05/27/2022 1915   LYMPHSABS 3.3 (H) 06/26/2021 0921   MONOABS 0.5 05/27/2022 1915   EOSABS 0.3 05/27/2022 1915   EOSABS 0.3 06/26/2021 0921   BASOSABS 0.0 05/27/2022 1915   BASOSABS 0.1 06/26/2021 0921    CMP     Component Value Date/Time   NA 133 (L) 05/27/2022 1925   NA 140 06/26/2021 0921   K 4.0 05/27/2022 1925   CL 102 05/27/2022 1925   CO2 23 10/07/2021 0341   GLUCOSE 355 (H) 05/27/2022 1925   BUN 13 05/27/2022 1925   BUN 11 06/26/2021 0921   CREATININE 0.60 05/27/2022 1925   CALCIUM 8.5 (L) 10/07/2021 0341   PROT 7.3 10/06/2021 2139   PROT 7.3 06/26/2021 0921   ALBUMIN 4.1 10/06/2021 2139   ALBUMIN 4.5 06/26/2021 0921   AST 25 10/06/2021 2139   ALT 21 10/06/2021 2139   ALKPHOS 126 10/06/2021 2139   BILITOT 0.4 10/06/2021 2139   BILITOT 0.3 06/26/2021 0921   GFRNONAA >60 10/07/2021 0341   GFRAA 97 04/19/2020 1025     Imaging I have reviewed the images obtained:  CT-head-aspects 10.  Assessment:  56 year old with history of  diabetes, hypertension, sleep apnea noncompliant to medications due to insurance issues presenting for sudden onset of left-sided sensory loss and left lower extremity weakness with last known well at 4:45 PM. Clinical exam consistent with stroke versus stroke mimic due to hyperglycemia as well as stressors. Blood sugar levels in the mid 300s-not very likely to cause focal neurological deficits. Nonorganic causes also likely but given uncontrolled risk factors, risks and benefits of IV TNKase discussed and patient agreed for [facility] administration. CT imaging reviewed personally prior to [facility] administration. Patient admitted to the ICU for post TNK care  Impression: Acute ischemic stroke-likely lacunar Uncontrolled diabetes Hypertension Sleep apnea   Recommendations: Admit to ICU Post TNK vitals and neurochecks SCDs for DVT prophylaxis-no antiplatelets or anticoagulants until the 24-hour imaging remains negative for bleed. MRI brain without contrast at 7 PM tomorrow 2D echo CTA head and neck ordered and completed-results pending Hemoglobin A1c Lipid panel Sliding scale insulin Normal saline 75 cc an hour Blood pressure goals post TNK systolic less than 355.  Use labetalol/hydralazine as needed and if still above the goal, may need Cleviprex drip. Mild hyponatremia-likely pseudohyponatremia with hyperglycemia.  Check BMP again tomorrow. Replete electrolytes as necessary after morning labs.  No acute derangement Repeat CBC BMP in the morning  Stroke team to follow.   -- Amie Portland, MD Neurologist [facility] Pager: [phone]  CRITICAL CARE ATTESTATION Performed by: Amie Portland, MD Total critical care time: 40 minutes Critical care time was exclusive of separately billable procedures and treating other patients and/or supervising APPs/Residents/Students Critical care was necessary to treat or prevent imminent or life-threatening deterioration due to acute  ischemic stroke, IV thrombolytic administration. This patient is critically ill and at significant risk for neurological worsening and/or death and care requires constant monitoring. Critical care was time spent personally by me on the following activities: development of treatment plan with patient and/or surrogate as well as nursing, discussions with consultants, evaluation of patient's response to treatment, examination of patient, obtaining history from patient or surrogate, ordering and performing treatments and interventions, ordering and review of laboratory studies, ordering and review of radiographic studies, pulse oximetry, re-evaluation of patient's condition, participation in multidisciplinary rounds and medical decision making of  high complexity in the care of this patient.

## 2022-05-27 NOTE — ED Triage Notes (Signed)
Pt arrived with [facility] for L arm and leg numbness and decreased sensory onset at 1645 while watching TV. Pt reported numbness first started on lower face and up to numbness then arm and leg. EMS reported initial bp 194/100 and cbg reading high. NIH 2 on arrival, sensory and left leg drift. 20g IV to L ac

## 2022-05-27 NOTE — Discharge Instructions (Addendum)
You need to be evaluated in the emergency room

## 2022-05-27 NOTE — Code Documentation (Addendum)
Responded to code stroke called by EMS at 1906 for L sided weakness and aphasia. CBG 344. LKW 1645. Patient found to have L leg drift and mild numbness on left side for NIH 2. CTH neg, CTA pending. Decision made to give TNK. Administered at 1927. VSS. Plan: MRI and admit to ICU.

## 2022-05-28 ENCOUNTER — Inpatient Hospital Stay (HOSPITAL_COMMUNITY): Payer: Managed Care, Other (non HMO)

## 2022-05-28 DIAGNOSIS — I152 Hypertension secondary to endocrine disorders: Secondary | ICD-10-CM

## 2022-05-28 DIAGNOSIS — E1159 Type 2 diabetes mellitus with other circulatory complications: Secondary | ICD-10-CM

## 2022-05-28 DIAGNOSIS — E782 Mixed hyperlipidemia: Secondary | ICD-10-CM | POA: Diagnosis not present

## 2022-05-28 DIAGNOSIS — E11649 Type 2 diabetes mellitus with hypoglycemia without coma: Secondary | ICD-10-CM | POA: Diagnosis not present

## 2022-05-28 DIAGNOSIS — I10 Essential (primary) hypertension: Secondary | ICD-10-CM

## 2022-05-28 DIAGNOSIS — I6389 Other cerebral infarction: Secondary | ICD-10-CM | POA: Diagnosis not present

## 2022-05-28 DIAGNOSIS — E1169 Type 2 diabetes mellitus with other specified complication: Secondary | ICD-10-CM

## 2022-05-28 DIAGNOSIS — E669 Obesity, unspecified: Secondary | ICD-10-CM

## 2022-05-28 DIAGNOSIS — I639 Cerebral infarction, unspecified: Secondary | ICD-10-CM | POA: Diagnosis not present

## 2022-05-28 LAB — RAPID URINE DRUG SCREEN, HOSP PERFORMED
Amphetamines: NOT DETECTED
Barbiturates: NOT DETECTED
Benzodiazepines: NOT DETECTED
Cocaine: NOT DETECTED
Opiates: NOT DETECTED
Tetrahydrocannabinol: NOT DETECTED

## 2022-05-28 LAB — GLUCOSE, CAPILLARY
Glucose-Capillary: 324 mg/dL — ABNORMAL HIGH (ref 70–99)
Glucose-Capillary: 301 mg/dL — ABNORMAL HIGH (ref 70–99)
Glucose-Capillary: 186 mg/dL — ABNORMAL HIGH (ref 70–99)
Glucose-Capillary: 239 mg/dL — ABNORMAL HIGH (ref 70–99)

## 2022-05-28 LAB — ECHOCARDIOGRAM COMPLETE
AR max vel: 3.04 cm2
AV Area VTI: 2.72 cm2
AV Area mean vel: 2.79 cm2
AV Mean grad: 4 mmHg
AV Peak grad: 5.4 mmHg
Ao pk vel: 1.16 m/s
Area-P 1/2: 4.17 cm2
Calc EF: 60.3 %
Height: 65 in
S' Lateral: 2.3 cm
Single Plane A2C EF: 64.1 %
Single Plane A4C EF: 53.9 %
Weight: 3259.28 oz

## 2022-05-28 LAB — LIPID PANEL
Cholesterol: 343 mg/dL — ABNORMAL HIGH (ref 0–200)
HDL: 50 mg/dL (ref >40)
LDL Cholesterol: 237 mg/dL — ABNORMAL HIGH (ref 0–99)
Total CHOL/HDL Ratio: 6.9 RATIO
Triglycerides: 281 mg/dL — ABNORMAL HIGH (ref <150)
VLDL: 56 mg/dL — ABNORMAL HIGH (ref 0–40)

## 2022-05-28 LAB — VITAMIN B12: Vitamin B-12: 623 pg/mL (ref 180–914)

## 2022-05-28 LAB — HEMOGLOBIN A1C
Hgb A1c MFr Bld: 11.9 % — ABNORMAL HIGH (ref 4.8–5.6)
Mean Plasma Glucose: 294.83 mg/dL

## 2022-05-28 LAB — TSH: TSH: 3.713 u[IU]/mL (ref 0.350–4.500)

## 2022-05-28 MED ORDER — PANTOPRAZOLE SODIUM 40 MG PO TBEC
40.0000 mg | DELAYED_RELEASE_TABLET | Freq: Every day | ORAL | Status: DC
  Administered 2022-05-28 – 2022-05-29 (×2): 40 mg via ORAL
  Filled 2022-05-28 (×2): qty 1

## 2022-05-28 MED ORDER — ROSUVASTATIN CALCIUM 20 MG PO TABS
40.0000 mg | ORAL_TABLET | Freq: Every day | ORAL | Status: DC
  Administered 2022-05-28 – 2022-05-29 (×2): 40 mg via ORAL
  Filled 2022-05-28 (×2): qty 2

## 2022-05-28 MED ORDER — LOSARTAN POTASSIUM 50 MG PO TABS
100.0000 mg | ORAL_TABLET | Freq: Every day | ORAL | Status: DC
  Administered 2022-05-28: 100 mg via ORAL
  Filled 2022-05-28: qty 2

## 2022-05-28 MED ORDER — INSULIN ASPART PROT & ASPART (70-30 MIX) 100 UNIT/ML ~~LOC~~ SUSP
15.0000 [IU] | Freq: Two times a day (BID) | SUBCUTANEOUS | Status: DC
  Administered 2022-05-28 – 2022-05-29 (×2): 15 [IU] via SUBCUTANEOUS
  Filled 2022-05-28: qty 10

## 2022-05-28 MED ORDER — SERTRALINE HCL 50 MG PO TABS
100.0000 mg | ORAL_TABLET | Freq: Every day | ORAL | Status: DC
  Administered 2022-05-28: 100 mg via ORAL
  Filled 2022-05-28: qty 2

## 2022-05-28 MED ORDER — AMLODIPINE BESYLATE 10 MG PO TABS
10.0000 mg | ORAL_TABLET | Freq: Every day | ORAL | Status: DC
  Administered 2022-05-28: 10 mg via ORAL
  Filled 2022-05-28: qty 1

## 2022-05-28 NOTE — Evaluation (Addendum)
Occupational Therapy Evaluation Patient Details Name: Jessica Ponce MRN: [id number] DOB: 01/24/1966 Today's Date: 05/28/2022   History of Present Illness Pt is a 55 y.o. female who presented 05/27/22 with L arm, face, and leg numbness. TNKase administered. CT of head showed no acute intracranial abnormality. Negative CTA of the head and neck. PMH: DM, HTN, sleep apnea, asthma, HLD, noncompliant to meds 2/2 insurance issues   Clinical Impression   PTA, pt was living with her husband and daughter and was independent; works from home. Pt currently demonstrating near baseline function and presenting WFL for cognition, balance, strength, and FM skills. Pt able to complete grooming tasks, trail making (three part), and money management question without difficulty. Noting pt did present with twitch like shoulder shrugs on L, but inconsistent and only occurring during conversation at beginning and end of session. Recommend dc to home once medically stable per physician. All acute OT needs met and will sign off.      Recommendations for follow up therapy are one component of a multi-disciplinary discharge planning process, led by the attending physician.  Recommendations may be updated based on patient status, additional functional criteria and insurance authorization.   Follow Up Recommendations  No OT follow up    Assistance Recommended at Discharge PRN  Patient can return home with the following      Functional Status Assessment  Patient has had a recent decline in their functional status and demonstrates the ability to make significant improvements in function in a reasonable and predictable amount of time. (slight decliner)  Equipment Recommendations  None recommended by OT    Recommendations for Other Services       Precautions / Restrictions Precautions Precautions: None Restrictions Weight Bearing Restrictions: No      Mobility Bed Mobility               General bed mobility  comments: In recliner upon arrival    Transfers Overall transfer level: Independent                        Balance Overall balance assessment: No apparent balance deficits (not formally assessed)                                         ADL either performed or assessed with clinical judgement   ADL Overall ADL's : Independent                                       General ADL Comments: Pt performing ADLs and path finding task independently.     Vision Baseline Vision/History: 1 Wears glasses Patient Visual Report: No change from baseline       Perception     Praxis      Pertinent Vitals/Pain Pain Assessment Pain Assessment: Faces Faces Pain Scale: No hurt Pain Intervention(s): Monitored during session     Hand Dominance Left   Extremity/Trunk Assessment Upper Extremity Assessment Upper Extremity Assessment: LUE deficits/detail LUE Deficits / Details: Reports slight numbness stating "it feels like my arm fell asleep and it is coming back." Pt demonstrating good finger dexerity and strength as seen during (complex) knitting task, oral care, and UE coordination task. WFL strength. Noting twitch like shoulder shruggs during conversation; but not consistnent throughout session.     Lower Extremity Assessment Lower Extremity Assessment: Defer to PT evaluation LLE Deficits / Details: Very mild weakness compared to R, grossly 4+/5 on L and 5/.5 on R; very mild numbness throughout L leg; dynamic proprioception and coordination intact LLE Sensation: decreased light touch LLE Coordination: WNL   Cervical / Trunk Assessment Cervical / Trunk Assessment: Kyphotic   Communication Communication Communication: No difficulties   Cognition Arousal/Alertness: Awake/alert Behavior During Therapy: WFL for tasks assessed/performed Overall Cognitive Status: Within Functional Limits for tasks assessed                                  General Comments: Pt did present with moments of slower processing, but do not feel this is far from baseline. Requiring increased time to think of three animals that started with letter "C". But then was able to quickly adapt and perform trail making task and answer money management questions correctly. Pt able to recall three places, locate in order, and then reutrn to her room without difficulty.     General Comments  VSS on RA.    Exercises     Shoulder Instructions      Home Living Family/patient expects to be discharged to:: Private residence Living Arrangements: Spouse/significant other;Children (73 y.o. 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 daughter.        OT Problem List: Impaired sensation;Decreased strength      OT Treatment/Interventions:      OT Goals(Current goals can be found in the care plan section) Acute Rehab OT Goals Patient Stated Goal: Return home OT Goal Formulation: All assessment and education complete, DC therapy  OT Frequency:      Co-evaluation              AM-PAC OT "6 Clicks" Daily Activity     Outcome Measure Help from another person eating meals?: None Help from another person taking care of personal grooming?: None Help from another person toileting, which includes using toliet, bedpan, or urinal?: None Help from another person bathing (including washing, rinsing,  drying)?: None Help from another person to put on and taking off regular upper body clothing?: None Help from another person to put on and taking off regular lower body clothing?: None 6 Click Score: 24   End of Session Equipment Utilized During Treatment: Gait belt Nurse Communication: Mobility status  Activity Tolerance: Patient tolerated treatment well Patient left: in chair;with call bell/phone within reach  OT Visit Diagnosis: Muscle weakness (generalized) (M62.81)                Time: 6384-6659 OT Time Calculation (min): 19 min Charges:  OT General Charges $OT Visit: 1 Visit OT Evaluation $OT Eval Low Complexity: 1 Low  Jaylanni Eltringham MSOT, OTR/L Acute Rehab Office: [place] 05/28/2022, 2:58 PM

## 2022-05-28 NOTE — Progress Notes (Signed)
  Echocardiogram 2D Echocardiogram has been performed.  Jessica Ponce 05/28/2022, 3:25 PM

## 2022-05-28 NOTE — Inpatient Diabetes Management (Addendum)
Inpatient Diabetes Program Recommendations  AACE/ADA: New Consensus Statement on Inpatient Glycemic Control (2015)  Target Ranges:  Prepandial:   less than 140 mg/dL      Peak postprandial:   less than 180 mg/dL (1-2 hours)      Critically ill patients:  140 - 180 mg/dL   Lab Results  Component Value Date   GLUCAP 324 (H) 05/28/2022   HGBA1C 11.9 (H) 05/28/2022    Review of Glycemic Control  Latest Reference Range & Units 05/27/22 22:29 05/28/22 07:34  Glucose-Capillary 70 - 99 mg/dL 321 (H) 324 (H)  (H): Data is abnormally high Diabetes history: Type 2 DM Outpatient Diabetes medications: Ozempic (NT), Levemir 20 units BID (NT) Current orders for Inpatient glycemic control: Novolog 0-15 units TID  Inpatient Diabetes Program Recommendations:    Consider adding Novolog 70/30 15 units BID.   Will plan to see.   Addendum: Spoke with patient at length regarding outpatient diabetes. Patient reports husbands job loss, however, she states that she carries the insurance and has not lost coverage. There has been multiple stressors including financial barriers that have limited obtaining insulin.  Reviewed patient's current A1c of 11.9%. Explained what a A1c is and what it measures. Also reviewed goal A1c with patient, importance of good glucose control @ home, and blood sugar goals. Reviewed patho of DM, need for insulin, role of pancreas, 70/30, survival skills, interventions, when to call MD, impact on long term health, vascular changes and commorbidities.  Patient is currently wearing a [employer]. Had been working [facility] and awaiting clearance from PCP on prescriptions on insulin. At pharmacy, patient reports cost >$400, so it was taking her some time to "iron everything out". Denies drinking sugary beverages and tries at home to be mindful of CHO intake.  Feel that patient could significantly benefit from improving accessibility. Recommend Novolog 70/30 at discharge to  improve compliance until patient is able to manage barriers. TOC consult placed. Encouraged patient to reach out to PCP for appointment.  Thanks, Bronson Curb, MSN, RNC-OB Diabetes Coordinator [phone] (8a-5p)

## 2022-05-28 NOTE — Progress Notes (Addendum)
STROKE TEAM PROGRESS NOTE   INTERVAL HISTORY Patient is seen in her room with no family at the bedside.  Yesterday, she experienced acute onset left sided numbness and left leg drift.  She presented to the ED and was given TNK.  Symptoms have since improved, but some residual diminished sensation on the left side remains, along with slowed fine motor movements in the left hand (patient is left handed).  Encouraged knitting and other activities using fine motor function to improve deficits.  MRI pending, to be performed around 1900 tonight.  Of note, patient states that she has been unable to do the things she needs to due to control her diabetes and hyperlipidemia due to recent loss of health insurance.  Vitals:   05/28/22 0800 05/28/22 0900 05/28/22 1000 05/28/22 1100  BP: 139/89 131/73 122/84 (!) 157/84  Pulse: 80 85 79 81  Resp: (!) 23 15 (!) 27 (!) 23  Temp: 97.9 F (36.6 C)     TempSrc: Oral     SpO2: 93% 95% 92% 94%  Weight:      Height:       CBC:  Recent Labs  Lab 05/27/22 1915 05/27/22 1925  WBC 9.6  --   NEUTROABS 5.2  --   HGB 14.1 14.3  HCT 40.7 42.0  MCV 84.8  --   PLT 252  --    Basic Metabolic Panel:  Recent Labs  Lab 05/27/22 1915 05/27/22 1925  NA 135 133*  K 3.8 4.0  CL 99 102  CO2 22  --   GLUCOSE 354* 355*  BUN 11 13  CREATININE 0.84 0.60  CALCIUM 9.3  --    Lipid Panel:  Recent Labs  Lab 05/28/22 0522  CHOL 343*  TRIG 281*  HDL 50  CHOLHDL 6.9  VLDL 56*  LDLCALC 237*   HgbA1c:  Recent Labs  Lab 05/28/22 0522  HGBA1C 11.9*   Urine Drug Screen: No results for input(s): "LABOPIA", "COCAINSCRNUR", "LABBENZ", "AMPHETMU", "THCU", "LABBARB" in the last 168 hours.  Alcohol Level  Recent Labs  Lab 05/27/22 1918  ETH <10    IMAGING past 24 hours CT ANGIO HEAD NECK W WO CM (CODE STROKE)  Result Date: 05/27/2022 CLINICAL DATA:  Initial evaluation for acute stroke.  She EXAM: CT ANGIOGRAPHY HEAD AND NECK TECHNIQUE: Multidetector CT  imaging of the head and neck was performed using the standard protocol during bolus administration of intravenous contrast. Multiplanar CT image reconstructions and MIPs were obtained to evaluate the vascular anatomy. Carotid stenosis measurements (when applicable) are obtained utilizing NASCET criteria, using the distal internal carotid diameter as the denominator. RADIATION DOSE REDUCTION: This exam was performed according to the departmental dose-optimization program which includes automated exposure control, adjustment of the mA and/or kV according to patient size and/or use of iterative reconstruction technique. CONTRAST:  56m OMNIPAQUE IOHEXOL 350 MG/ML SOLN COMPARISON:  Prior CT from earlier the same day. FINDINGS: CTA NECK FINDINGS Aortic arch: Visualized aortic arch normal caliber with standard branching pattern. No stenosis about the origin the great vessels. Right carotid system: Right common and internal carotid arteries widely patent without stenosis, dissection or occlusion. Left carotid system: Left common and internal carotid arteries widely patent without stenosis, dissection or occlusion. Vertebral arteries: Both vertebral arteries arise from the subclavian arteries. No proximal subclavian artery stenosis. Both vertebral arteries widely patent without stenosis, dissection or occlusion. Skeleton: No discrete or worrisome osseous lesions. Two advanced multilevel cervical spondylosis at C4-5 through C6-7. Other neck:  No other acute soft tissue abnormality within the neck. Upper chest: Visualized upper chest demonstrates no acute finding. Review of the MIP images confirms the above findings CTA HEAD FINDINGS Anterior circulation: Both internal carotid arteries widely patent to the termini without stenosis. A1 segments widely patent. Normal anterior communicating artery complex. Both anterior cerebral arteries widely patent to their distal aspects without stenosis. No M1 stenosis or occlusion. Normal  MCA bifurcations. Distal MCA branches well perfused and symmetric. Posterior circulation: Both V4 segments tortuous without stenosis. Both PICA patent. Focal hyperdensity/calcification adjacent to the left V4 segment near the takeoff of the left PICA favored to reflect cord plexus (series 9, image 136). No definite aneurysm. Basilar widely patent. Superior cerebellar and posterior cerebral arteries patent bilaterally. Venous sinuses: Patent allowing for timing the contrast bolus. Anatomic variants: None significant.  No aneurysm. Review of the MIP images confirms the above findings IMPRESSION: Negative CTA of the head and neck. No large vessel occlusion, hemodynamically significant stenosis, or other acute vascular abnormality. These results were communicated to Dr. Rory Percy at 8:39 pm on 05/27/2022 by text page via the [facility] messaging system. Electronically Signed   By: Jeannine Boga M.D.   On: 05/27/2022 20:40   CT HEAD CODE STROKE WO CONTRAST  Result Date: 05/27/2022 CLINICAL DATA:  Code stroke. Initial evaluation for neuro deficit, stroke suspected./ EXAM: CT HEAD WITHOUT CONTRAST TECHNIQUE: Contiguous axial images were obtained from the base of the skull through the vertex without intravenous contrast. RADIATION DOSE REDUCTION: This exam was performed according to the departmental dose-optimization program which includes automated exposure control, adjustment of the mA and/or kV according to patient size and/or use of iterative reconstruction technique. COMPARISON:  Prior MRI from 12/12/2015. FINDINGS: Brain: Cerebral volume within normal limits for patient age. No evidence for acute intracranial hemorrhage. No findings to suggest acute large vessel territory infarct. No mass lesion, midline shift, or mass effect. Ventricles are normal in size without evidence for hydrocephalus. No extra-axial fluid collection identified. Vascular: No hyperdense vessel identified. Skull: Scalp soft tissues demonstrate no  acute abnormality. Calvarium intact. Sinuses/Orbits: Globes and orbital soft tissues within normal limits. Visualized paranasal sinuses are clear. No mastoid effusion. ASPECTS [facility] Stroke Program Early CT Score) - Ganglionic level infarction (caudate, lentiform nuclei, internal capsule, insula, M1-M3 cortex): 7 - Supraganglionic infarction (M4-M6 cortex): 3 Total score (0-10 with 10 being normal): 10 IMPRESSION: 1. Normal head CT.  No acute intracranial abnormality. 2. ASPECTS is 10. These results were communicated to Dr. Rory Percy at 7:31 pm on 05/27/2022 by text page via the [facility] messaging system. Electronically Signed   By: Jeannine Boga M.D.   On: 05/27/2022 19:33    PHYSICAL EXAM General:  Alert, well-nourished, well-developed patient in no acute distress Respiratory: Regular, unlabored respirations on room air  NEURO:  Mental Status: AA&Ox3  Speech/Language: speech is without dysarthria or aphasia.  Naming, repetition, fluency, and comprehension intact.  Cranial Nerves:  II: PERRL. Visual fields full.  III, IV, VI: EOMI. Eyelids elevate symmetrically.  V: Sensation is intact to light touch and diminished on the left  VII: Smile is symmetrical.   VIII: hearing intact to voice. IX, X: Phonation is normal.  XII: tongue is midline without fasciculations. Motor: 5/5 strength to all muscle groups tested.  Tone: is normal and bulk is normal Sensation- Intact to light touch bilaterally but diminished on the left Coordination: FTN intact bilaterally, HKS: no ataxia in BLE.No drift.  Diminished fine motor movements on the left Gait-  deferred   ASSESSMENT/PLAN Ms. Jessica Ponce is a 56 y.o. female with history of HTN, HLD, DM and OSA presenting with acute onset left sided numbness and left leg drift.  She presented to the ED and was given TNK.  Symptoms have since improved, but some residual diminished sensation on the left side remains, along with slowed fine motor movements in the left  hand (patient is left handed).  Encouraged knitting and other activities using fine motor function to improve deficits.  MRI pending, to be performed around 1900 tonight.  Of note, patient states that she has been unable to do the things she needs to due to control her diabetes and hyperlipidemia due to recent loss of health insurance.  Possible stroke s/p TNK due to uncontrolled stroke risk factors  Code Stroke CT head No acute abnormality. ASPECTS 10.    CTA head & neck No LVO or hemodynamically significant stenosis MRI  pending 2D Echo pending LDL 237 HgbA1c 11.9 VTE prophylaxis - SCDs No antithrombotic prior to admission, now on No antithrombotic as she is < 24 hours from TNK administration Therapy recommendations:  pending Disposition:  pending  Hypertension Home meds:  amlodipine 10 mg daily, losartan 100 mg daily Stable Keep BP <180/105 Long-term BP goal normotensive  Hyperlipidemia Home meds:  none LDL 237, goal < 70 Add rosuvastatin 40 mg daily  Continue statin at discharge  Diabetes type II Uncontrolled Home meds:  semaglutide 1 mg weekly, insulin detemir 12 units BID HgbA1c 11.9, goal < 7.0 Currently on 70/30 15 units BID CBGs SSI Hyperglycemia Appreciate DM coordinator assistance  Recent inability to obtain medications due to loss of health insurance [facility] consult to determine options for outpatient diabetes management  Other Stroke Risk Factors Obesity, Body mass index is 33.9 kg/m., BMI >/= 30 associated with increased stroke risk, recommend weight loss, diet and exercise as appropriate  History of migraines in young  Other Active Problems none  Hospital day # [place] , MSN, AGACNP-BC [facility] See Amion for schedule and pager information 05/28/2022 11:49 AM   ATTENDING NOTE: I reviewed above note and agree with the assessment and plan. Pt was seen and examined.   56 year old female with history of  hypertension, hyperlipidemia, diabetes, OSA, noncompliant with medication due to out of insurance admitted for left-sided numbness and left leg heaviness.  CT no acute abnormality.  Status post TNK.  CTA head and neck unremarkable.  MRI pending, 2D echo pending.  LDL 237, A1c 11.9, UDS negative.  Creatinine 0.60.  On exam, patient awake alert, orientated x3, no aphasia, follows all simple commands.  No gaze palsy, visual field full, no facial droop.  Right upper and lower extremity 5/5, left upper extremity no significant drift, however 5 -/5 for finger grip.  Left lower extremity mild drift, 4+/5.  Sensation mildly decreased on the left.  Finger-to-nose intact.  Etiology for patient symptoms not for certain, given several uncontrolled risk factors, stroke is the working diagnosis.  However, DDx including migraine equivalent, anxiety, panic attack.  Pending MRI at this time.  Put on statin for significantly elevated LDL.  Still has hyperglycemia, diabetic coordinator involved, put on insulin 70/30.  TOC also consulted for home medication management due to lack of insurance.  PT/OT no recommendation.  For detailed assessment and plan, please refer to above/below as I have made changes wherever appropriate.   Rosalin Hawking, MD PhD Stroke Neurology 05/28/2022 4:25 PM  This  patient is critically ill due to stroke like symptoms status post TNK and at significant risk of neurological worsening, death form stroke, hemorrhagic transformation, bleeding from TNK. This patient's care requires constant monitoring of vital signs, hemodynamics, respiratory and cardiac monitoring, review of multiple databases, neurological assessment, discussion with family, other specialists and medical decision making of high complexity. I spent 35 minutes of neurocritical care time in the care of this patient.   To contact Stroke Continuity provider, please refer to [email or web address] After hours, contact General Neurology

## 2022-05-28 NOTE — Evaluation (Signed)
Speech Language Pathology Evaluation Patient Details Name: Jessica Ponce MRN: [id number] DOB: Dec 04, 1965 Today's Date: 05/28/2022 Time: 3614-4315 SLP Time Calculation (min) (ACUTE ONLY): 22 min  Problem List:  Patient Active Problem List   Diagnosis Date Noted   Acute ischemic stroke ([place]) 05/27/2022   Type 2 diabetes mellitus with hyperglycemia ([place]) 10/07/2021   SVT (supraventricular tachycardia) ([place]) 10/07/2021   Family history of thyroid disorder 07/15/2020   Mixed hyperlipidemia 04/23/2020   Family history of breast cancer 03/11/2020   Type 2 diabetes mellitus ([place]) 03/11/2020   Hypertension, essential 07/31/2016   Past Medical History:  Past Medical History:  Diagnosis Date   Allergy    Asthma    no current problems, does not use inhaler   Diabetes mellitus without complication ([place])    type 2   Essential hypertension    Hyperlipidemia    Past Surgical History:  Past Surgical History:  Procedure Laterality Date   APPENDECTOMY  1985   exploratory laparoscopy     WISDOM TOOTH EXTRACTION     HPI:  Pt is a 56 year old female who presented to the ED as a code stroke after an acute onset of left-sided hemibody numbness and weakness in her left leg. TNK administered. CT head normal; MRI pending at time of evaluation. PMH: hypertension, insulin-dependent diabetes with medication non-compliance due to financial issues   Assessment / Plan / Recommendation Clinical Impression  Pt participated in speech-language-cognition evaluation evaluation. Pt reported that she has a bachelor's degree and that she works full time doing Therapist, art. She denied any baseline or acute deficits in speech, language, or cognition. The [facility] Mental Status Examination was completed to evaluate the pt's cognitive-linguistic skills. She achieved a score of 29/30 which is within the normal limits of 27 or more out of 30. No speech or language deficits were noted and her performance on  informal cognitive-linguistic tasks was within functional limits. Further skilled SLP services are not clinically indicated at this time. Pt and nursing were educated regarding this and both parties verbalized understanding as well as agreement with plan of care.    SLP Assessment  SLP Recommendation/Assessment: Patient does not need any further Speech [facility] SLP Visit Diagnosis: Cognitive communication deficit (R41.841)    Recommendations for follow up therapy are one component of a multi-disciplinary discharge planning process, led by the attending physician.  Recommendations may be updated based on patient status, additional functional criteria and insurance authorization.    Follow Up Recommendations  No SLP follow up    Assistance Recommended at Discharge  None  Functional Status Assessment Patient has not had a recent decline in their functional status  Frequency and Duration           SLP Evaluation Cognition  Overall Cognitive Status: Within Functional Limits for tasks assessed Arousal/Alertness: Awake/alert Orientation Level: Oriented X4 Year: 2023 Month: June Day of Week: Correct Attention: Focused;Sustained;Selective Focused Attention: Appears intact Sustained Attention: Appears intact Selective Attention: Appears intact Memory: Appears intact (Immediate: 5/5; delayed: 5/5) Awareness: Appears intact Problem Solving:  (Money: 3/3)       Comprehension  Auditory Comprehension Overall Auditory Comprehension: Appears within functional limits for tasks assessed Yes/No Questions: Within Functional Limits Commands: Within Functional Limits Conversation: Complex    Expression Verbal Expression Overall Verbal Expression: Appears within functional limits for tasks assessed Initiation: No impairment Level of Generative/Spontaneous Verbalization: Conversation Repetition: No impairment Naming: No impairment Pragmatics: No impairment   Oral / Motor   Oral  Motor/Sensory Function Overall Oral Motor/Sensory Function: Within functional limits Motor Speech Overall Motor Speech: Appears within functional limits for tasks assessed Respiration: Within functional limits Phonation: Normal Resonance: Within functional limits Articulation: Within functional limitis Intelligibility: Intelligible Motor Planning: Witnin functional limits Motor Speech Errors: Not applicable           Bryannah [place] I. Hardin Negus, [place], [place] Office number [phone] Pager [place] 05/28/2022, 11:04 AM

## 2022-05-28 NOTE — Evaluation (Signed)
Physical Therapy Evaluation Patient Details Name: Jessica Ponce MRN: [id number] DOB: January 20, 1966 Today's Date: 05/28/2022  History of Present Illness  Pt is a 56 y.o. female who presented 05/27/22 with L arm, face, and leg numbness. TNKase administered. CT of head showed no acute intracranial abnormality. Negative CTA of the head and neck. PMH: DM, HTN, sleep apnea, asthma, HLD, noncompliant to meds 2/2 insurance issues   Clinical Impression  Pt presents with condition above and deficits mentioned below, see PT Problem List. PTA, she was IND without DME, working, and living with her family in a 1-level house with 1 STE. Currently, pt displays some mild L-sided weakness and sensory deficits to light touch. However, she is able to perform all bed mobility, transfers, and gait without UE support or LOB independently. She did display x1 LOB when her L knee buckled descending stairs, needing minA to recover. She scored a 22/24 on the DGI today. A score of > 22/24 is indicative of a safe ambulator. She does not appear to be at risk for falls with mobility on level surfaces, but has mild instability on stairs. PT will plan to follow-up with pt for likely just 1 additional session to ensure her stability/safety improves on stairs and a HEP is provided if needed. I anticipate she will progress quickly though.     Recommendations for follow up therapy are one component of a multi-disciplinary discharge planning process, led by the attending physician.  Recommendations may be updated based on patient status, additional functional criteria and insurance authorization.  Follow Up Recommendations No PT follow up      Assistance Recommended at Discharge PRN  Patient can return home with the following  Help with stairs or ramp for entrance;Assistance with cooking/housework    Equipment Recommendations None recommended by PT  Recommendations for Other Services       Functional Status Assessment Patient has had  a recent decline in their functional status and demonstrates the ability to make significant improvements in function in a reasonable and predictable amount of time.     Precautions / Restrictions Precautions Precautions: None Restrictions Weight Bearing Restrictions: No      Mobility  Bed Mobility Overal bed mobility: Independent             General bed mobility comments: Pt able to transition supine > sit L EOB with bed flat without rails without assistance.    Transfers Overall transfer level: Independent Equipment used: None               General transfer comment: Able to come to stand without LOB or need for assistance.    Ambulation/Gait Ambulation/Gait assistance: Independent, Supervision Gait Distance (Feet): 400 Feet Assistive device: None Gait Pattern/deviations: WFL(Within Functional Limits) Gait velocity: WNL Gait velocity interpretation: >4.37 ft/sec, indicative of normal walking speed   General Gait Details: Pt with slightly slow gait initially but able to quickly progress to increased gait speed. Supervision initially for safety, but quickly progressed to IND level. No LOB with DGI challenges, except on stairs. No significant gait deviations noted.  Stairs Stairs: Yes Stairs assistance: Min guard, Min assist Stair Management: No rails, Alternating pattern, Forwards Number of Stairs: 3 General stair comments: Ascends slowly with reciprocal pattern, no UE support, no LOB, and min guard assist. Extra effort noted to ascend leading with L leg. When descending leading down with L leg pt had L knee buckle 1x when accepting weight to step down with R, minA to recover.  Wheelchair  Mobility    Modified Rankin (Stroke Patients Only) Modified Rankin (Stroke Patients Only) Pre-Morbid Rankin Score: No symptoms Modified Rankin: No significant disability     Balance Overall balance assessment: Mild deficits observed, not formally tested (on stairs only)                                Standardized Balance Assessment Standardized Balance Assessment : Dynamic Gait Index   Dynamic Gait Index Level Surface: Normal Change in Gait Speed: Normal Gait with Horizontal Head Turns: Normal Gait with Vertical Head Turns: Normal Gait and Pivot Turn: Normal Step Over Obstacle: Mild Impairment Step Around Obstacles: Normal Steps: Mild Impairment Total Score: 22       Pertinent Vitals/Pain Pain Assessment Pain Assessment: Faces Faces Pain Scale: Hurts a little bit Pain Location: back (chronic); L leg with mobility Pain Descriptors / Indicators: Discomfort, Tightness, Sore, Guarding Pain Intervention(s): Limited activity within patient's tolerance, Monitored during session, Repositioned    Home Living Family/patient expects to be discharged to:: Private residence Living Arrangements: Spouse/significant other;Children (58 y.o. 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 daughter.     Hand Dominance   Dominant Hand: Left    Extremity/Trunk Assessment   Upper Extremity Assessment Upper Extremity Assessment: Defer to OT evaluation (L UE slightly numb to light touch and with very mild weakness compared to R; no dysmetria or dysdiadochokinesia noted) LUE Deficits / Details: L UE slightly numb to light touch and with very mild weakness compared to R; no dysmetria or dysdiadochokinesia noted    Lower Extremity Assessment Lower Extremity Assessment: LLE deficits/detail LLE Deficits / Details:  Very mild weakness compared to R, grossly 4+/5 on L and 5/.5 on R; very mild numbness throughout L leg; dynamic proprioception and coordination intact LLE Sensation: decreased light touch LLE Coordination: WNL    Cervical / Trunk Assessment Cervical / Trunk Assessment: Kyphotic  Communication   Communication: No difficulties  Cognition Arousal/Alertness: Awake/alert Behavior During Therapy: WFL for tasks assessed/performed Overall Cognitive Status: No family/caregiver present to determine baseline cognitive functioning                                 General Comments: Pt A&Ox4, but took a while to answer her name and DOB, initially stating her year of birth was "[birth year] and" but then caught herself. Pt with increased time expressing herself occasionally. Unsure if this is baseline or due to stressors at home impacting her train fo thought.        General Comments General comments (skin integrity, edema, etc.): VSS on RA    Exercises     Assessment/Plan    PT Assessment Patient needs continued PT services  PT Problem List Decreased strength;Decreased balance;Decreased mobility;Impaired sensation       PT Treatment Interventions DME instruction;Gait training;Stair training;Functional mobility training;Therapeutic activities;Therapeutic exercise;Balance training;Neuromuscular re-education;Patient/family education    PT Goals (Current goals can be found  in the Care Plan section)  Acute Rehab PT Goals Patient Stated Goal: to get back to work PT Goal Formulation: With patient Time For Goal Achievement: 06/04/22 Potential to Achieve Goals: Good    Frequency Min 2X/week     Co-evaluation               AM-PAC PT "6 Clicks" Mobility  Outcome Measure Help needed turning from your back to your side while in a flat bed without using bedrails?: None Help needed moving from lying on your back to sitting on the side of a flat bed without using bedrails?:  None Help needed moving to and from a bed to a chair (including a wheelchair)?: None Help needed standing up from a chair using your arms (e.g., wheelchair or bedside chair)?: None Help needed to walk in hospital room?: None Help needed climbing 3-5 steps with a railing? : A Little 6 Click Score: 23    End of Session Equipment Utilized During Treatment: Gait belt Activity Tolerance: Patient tolerated treatment well Patient left: in chair;with call bell/phone within reach Nurse Communication: Mobility status PT Visit Diagnosis: Unsteadiness on feet (R26.81);Muscle weakness (generalized) (M62.81)    Time: 1173-5670 PT Time Calculation (min) (ACUTE ONLY): 32 min   Charges:   PT Evaluation $PT Eval Low Complexity: 1 Low PT Treatments $Gait Training: 8-22 mins        Moishe Spice, PT, DPT Acute Rehabilitation Services  Office: [phone]   Orvan Falconer 05/28/2022, 12:42 PM

## 2022-05-28 NOTE — TOC Initial Note (Signed)
Transition of Care [facility]) - Initial/Assessment Note    Patient Details  Name: Jessica Ponce MRN: [id number] Date of Birth: 04-22-1966  Transition of Care [facility]) CM/SW Contact:    Ella Bodo, RN Phone Number: 05/28/2022, 3:30pm  Clinical Narrative:                 Pt is a 56 y.o. female who presented 05/27/22 with L arm, face, and leg numbness. TNK administered. CT of head showed no acute intracranial abnormality. Negative CTA of the head and neck. PTA, pt independent and living at home with spouse and teenage daughter. TOC consult for medication assistance, as patient reported having difficulty affording meds.   Met with patient; she states that her husband has recently lost his job,and is currently looking for work.  Patient works from home for [employer], and has medical and Rx coverage through her employer.  Patient was taking Ozempic, which her insurance stopped paying for; the price without insurance was >$400, which patient could not afford.  We discussed Diabetes Coordinator's recommendation to change insulin to Novolog 70/30, and patient is agreeable to this plan.  She states that the rest of her meds are affordable, and she needs no financial assistance with them. Freestyle Libre costs her $37/month, which she states is also affordable.  Patient currently has insurance and has not lost Rx coverage.  She states she will be able to afford all medications, now that she is off the Ozempic.  She agrees to prompt PCP follow up with Dorna Mai, MD post discharge; [facility] will schedule appointment.   Expected Discharge Plan: Home/Self Care Barriers to Discharge: Continued Medical Work up   Patient Goals and CMS Choice Patient states their goals for this hospitalization and ongoing recovery are:: to go home      Expected Discharge Plan and Services Expected Discharge Plan: Home/Self Care   Discharge Planning Services: CM Consult, Medication Assistance, Follow-up appt scheduled                                           Prior Living Arrangements/Services   Lives with:: Spouse, Minor Children Patient language and need for interpreter reviewed:: Yes Do you feel safe going back to the place where you live?: Yes      Need for Family Participation in Patient Care: Yes (Comment) Care giver support system in place?: Yes (comment)   Criminal Activity/Legal Involvement Pertinent to Current Situation/Hospitalization: No - Comment as needed  Activities of Daily Living Home Assistive Devices/Equipment: Eyeglasses, CPAP ADL Screening (condition at time of admission) Patient's cognitive ability adequate to safely complete daily activities?: Yes Is the patient deaf or have difficulty hearing?: No Does the patient have difficulty seeing, even when wearing glasses/contacts?: No Does the patient have difficulty concentrating, remembering, or making decisions?: No Patient able to express need for assistance with ADLs?: Yes Does the patient have difficulty dressing or bathing?: No Independently performs ADLs?: No Communication: Independent Dressing (OT): Independent Grooming: Independent Feeding: Independent Bathing: Needs assistance Is this a change from baseline?: Change from baseline, expected to last <3 days Toileting: Needs assistance Is this a change from baseline?: Change from baseline, expected to last <3 days In/Out Bed: Needs assistance Is this a change from baseline?: Change from baseline, expected to last <3 days Walks in Home: Needs assistance Is this a change from baseline?: Change from baseline, expected to last <  3 days Does the patient have difficulty walking or climbing stairs?: Yes Weakness of Legs: Left Weakness of Arms/Hands: None                   Emotional Assessment Appearance:: Appears stated age Attitude/Demeanor/Rapport: Engaged Affect (typically observed): Accepting Orientation: : Oriented to Self, Oriented to Place, Oriented to  Time,  Oriented to Situation      Admission diagnosis:  Numbness [R20.0] Acute ischemic stroke [facility]) [I63.9] Patient Active Problem List   Diagnosis Date Noted   Acute ischemic stroke ([place]) 05/27/2022   Type 2 diabetes mellitus with hyperglycemia ([place]) 10/07/2021   SVT (supraventricular tachycardia) ([place]) 10/07/2021   Family history of thyroid disorder 07/15/2020   Mixed hyperlipidemia 04/23/2020   Family history of breast cancer 03/11/2020   Type 2 diabetes mellitus ([place]) 03/11/2020   Hypertension, essential 07/31/2016   PCP:  Dorna Mai, MD Pharmacy:   [id number]: [phone]: [phone]    Social Determinants of Health (SDOH) Interventions    Readmission Risk Interventions     No data to display         JReinaldo Raddle RN, BSN  Trauma/Neuro ICU Case Manager [phone]

## 2022-05-29 ENCOUNTER — Other Ambulatory Visit (HOSPITAL_COMMUNITY): Payer: Self-pay

## 2022-05-29 ENCOUNTER — Encounter: Payer: Self-pay | Admitting: Neurology

## 2022-05-29 ENCOUNTER — Telehealth: Payer: Self-pay | Admitting: Family Medicine

## 2022-05-29 DIAGNOSIS — E11649 Type 2 diabetes mellitus with hypoglycemia without coma: Secondary | ICD-10-CM | POA: Diagnosis not present

## 2022-05-29 DIAGNOSIS — R200 Anesthesia of skin: Secondary | ICD-10-CM

## 2022-05-29 DIAGNOSIS — G459 Transient cerebral ischemic attack, unspecified: Secondary | ICD-10-CM

## 2022-05-29 DIAGNOSIS — F419 Anxiety disorder, unspecified: Secondary | ICD-10-CM

## 2022-05-29 LAB — GLUCOSE, CAPILLARY: Glucose-Capillary: 351 mg/dL — ABNORMAL HIGH (ref 70–99)

## 2022-05-29 MED ORDER — CLOPIDOGREL BISULFATE 75 MG PO TABS
75.0000 mg | ORAL_TABLET | Freq: Every day | ORAL | 1 refills | Status: DC
  Filled 2022-05-29: qty 30, 30d supply, fill #0

## 2022-05-29 MED ORDER — INSULIN PEN NEEDLE 32G X 4 MM MISC
0 refills | Status: DC
  Filled 2022-05-29: qty 100, 30d supply, fill #0

## 2022-05-29 MED ORDER — INSULIN LISPRO PROT & LISPRO (75-25 MIX) 100 UNIT/ML KWIKPEN
20.0000 [IU] | PEN_INJECTOR | Freq: Two times a day (BID) | SUBCUTANEOUS | 11 refills | Status: DC
  Filled 2022-05-29: qty 12, 30d supply, fill #0

## 2022-05-29 MED ORDER — CLOPIDOGREL BISULFATE 75 MG PO TABS
75.0000 mg | ORAL_TABLET | Freq: Every day | ORAL | Status: DC
  Administered 2022-05-29: 75 mg via ORAL
  Filled 2022-05-29: qty 1

## 2022-05-29 MED ORDER — ASPIRIN 81 MG PO TBEC
81.0000 mg | DELAYED_RELEASE_TABLET | Freq: Every day | ORAL | Status: DC
  Administered 2022-05-29: 81 mg via ORAL
  Filled 2022-05-29: qty 1

## 2022-05-29 MED ORDER — ROSUVASTATIN CALCIUM 40 MG PO TABS
40.0000 mg | ORAL_TABLET | Freq: Every day | ORAL | 1 refills | Status: DC
  Filled 2022-05-29: qty 30, 30d supply, fill #0

## 2022-05-29 MED ORDER — ASPIRIN 81 MG PO TBEC
81.0000 mg | DELAYED_RELEASE_TABLET | Freq: Every day | ORAL | 12 refills | Status: AC
  Filled 2022-05-29: qty 30, 30d supply, fill #0

## 2022-05-29 MED ORDER — INSULIN ASPART PROT & ASPART (70-30 MIX) 100 UNIT/ML ~~LOC~~ SUSP
20.0000 [IU] | Freq: Two times a day (BID) | SUBCUTANEOUS | Status: DC
Start: 2022-05-29 — End: 2022-05-29
  Filled 2022-05-29: qty 10

## 2022-05-29 NOTE — Telephone Encounter (Signed)
Dora calling [facility] is calling to request a hospital follow up within 7-10 days.  CB- [phone]

## 2022-05-29 NOTE — Discharge Summary (Addendum)
Stroke Discharge Summary  Patient ID: Jessica Ponce   MRN: [id number]      DOB: 1966/07/22  Date of Admission: 05/27/2022 Date of Discharge: 05/29/2022  Attending Physician:  Rosalin Hawking MD Consultant(s):    None Patient's PCP:  Dorna Mai, MD  DISCHARGE DIAGNOSIS:   TIA vs. Anxiety s/p TNK due to stroke like symptoms and uncontrolled stroke risk factors  Secondary Problem: HTN HLD Uncontrolled DM Obesity Hx of migraine   Allergies as of 05/29/2022       Reactions   Valsartan Diarrhea   Atorvastatin Other (See Comments)   Elevated sugars   Codeine Nausea And Vomiting   Metformin And Related Diarrhea        Medication List     STOP taking these medications    insulin detemir 100 UNIT/ML FlexPen Commonly known as: LEVEMIR   Ozempic (1 MG/DOSE) 4 MG/3ML Sopn Generic drug: Semaglutide (1 MG/DOSE)       TAKE these medications    amLODipine 10 MG tablet Commonly known as: NORVASC Take 1 tablet (10 mg total) by mouth daily.   aspirin EC 81 MG tablet Take 1 tablet (81 mg total) by mouth daily. Swallow whole. Start taking on: May 30, 2022   clopidogrel 75 MG tablet Commonly known as: PLAVIX Take 1 tablet (75 mg total) by mouth daily. Start taking on: May 30, 2022   FreeStyle [place] 2 Sensor Misc Utilize as directed to check blood glucose   HumaLOG Mix 75/25 (75-25) 100 UNIT/ML Susp injection Generic drug: insulin lispro protamine-lispro Inject 20 Units into the skin 2 (two) times daily with a meal.   losartan 100 MG tablet Commonly known as: COZAAR Take 1 tablet (100 mg total) by mouth daily.   NYQUIL COLD & FLU PO Take 2 tablets by mouth at bedtime as needed (cold symptoms).   ONE TOUCH ULTRA MINI w/Device Kit Used to check fasting FSBS daily. Dx: E11.9   OneTouch Ultra test strip Generic drug: glucose blood Check sugars twice daily. Dx: E11.9   onetouch ultrasoft lancets Use to check fasting FSBS daily. Dx: E11.9   Pen Needles 32G X 4  MM Misc 1 application by Does not apply route daily.   rosuvastatin 40 MG tablet Commonly known as: CRESTOR Take 1 tablet (40 mg total) by mouth daily. Start taking on: May 30, 2022   sertraline 100 MG tablet Commonly known as: ZOLOFT Take 1 tablet (100 mg total) by mouth daily.   Vitamin D (Ergocalciferol) 1.25 MG (50000 UNIT) Caps capsule Commonly known as: DRISDOL Take 1 capsule (50,000 Units total) by mouth every 7 (seven) days.        LABORATORY STUDIES CBC    Component Value Date/Time   WBC 9.6 05/27/2022 1915   RBC 4.80 05/27/2022 1915   HGB 14.3 05/27/2022 1925   HGB 13.6 06/26/2021 0921   HCT 42.0 05/27/2022 1925   HCT 40.8 06/26/2021 0921   PLT 252 05/27/2022 1915   PLT 328 06/26/2021 0921   MCV 84.8 05/27/2022 1915   MCV 88 06/26/2021 0921   MCH 29.4 05/27/2022 1915   MCHC 34.6 05/27/2022 1915   RDW 12.4 05/27/2022 1915   RDW 13.0 06/26/2021 0921   LYMPHSABS 3.6 05/27/2022 1915   LYMPHSABS 3.3 (H) 06/26/2021 0921   MONOABS 0.5 05/27/2022 1915   EOSABS 0.3 05/27/2022 1915   EOSABS 0.3 06/26/2021 0921   BASOSABS 0.0 05/27/2022 1915   BASOSABS 0.1 06/26/2021 0921   CMP  Component Value Date/Time   NA 133 (L) 05/27/2022 1925   NA 140 06/26/2021 0921   K 4.0 05/27/2022 1925   CL 102 05/27/2022 1925   CO2 22 05/27/2022 1915   GLUCOSE 355 (H) 05/27/2022 1925   BUN 13 05/27/2022 1925   BUN 11 06/26/2021 0921   CREATININE 0.60 05/27/2022 1925   CALCIUM 9.3 05/27/2022 1915   PROT 6.9 05/27/2022 1915   PROT 7.3 06/26/2021 0921   ALBUMIN 4.0 05/27/2022 1915   ALBUMIN 4.5 06/26/2021 0921   AST 15 05/27/2022 1915   ALT 18 05/27/2022 1915   ALKPHOS 86 05/27/2022 1915   BILITOT 0.7 05/27/2022 1915   BILITOT 0.3 06/26/2021 0921   GFRNONAA >60 05/27/2022 1915   GFRAA 97 04/19/2020 1025   COAGS Lab Results  Component Value Date   INR 1.0 05/27/2022   INR 0.96 12/12/2015   Lipid Panel    Component Value Date/Time   CHOL 343 (H) 05/28/2022 0522    CHOL 278 (H) 06/26/2021 0921   TRIG 281 (H) 05/28/2022 0522   HDL 50 05/28/2022 0522   HDL 58 06/26/2021 0921   CHOLHDL 6.9 05/28/2022 0522   VLDL 56 (H) 05/28/2022 0522   LDLCALC 237 (H) 05/28/2022 0522   LDLCALC 186 (H) 06/26/2021 0921   HgbA1C  Lab Results  Component Value Date   HGBA1C 11.9 (H) 05/28/2022   Urinalysis    Component Value Date/Time   COLORURINE STRAW (A) 05/27/2022 2015   APPEARANCEUR CLEAR 05/27/2022 2015   LABSPEC 1.020 05/27/2022 2015   PHURINE 5.0 05/27/2022 2015   GLUCOSEU >=500 (A) 05/27/2022 2015   HGBUR NEGATIVE 05/27/2022 2015   BILIRUBINUR NEGATIVE 05/27/2022 2015   KETONESUR NEGATIVE 05/27/2022 2015   PROTEINUR NEGATIVE 05/27/2022 2015   UROBILINOGEN 0.2 10/03/2007 2219   NITRITE NEGATIVE 05/27/2022 2015   LEUKOCYTESUR NEGATIVE 05/27/2022 2015   Urine Drug Screen     Component Value Date/Time   LABOPIA NONE DETECTED 05/28/2022 1145   COCAINSCRNUR NONE DETECTED 05/28/2022 1145   LABBENZ NONE DETECTED 05/28/2022 1145   AMPHETMU NONE DETECTED 05/28/2022 1145   THCU NONE DETECTED 05/28/2022 1145   LABBARB NONE DETECTED 05/28/2022 1145    Alcohol Level    Component Value Date/Time   ETH <10 05/27/2022 1918     SIGNIFICANT DIAGNOSTIC STUDIES MR BRAIN WO CONTRAST  Result Date: 05/28/2022 CLINICAL DATA:  Stroke follow-up EXAM: MRI HEAD WITHOUT CONTRAST TECHNIQUE: Multiplanar, multiecho pulse sequences of the brain and surrounding structures were obtained without intravenous contrast. COMPARISON:  CT angio head neck 05/27/2022 FINDINGS: Brain: Negative for acute infarct. Ventricle size and cerebral volume normal. No significant chronic ischemia. Normal white matter. Negative for hemorrhage or mass. Vascular: Normal arterial flow voids. Skull and upper cervical spine: Negative Sinuses/Orbits: Paranasal sinuses clear.  Negative orbit Other: None IMPRESSION: Negative MRI head Electronically Signed   By: Franchot Gallo M.D.   On: 05/28/2022 19:47    ECHOCARDIOGRAM COMPLETE  Result Date: 05/28/2022    ECHOCARDIOGRAM REPORT   Patient Name:   HAILE TOPPINS Date of Exam: 05/28/2022 Medical Rec #:  [id number]   Height:       65.0 in Accession #:    [id number]  Weight:       203.7 lb Date of Birth:  [birth year]   BSA:          1.994 m Patient Age:    56 years    BP:           126/83  mmHg Patient Gender: F           HR:           73 bpm. Exam Location:  Inpatient Procedure: 2D Echo, Cardiac Doppler and Color Doppler Indications:    Stroke  History:        Patient has prior history of Echocardiogram examinations, most                 recent 10/07/2021. Risk Factors:Hypertension, Diabetes and HLD.  Sonographer:    Joette Catching RCS Referring Phys: [id number] ASHISH ARORA IMPRESSIONS  1. Left ventricular ejection fraction, by estimation, is 55 to 60%. The left ventricle has normal function. The left ventricle has no regional wall motion abnormalities. Left ventricular diastolic parameters are consistent with Grade I diastolic dysfunction (impaired relaxation).  2. Right ventricular systolic function is normal. The right ventricular size is normal. Tricuspid regurgitation signal is inadequate for assessing PA pressure.  3. The mitral valve is normal in structure. Trivial mitral valve regurgitation. No evidence of mitral stenosis.  4. The aortic valve is tricuspid. Aortic valve regurgitation is not visualized. Aortic valve sclerosis is present, with no evidence of aortic valve stenosis. Aortic valve area, by VTI measures 2.72 cm. Aortic valve mean gradient measures 4.0 mmHg. Aortic valve Vmax measures 1.16 m/s.  5. Aortic dilatation noted. There is borderline dilatation of the ascending aorta, measuring 37 mm.  6. The inferior vena cava is normal in size with greater than 50% respiratory variability, suggesting right atrial pressure of 3 mmHg. FINDINGS  Left Ventricle: Left ventricular ejection fraction, by estimation, is 55 to 60%. The left ventricle has normal function.  The left ventricle has no regional wall motion abnormalities. The left ventricular internal cavity size was normal in size. There is  no left ventricular hypertrophy. Left ventricular diastolic parameters are consistent with Grade I diastolic dysfunction (impaired relaxation). Normal left ventricular filling pressure. Right Ventricle: The right ventricular size is normal. No increase in right ventricular wall thickness. Right ventricular systolic function is normal. Tricuspid regurgitation signal is inadequate for assessing PA pressure. Left Atrium: Left atrial size was normal in size. Right Atrium: Right atrial size was normal in size. Pericardium: There is no evidence of pericardial effusion. Mitral Valve: The mitral valve is normal in structure. Mild mitral annular calcification. Trivial mitral valve regurgitation. No evidence of mitral valve stenosis. Tricuspid Valve: The tricuspid valve is normal in structure. Tricuspid valve regurgitation is trivial. No evidence of tricuspid stenosis. Aortic Valve: The aortic valve is tricuspid. Aortic valve regurgitation is not visualized. Aortic valve sclerosis is present, with no evidence of aortic valve stenosis. Aortic valve mean gradient measures 4.0 mmHg. Aortic valve peak gradient measures 5.4  mmHg. Aortic valve area, by VTI measures 2.72 cm. Pulmonic Valve: The pulmonic valve was normal in structure. Pulmonic valve regurgitation is not visualized. No evidence of pulmonic stenosis. Aorta: Aortic dilatation noted. There is borderline dilatation of the ascending aorta, measuring 37 mm. Venous: The inferior vena cava is normal in size with greater than 50% respiratory variability, suggesting right atrial pressure of 3 mmHg. IAS/Shunts: No atrial level shunt detected by color flow Doppler.  LEFT VENTRICLE PLAX 2D LVIDd:         4.00 cm     Diastology LVIDs:         2.30 cm     LV e' medial:    6.97 cm/s LV PW:         0.80 cm  LV E/e' medial:  13.0 LV IVS:        0.80  cm     LV e' lateral:   7.40 cm/s LVOT diam:     2.20 cm     LV E/e' lateral: 12.3 LV SV:         69 LV SV Index:   35 LVOT Area:     3.80 cm  LV Volumes (MOD) LV vol d, MOD A2C: 61.8 ml LV vol d, MOD A4C: 50.3 ml LV vol s, MOD A2C: 22.2 ml LV vol s, MOD A4C: 23.2 ml LV SV MOD A2C:     39.6 ml LV SV MOD A4C:     50.3 ml LV SV MOD BP:      34.4 ml RIGHT VENTRICLE             IVC RV Basal diam:  2.70 cm     IVC diam: 2.00 cm RV Mid diam:    1.60 cm RV S prime:     13.90 cm/s TAPSE (M-mode): 2.0 cm LEFT ATRIUM           Index        RIGHT ATRIUM          Index LA diam:      3.30 cm 1.66 cm/m   RA Area:     7.00 cm LA Vol (A2C): 43.3 ml 21.72 ml/m  RA Volume:   10.90 ml 5.47 ml/m LA Vol (A4C): 21.6 ml 10.83 ml/m  AORTIC VALVE                    PULMONIC VALVE AV Area (Vmax):    3.04 cm     PV Vmax:       0.65 m/s AV Area (Vmean):   2.79 cm     PV Peak grad:  1.7 mmHg AV Area (VTI):     2.72 cm AV Vmax:           116.00 cm/s AV Vmean:          92.100 cm/s AV VTI:            0.254 m AV Peak Grad:      5.4 mmHg AV Mean Grad:      4.0 mmHg LVOT Vmax:         92.70 cm/s LVOT Vmean:        67.600 cm/s LVOT VTI:          0.182 m LVOT/AV VTI ratio: 0.72  AORTA Ao Root diam: 3.50 cm Ao Asc diam:  3.70 cm MITRAL VALVE MV Area (PHT): 4.17 cm     SHUNTS MV Decel Time: 182 msec     Systemic VTI:  0.18 m MV E velocity: 90.80 cm/s   Systemic Diam: 2.20 cm MV A velocity: 101.00 cm/s MV E/A ratio:  0.90 Fransico Him MD Electronically signed by Fransico Him MD Signature Date/Time: 05/28/2022/4:25:37 PM    Final    CT ANGIO HEAD NECK W WO CM (CODE STROKE)  Result Date: 05/27/2022 CLINICAL DATA:  Initial evaluation for acute stroke.  She EXAM: CT ANGIOGRAPHY HEAD AND NECK TECHNIQUE: Multidetector CT imaging of the head and neck was performed using the standard protocol during bolus administration of intravenous contrast. Multiplanar CT image reconstructions and MIPs were obtained to evaluate the vascular anatomy. Carotid  stenosis measurements (when applicable) are obtained utilizing NASCET criteria, using the distal internal carotid diameter as the denominator. RADIATION DOSE REDUCTION: This exam  was performed according to the departmental dose-optimization program which includes automated exposure control, adjustment of the mA and/or kV according to patient size and/or use of iterative reconstruction technique. CONTRAST:  16m OMNIPAQUE IOHEXOL 350 MG/ML SOLN COMPARISON:  Prior CT from earlier the same day. FINDINGS: CTA NECK FINDINGS Aortic arch: Visualized aortic arch normal caliber with standard branching pattern. No stenosis about the origin the great vessels. Right carotid system: Right common and internal carotid arteries widely patent without stenosis, dissection or occlusion. Left carotid system: Left common and internal carotid arteries widely patent without stenosis, dissection or occlusion. Vertebral arteries: Both vertebral arteries arise from the subclavian arteries. No proximal subclavian artery stenosis. Both vertebral arteries widely patent without stenosis, dissection or occlusion. Skeleton: No discrete or worrisome osseous lesions. Two advanced multilevel cervical spondylosis at C4-5 through C6-7. Other neck: No other acute soft tissue abnormality within the neck. Upper chest: Visualized upper chest demonstrates no acute finding. Review of the MIP images confirms the above findings CTA HEAD FINDINGS Anterior circulation: Both internal carotid arteries widely patent to the termini without stenosis. A1 segments widely patent. Normal anterior communicating artery complex. Both anterior cerebral arteries widely patent to their distal aspects without stenosis. No M1 stenosis or occlusion. Normal MCA bifurcations. Distal MCA branches well perfused and symmetric. Posterior circulation: Both V4 segments tortuous without stenosis. Both PICA patent. Focal hyperdensity/calcification adjacent to the left V4 segment near the  takeoff of the left PICA favored to reflect cord plexus (series 9, image 136). No definite aneurysm. Basilar widely patent. Superior cerebellar and posterior cerebral arteries patent bilaterally. Venous sinuses: Patent allowing for timing the contrast bolus. Anatomic variants: None significant.  No aneurysm. Review of the MIP images confirms the above findings IMPRESSION: Negative CTA of the head and neck. No large vessel occlusion, hemodynamically significant stenosis, or other acute vascular abnormality. These results were communicated to Dr. ARory Percyat 8:39 pm on 05/27/2022 by text page via the [facility] system. Electronically Signed   By: BJeannine BogaM.D.   On: 05/27/2022 20:40   CT HEAD CODE STROKE WO CONTRAST  Result Date: 05/27/2022 CLINICAL DATA:  Code stroke. Initial evaluation for neuro deficit, stroke suspected./ EXAM: CT HEAD WITHOUT CONTRAST TECHNIQUE: Contiguous axial images were obtained from the base of the skull through the vertex without intravenous contrast. RADIATION DOSE REDUCTION: This exam was performed according to the departmental dose-optimization program which includes automated exposure control, adjustment of the mA and/or kV according to patient size and/or use of iterative reconstruction technique. COMPARISON:  Prior MRI from 12/12/2015. FINDINGS: Brain: Cerebral volume within normal limits for patient age. No evidence for acute intracranial hemorrhage. No findings to suggest acute large vessel territory infarct. No mass lesion, midline shift, or mass effect. Ventricles are normal in size without evidence for hydrocephalus. No extra-axial fluid collection identified. Vascular: No hyperdense vessel identified. Skull: Scalp soft tissues demonstrate no acute abnormality. Calvarium intact. Sinuses/Orbits: Globes and orbital soft tissues within normal limits. Visualized paranasal sinuses are clear. No mastoid effusion. ASPECTS ([facility] Score) -  Ganglionic level infarction (caudate, lentiform nuclei, internal capsule, insula, M1-M3 cortex): 7 - Supraganglionic infarction (M4-M6 cortex): 3 Total score (0-10 with 10 being normal): 10 IMPRESSION: 1. Normal head CT.  No acute intracranial abnormality. 2. ASPECTS is 10. These results were communicated to Dr. ARory Percyat 7:31 pm on 05/27/2022 by text page via the [facility] Assoc[facility]aging system. Electronically Signed   By: BJeannine BogaM.D.   On: 05/27/2022  19:33      HISTORY OF PRESENT ILLNESS Ms. Kaaren Nass is a 56 y.o. female with history of HTN, HLD, DM and OSA presenting with acute onset left sided numbness and left leg drift.  She presented to the ED and was given TNK.  Symptoms have since improved, but some residual diminished sensation on the left side remains, along with slowed fine motor movements in the left hand (patient is left handed).  Encouraged knitting and other activities using fine motor function to improve deficits.  MRI pending, to be performed around 1900 tonight.  Of note, patient states that she has been unable to do the things she needs to due to control her diabetes and hyperlipidemia due to recent loss of health insurance.   HOSPITAL COURSE Possible stroke s/p TNK due to uncontrolled stroke risk factors  Code Stroke CT head No acute abnormality. ASPECTS 10.    CTA head & neck No LVO or hemodynamically significant stenosis MRI no acute infarct 2D Echo EF 55-60% LDL 237 HgbA1c 11.9 VTE prophylaxis - SCDs No antithrombotic prior to admission, now on ASA 64m and plavix 799mfor 3 weeks and then ASA 8179mlone Therapy recommendations:  No follow up needed Disposition:  home   Hypertension Home meds:  amlodipine 10 mg daily, losartan 100 mg daily Stable Long-term BP goal normotensive   Hyperlipidemia Home meds:  none LDL 237, goal < 70 Add rosuvastatin 40 mg daily  Continue statin at discharge   Diabetes type II Uncontrolled Home meds:  semaglutide 1 mg weekly,  insulin detemir 12 units BID HgbA1c 11.9, goal < 7.0 Currently on 70/30 15 units BID CBGs SSI Hyperglycemia Appreciate DM coordinator assistance  Recent inability to obtain medications due to loss of health insurance Will place [facility] to determine options for outpatient diabetes management   Other Stroke Risk Factors Obesity, Body mass index is 33.9 kg/m., BMI >/= 30 associated with increased stroke risk, recommend weight loss, diet and exercise as appropriate  History of migraines in young   DISCHARGE EXAM Blood pressure 136/89, pulse 75, temperature 98.3 F (36.8 C), temperature source Oral, resp. rate 20, height 5' 5"  (1.651 m), weight 92.4 kg, last menstrual period 12/10/2015, SpO2 91 %. General:  Alert, well-nourished, well-developed patient in no acute distress Respiratory: Regular, unlabored respirations on room air   NEURO:  Mental Status: AA&Ox3  Speech/Language: speech is without dysarthria or aphasia.  Naming, repetition, fluency, and comprehension intact.   Cranial Nerves:  II: PERRL. Visual fields full.  III, IV, VI: EOMI. Eyelids elevate symmetrically.  V: Sensation is intact to light touch and diminished on the left  VII: Smile is symmetrical.   VIII: hearing intact to voice. IX, X: Phonation is normal.  XII: tongue is midline without fasciculations. Motor: 5/5 strength to all muscle groups tested.  Tone: is normal and bulk is normal Sensation- Intact to light touch bilaterally but diminished on the left Coordination: FTN intact bilaterally, HKS: no ataxia in BLE.No drift.  Diminished fine motor movements on the left Gait- deferred  Discharge Diet       Diet   Diet heart healthy/carb modified Room service appropriate? Yes with Assist; Fluid consistency: Thin   liquids  DISCHARGE PLAN Disposition:  Home aspirin 81 mg daily and clopidogrel 75 mg daily for secondary stroke prevention for 3 weeks then ASA 39m79mone. Ongoing stroke risk factor control  by Primary Care Physician at time of discharge Follow-up PCP WilsDorna Mai in 2 weeks.  Follow-up in [facility] in 4 weeks, office to schedule an appointment.   35 minutes were spent preparing discharge.  Patient seen and examined by NP/APP with MD. MD to update note as needed.   Janine Ores, DNP, FNP-BC [facility] Pager: [phone]   ATTENDING NOTE: I reviewed above note and agree with the assessment and plan. Pt was seen and examined.   RN at bedside.  Patient doing well, still complaining of mild perioral tingling, otherwise left-sided numbness/tingling resolved, and left lower extremity weakness resolved also.  MRI overnight no acute abnormality.  Patient does have severe HLD and uncontrolled diabetes, currently on statin and insulin.  TOC team informed for medication and follow-up appointment assistance.  Continue DAPT for 3 weeks and then aspirin alone.  Patient will be discharging good condition.  Follow-up at [facility] in 4 weeks.  For detailed assessment and plan, please refer to above/below as I have made changes wherever appropriate.   Rosalin Hawking, MD PhD Stroke Neurology 05/29/2022 12:08 PM

## 2022-05-29 NOTE — Progress Notes (Signed)
PT Cancellation Note  Patient Details Name: Sydny Schnitzler MRN: [id number] DOB: March 10, 1966   Cancelled Treatment:    Reason Eval/Treat Not Completed: Other (comment). Pt currently in the process of being discharged home. Will plan to follow-up another day if pt does not end up discharging today.   Moishe Spice, PT, DPT Acute Rehabilitation Services  Office: [place] 05/29/2022, 10:17 AM

## 2022-06-01 ENCOUNTER — Telehealth: Payer: Self-pay

## 2022-06-01 NOTE — Telephone Encounter (Signed)
Transition Care Management Unsuccessful Follow-up Telephone Call  Date of discharge and from where:  05/29/2022, [facility]   Attempts:  1st Attempt  Reason for unsuccessful TCM follow-up call:  Left voice message on # [phone], call back requested.  Need to schedule hospital follow up appointment with PCP

## 2022-06-03 ENCOUNTER — Other Ambulatory Visit: Payer: Self-pay | Admitting: Pharmacist

## 2022-06-03 ENCOUNTER — Telehealth: Payer: Self-pay

## 2022-06-03 DIAGNOSIS — E119 Type 2 diabetes mellitus without complications: Secondary | ICD-10-CM

## 2022-06-03 MED ORDER — "BD SAFETYGLIDE INSULIN SYRINGE 31G X 15/64"" 0.5 ML MISC": 0 refills | Status: DC

## 2022-06-03 NOTE — Telephone Encounter (Signed)
Transition Care Management Follow-up Telephone Call Date of discharge and from where: 05/29/2022, [facility] How have you been since you were released from the hospital? She said she is very fatigued and has been experiencing some shortness of breath at rest and with activity. She denied any nausea.  She also reports occasional numbness of her left hand and both feet. She thinks this is an adverse reaction to the plavix not crestor and would like to stop it. Dr Redmond Pulling, please advise.  I offered her the option of being seen at [facility] today and she did not feel that she needs to be seen today. She did not feel she needed to go to the ED or Urgent Care.  Any questions or concerns? Yes- noted above She explained that the hospital gave her the vials of her insulin that they were using in the hospital.  They gave her some needles/ syringes but she said it was not enough. She usually uses the insulin pen but was appreciative to have the vials because money it tight at home, her husband recently lost his job. I spoke to [employer], [facility] and he placed an order for the syringes/ needles that the patient requested - safety glide  31g x 15/16, 0.5 ml. I called patient back and informed her that this order was placed.  Items Reviewed: Did the pt receive and understand the discharge instructions provided? Yes  Medications obtained and verified? Yes - she said she has all of her medications as well as a Freestyle cgm.  She said her blood sugar was elevated this morning - 200's because she did not have her insulin yesterday evening.  Other? No  Any new allergies since your discharge? No - she is questioning if she is having an adverse reaction to the plavix.  Dietary orders reviewed? Yes Do you have support at home? Yes , her husband  [place] and Equipment/Supplies: Were home health services ordered? no If so, what is the name of the agency? N/a  Has the agency set up a time to come to the patient's  home? not applicable Were any new equipment or medical supplies ordered?  No What is the name of the medical supply agency? N/a Were you able to get the supplies/equipment? not applicable Do you have any questions related to the use of the equipment or supplies? No  Functional Questionnaire: (I = Independent and D = Dependent) ADLs: independent  Follow up appointments reviewed:  PCP Hospital f/u appt confirmed? Yes  Scheduled to see Durene Fruits, NP - 06/12/2022.  [facility] f/u appt confirmed? Yes  Scheduled to see neurology - 07/08/2022. Are transportation arrangements needed? No  If their condition worsens, is the pt aware to call PCP or go to the Emergency Dept.? Yes Was the patient provided with contact information for the PCP's office or ED? Yes Was to pt encouraged to call back with questions or concerns? Yes

## 2022-06-03 NOTE — Telephone Encounter (Signed)
I called the patient and explained Dr Dois Davenport recommendation: Would advise patient to be seen by health care professional asap - ED or UC.  She said she really doesn't want to go to ED/UC. I explained to her the importance of being  assessed by a health care professional and she said she understood.

## 2022-06-04 NOTE — Progress Notes (Deleted)
TRANSITION OF CARE VISIT   Date of Admission: 05/27/2022  Date of Discharge: 05/29/2022   Transitions of Care Call:   Discharged from: [facility]   Discharge Diagnosis:  TIA vs. Anxiety s/p TNK due to stroke like symptoms and uncontrolled stroke risk factors   Secondary Problem: HTN HLD Uncontrolled DM Obesity Hx of migraine  Summary of Admission per MD note:  HOSPITAL COURSE Possible stroke s/p TNK due to uncontrolled stroke risk factors  Code Stroke CT head No acute abnormality. ASPECTS 10.    CTA head & neck No LVO or hemodynamically significant stenosis MRI no acute infarct 2D Echo EF 55-60% LDL 237 HgbA1c 11.9 VTE prophylaxis - SCDs No antithrombotic prior to admission, now on ASA '81mg'$  and plavix '75mg'$  for 3 weeks and then ASA '81mg'$  alone Therapy recommendations:  No follow up needed Disposition:  home   Hypertension Home meds:  amlodipine 10 mg daily, losartan 100 mg daily Stable Long-term BP goal normotensive   Hyperlipidemia Home meds:  none LDL 237, goal < 70 Add rosuvastatin 40 mg daily  Continue statin at discharge   Diabetes type II Uncontrolled Home meds:  semaglutide 1 mg weekly, insulin detemir 12 units BID HgbA1c 11.9, goal < 7.0 Currently on 70/30 15 units BID CBGs SSI Hyperglycemia Appreciate DM coordinator assistance  Recent inability to obtain medications due to loss of health insurance Will place [facility] consult to determine options for outpatient diabetes management   Other Stroke Risk Factors Obesity, Body mass index is 33.9 kg/m., BMI >/= 30 associated with increased stroke risk, recommend weight loss, diet and exercise as appropriate  History of migraines in young  DISCHARGE PLAN Disposition:  Home aspirin 81 mg daily and clopidogrel 75 mg daily for secondary stroke prevention for 3 weeks then ASA '81mg'$  alone. Ongoing stroke risk factor control by Primary Care Physician at  time of discharge Follow-up PCP Dorna Mai, MD in 2 weeks. Follow-up in [facility] in 4 weeks, office to schedule an appointment.   ATTENDING NOTE: I reviewed above note and agree with the assessment and plan. Pt was seen and examined.    RN at bedside.  Patient doing well, still complaining of mild perioral tingling, otherwise left-sided numbness/tingling resolved, and left lower extremity weakness resolved also.  MRI overnight no acute abnormality.  Patient does have severe HLD and uncontrolled diabetes, currently on statin and insulin.  TOC team informed for medication and follow-up appointment assistance.  Continue DAPT for 3 weeks and then aspirin alone.  Patient will be discharging good condition.  Follow-up at [facility] in 4 weeks.   Follow-Ups Follow up with Dorna Mai, MD (Family Medicine); We are working on getting a follow up appointment for you in July, and will contact you with appointment date/time. Schedule an appointment with [facility] (Neurology) in 1 month (05/29/2022); stroke clinic    [facility] CALL Eden Lathe RN 06/03/2022   Transition Care Management Follow-up Telephone Call Date of discharge and from where: 05/29/2022, [facility] How have you been since you were released from the hospital? She said she is very fatigued and has been experiencing some shortness of breath at rest and with activity. She denied any nausea.  She also reports occasional numbness of her left hand and both feet. She thinks this is an adverse reaction to the plavix not crestor and would like to stop it. Dr Redmond Pulling, please advise.  I offered her the option of being seen at [facility]  today and she did not feel that she needs to be seen today. She did not feel she needed to go to the ED or Urgent Care.  Any questions or concerns? Yes- noted above She explained that the hospital gave her the vials of her insulin that they were using in the hospital.   They gave her some needles/ syringes but she said it was not enough. She usually uses the insulin pen but was appreciative to have the vials because money it tight at home, her husband recently lost his job. I spoke to [employer], [facility] and he placed an order for the syringes/ needles that the patient requested - safety glide  31g x 15/16, 0.5 ml. I called patient back and informed her that this order was placed.   Items Reviewed: Did the pt receive and understand the discharge instructions provided? Yes  Medications obtained and verified? Yes - she said she has all of her medications as well as a Freestyle cgm.  She said her blood sugar was elevated this morning - 200's because she did not have her insulin yesterday evening.  Other? No  Any new allergies since your discharge? No - she is questioning if she is having an adverse reaction to the plavix.  Dietary orders reviewed? Yes Do you have support at home? Yes , her husband   [place] and Equipment/Supplies: Were home health services ordered? no If so, what is the name of the agency? N/a  Has the agency set up a time to come to the patient's home? not applicable Were any new equipment or medical supplies ordered?  No What is the name of the medical supply agency? N/a Were you able to get the supplies/equipment? not applicable Do you have any questions related to the use of the equipment or supplies? No   Functional Questionnaire: (I = Independent and D = Dependent) ADLs: independent   Follow up appointments reviewed:   PCP Hospital f/u appt confirmed? Yes  Scheduled to see Durene Fruits, NP - 06/12/2022.  [facility] f/u appt confirmed? Yes  Scheduled to see neurology - 07/08/2022. Are transportation arrangements needed? No  If their condition worsens, is the pt aware to call PCP or go to the Emergency Dept.? Yes Was the patient provided with contact information for the PCP's office or ED? Yes Was to pt encouraged to call  back with questions or concerns? Yes       06/03/2022 per Redmond Pulling Would advise patient to be seen by health care professional asap - ED or UC   06/03/2022 per Opal Sidles I called the patient and explained Dr Dois Davenport recommendation: Would advise patient to be seen by health care professional asap - ED or UC. She said she really doesn't want to go to ED/UC. I explained to her the importance of being  assessed by a health care professional and she said she understood.     TODAY's VISIT Possible stroke s/p TNK due to uncontrolled stroke risk factors  Code Stroke CT head No acute abnormality. ASPECTS 10.    CTA head & neck No LVO or hemodynamically significant stenosis MRI no acute infarct 2D Echo EF 55-60% LDL 237 HgbA1c 11.9 VTE prophylaxis - SCDs No antithrombotic prior to admission, now on ASA '81mg'$  and plavix '75mg'$  for 3 weeks and then ASA '81mg'$  alone Therapy recommendations:  No follow up needed Disposition:  home   Hypertension Home meds:  amlodipine 10 mg daily, losartan 100 mg daily Stable Long-term BP  goal normotensive   Hyperlipidemia Home meds:  none LDL 237, goal < 70 Add rosuvastatin 40 mg daily  Continue statin at discharge   Diabetes type II Uncontrolled Home meds:  semaglutide 1 mg weekly, insulin detemir 12 units BID HgbA1c 11.9, goal < 7.0 Currently on 70/30 15 units BID CBGs SSI Hyperglycemia Appreciate DM coordinator assistance  Recent inability to obtain medications due to loss of health insurance [facility] place [facility] consult to determine options for outpatient diabetes management   Other Stroke Risk Factors Obesity, Body mass index is 33.9 kg/m., BMI >/= 30 associated with increased stroke risk, recommend weight loss, diet and exercise as appropriate  History of migraines in young  DISCHARGE PLAN Disposition:  Home aspirin 81 mg daily and clopidogrel 75 mg daily for secondary stroke prevention for 3 weeks then ASA '81mg'$  alone. Ongoing stroke risk factor control  by Primary Care Physician at time of discharge Follow-up PCP Dorna Mai, MD in 2 weeks. Follow-up in [facility] in 4 weeks, office to schedule an appointment.   ATTENDING NOTE: I reviewed above note and agree with the assessment and plan. Pt was seen and examined.    RN at bedside.  Patient doing well, still complaining of mild perioral tingling, otherwise left-sided numbness/tingling resolved, and left lower extremity weakness resolved also.  MRI overnight no acute abnormality.  Patient does have severe HLD and uncontrolled diabetes, currently on statin and insulin.  TOC team informed for medication and follow-up appointment assistance.  Continue DAPT for 3 weeks and then aspirin alone.  Patient will be discharging good condition.  Follow-up at [facility] in 4 weeks.   Follow-Ups Follow up with Dorna Mai, MD (Family Medicine); We are working on getting a follow up appointment for you in July, and will contact you with appointment date/time. Schedule an appointment with [facility] (Neurology) in 1 month (05/29/2022); stroke clinic APPT WITH NEURO 07/08/2022   Patient/Caregiver self-reported problems/concerns: see above  MEDICATIONS  Medication Reconciliation conducted with patient/caregiver? (Yes/ No): Yes  New medications prescribed/discontinued upon discharge? (Yes/No): Yes  Barriers identified related to medications: No  LABS  Lab Reviewed (Yes/No/NA): Yes  PHYSICAL EXAM:    ASSESSMENT AND PLAN:   PATIENT EDUCATION PROVIDED: See AVS   FOLLOW-UP (Include any further testing or referrals): ***

## 2022-06-04 NOTE — Progress Notes (Signed)
TRANSITION OF CARE VISIT   Date of Admission: 05/27/2022  Date of Discharge: 05/29/2022  Transitions of Care Call: 06/03/2022  Discharged from: [facility]  Discharge Diagnosis:  TIA vs. Anxiety s/p TNK due to stroke like symptoms and uncontrolled stroke risk factors   Secondary Problem: HTN HLD Uncontrolled DM Obesity Hx of migraine  Summary of Admission per MD note:  Possible stroke s/p TNK due to uncontrolled stroke risk factors  Code Stroke CT head No acute abnormality. ASPECTS 10.    CTA head & neck No LVO or hemodynamically significant stenosis MRI no acute infarct 2D Echo EF 55-60% LDL 237 HgbA1c 11.9 VTE prophylaxis - SCDs No antithrombotic prior to admission, now on ASA '81mg'$  and plavix '75mg'$  for 3 weeks and then ASA '81mg'$  alone Therapy recommendations:  No follow up needed Disposition:  home   Hypertension Home meds:  amlodipine 10 mg daily, losartan 100 mg daily Stable Long-term BP goal normotensive   Hyperlipidemia Home meds:  none LDL 237, goal < 70 Add rosuvastatin 40 mg daily  Continue statin at discharge   Diabetes type II Uncontrolled Home meds:  semaglutide 1 mg weekly, insulin detemir 12 units BID HgbA1c 11.9, goal < 7.0 Currently on 70/30 15 units BID CBGs SSI Hyperglycemia Appreciate DM coordinator assistance  Recent inability to obtain medications due to loss of health insurance Will place [facility] consult to determine options for outpatient diabetes management   Other Stroke Risk Factors Obesity, Body mass index is 33.9 kg/m., BMI >/= 30 associated with increased stroke risk, recommend weight loss, diet and exercise as appropriate  History of migraines in young    ATTENDING NOTE: I reviewed above note and agree with the assessment and plan. Pt was seen and examined.    RN at bedside.  Patient doing well, still complaining of mild perioral tingling, otherwise left-sided numbness/tingling resolved, and left lower extremity weakness  resolved also.  MRI overnight no acute abnormality.  Patient does have severe HLD and uncontrolled diabetes, currently on statin and insulin.  TOC team informed for medication and follow-up appointment assistance.  Continue DAPT for 3 weeks and then aspirin alone.  Patient will be discharging good condition.  Follow-up at [facility] in 4 weeks.   For detailed assessment and plan, please refer to above/below as I have made changes wherever appropriate.    Follow-Ups  Follow up with Jessica Mai, MD (Family Medicine); We are working on getting a follow up appointment for you in July, and will contact you with appointment date/time. Schedule an appointment with [facility] (Neurology) in 1 month (05/29/2022); stroke clinic  Per Jessica Lathe, RN 06/03/2022 Transition Care Management Follow-up Telephone Call Date of discharge and from where: 05/29/2022, [facility] How have you been since you were released from the hospital? She said she is very fatigued and has been experiencing some shortness of breath at rest and with activity. She denied any nausea.  She also reports occasional numbness of her left hand and both feet. She thinks this is an adverse reaction to the plavix not crestor and would like to stop it. Jessica Ponce, please advise.  I offered her the option of being seen at [facility] today and she did not feel that she needs to be seen today. She did not feel she needed to go to the ED or Urgent Care.  Any questions or concerns? Yes- noted above She explained that the hospital gave her the vials of her insulin that they were  using in the hospital.  They gave her some needles/ syringes but she said it was not enough. She usually uses the insulin pen but was appreciative to have the vials because money it tight at home, her husband recently lost his job. I spoke to [employer], [facility] and he placed an order for the syringes/ needles that the patient requested - safety glide  31g x 15/16,  0.5 ml. I called patient back and informed her that this order was placed.   Items Reviewed: Did the pt receive and understand the discharge instructions provided? Yes  Medications obtained and verified? Yes - she said she has all of her medications as well as a Freestyle cgm.  She said her blood sugar was elevated this morning - 200's because she did not have her insulin yesterday evening.  Other? No  Any new allergies since your discharge? No - she is questioning if she is having an adverse reaction to the plavix.  Dietary orders reviewed? Yes Do you have support at home? Yes , her husband   [place] and Equipment/Supplies: Were home health services ordered? no If so, what is the name of the agency? N/a  Has the agency set up a time to come to the patient's home? not applicable Were any new equipment or medical supplies ordered?  No What is the name of the medical supply agency? N/a Were you able to get the supplies/equipment? not applicable Do you have any questions related to the use of the equipment or supplies? No   Functional Questionnaire: (I = Independent and D = Dependent) ADLs: independent   Follow up appointments reviewed: PCP Hospital f/u appt confirmed? Yes  Scheduled to see Jessica Fruits, NP - 06/12/2022.  [facility] f/u appt confirmed? Yes  Scheduled to see neurology - 07/08/2022. Are transportation arrangements needed? No  If their condition worsens, is the pt aware to call PCP or go to the Emergency Dept.? Yes Was the patient provided with contact information for the PCP's office or ED? Yes Was to pt encouraged to call back with questions or concerns? Yes   Per Jessica Mai, MD 06/03/2022 Would advise patient to be seen by health care professional asap - ED or UC   Per Jessica Lathe, RN 06/03/2022 I called the patient and explained Jessica Ponce recommendation: Would advise patient to be seen by health care professional asap - ED or UC. She said she really doesn't  want to go to ED/UC. I explained to her the importance of being  assessed by a health care professional and she said she understood.    Today's Visit Reports blurry vision bilaterally. Wearing reader glasses. Reports her eye doctor told her that the blurry vision is unrelated to vision concerns but instead secondary to stroke. Patient reports she doesn't understand how the vision issues are related to the stroke because it didn't begin until weeks after the stroke. She is taking all medications as prescribed except for Ozempic. Reports Ozempic caused upset stomach. Needs refill on Gabapentin stating this helps her left hand and left foot numbness. Also having numbness of the face. Appointment scheduled 07/08/22 Ward Givens, NP at [facility]. She is on the cancellation list so that she can get an earlier appointment with them if possible. Reports out of work since 05/28/2022. 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 States she is unable to see computer screen and will need to remain out of work until improved. Reports  she and her husband have began completing the FMLA paperwork and plans to drop off paperwork so that her PCP can complete. Reports stress of getting paperwork complete so that she does not lose her job. Also, concern that her daughter is transitioning to female. No further issues/concerns.  Patient/Caregiver self-reported problems/concerns: See above  MEDICATIONS  Medication Reconciliation conducted with patient/caregiver? (Yes/ No): Yes  New medications prescribed/discontinued upon discharge? (Yes/No): Yes  Barriers identified related to medications: No  LABS  Lab Reviewed (Yes/No/NA): Yes  PHYSICAL EXAM:      06/08/2022    3:55 PM 06/07/2022    2:15 AM 06/07/2022    1:51 AM  Vitals with BMI  Height 5' 5.079"    Weight 198 lbs    BMI 99.24    Systolic 268 341 962  Diastolic 90 76 75  Pulse 79 73 75    Physical Exam HENT:      Head: Normocephalic and atraumatic.  Eyes:     Extraocular Movements: Extraocular movements intact.     Conjunctiva/sclera: Conjunctivae normal.     Pupils: Pupils are equal, round, and reactive to light.  Cardiovascular:     Rate and Rhythm: Normal rate and regular rhythm.     Pulses: Normal pulses.     Heart sounds: Normal heart sounds.  Pulmonary:     Effort: Pulmonary effort is normal.     Breath sounds: Normal breath sounds.  Musculoskeletal:     Cervical back: Normal range of motion and neck supple.  Neurological:     General: No focal deficit present.     Mental Status: She is alert and oriented to person, place, and time.     Motor: Motor function is intact.     Coordination: Coordination is intact.     Gait: Gait is intact.     Comments: Smile and frown normal/equal. Stick out tongue midline.   Psychiatric:        Mood and Affect: Mood normal.        Behavior: Behavior normal.     ASSESSMENT AND PLAN: 1. Hospital discharge follow-up - Reviewed hospital course, current medications, ensured proper follow-up in place, and addressed concerns.   2. Stroke risk 3. History of migraine - Keep appointment scheduled 07/08/22 with Ward Givens, NP at [facility].  4. Blurry vision, bilateral - Counseled to keep all appointments with established eye doctor. Plans to discuss more at Neurology appointment next month.  5. Hypertension, essential - Continue present management Amlodipine and Losartan as prescribed.  - Counseled on blood pressure goal of less than 130/80, low-sodium, DASH diet, medication compliance, and 150 minutes of moderate intensity exercise per week as tolerated. Counseled on medication adherence and adverse effects. - Follow-up with primary provider as scheduled.   6. Hyperlipidemia, unspecified hyperlipidemia type - Continue present management Rosuvastatin as prescribed.  - Follow-up with primary provider as scheduled.   7.  Uncontrolled type 2 diabetes mellitus with hyperglycemia (HCC) - Hemoglobin A1c 11.9% on 05/28/2022. - Continue present management insulin and Gabapentin as prescribed.  - Patient reports intolerant to Ozempic, caused gastrointestinal upset.  - Discussed the importance of healthy eating habits, low-carbohydrate diet, low-sugar diet, regular aerobic exercise (at least 150 minutes a week as tolerated) and medication compliance to achieve or maintain control of diabetes. - Follow-up with primary provider as scheduled.  - gabapentin (NEURONTIN) 300 MG capsule; Take 1 capsule (300 mg total) by mouth 3 (three) times daily.  Dispense: 90 capsule; Refill: 0  8. Encounter for completion of form with patient - Made patient aware that she will need appointment with PCP for completion of FMLA. Patient verbalized understanding.    PATIENT EDUCATION PROVIDED: See AVS   FOLLOW-UP (Include any further testing or referrals):  - Keep appointment scheduled with [facility]. - Follow-up with PCP in 7 days or sooner if needed.

## 2022-06-06 ENCOUNTER — Emergency Department (HOSPITAL_COMMUNITY)
Admission: EM | Admit: 2022-06-06 | Discharge: 2022-06-07 | Disposition: A | Discharge status: Home / Self Care | Payer: Managed Care, Other (non HMO) | Attending: Emergency Medicine | Admitting: Emergency Medicine

## 2022-06-06 ENCOUNTER — Encounter (HOSPITAL_COMMUNITY): Payer: Self-pay | Admitting: Emergency Medicine

## 2022-06-06 ENCOUNTER — Other Ambulatory Visit: Payer: Self-pay

## 2022-06-06 DIAGNOSIS — Z7982 Long term (current) use of aspirin: Secondary | ICD-10-CM | POA: Diagnosis not present

## 2022-06-06 DIAGNOSIS — Z20822 Contact with and (suspected) exposure to covid-19: Secondary | ICD-10-CM | POA: Insufficient documentation

## 2022-06-06 DIAGNOSIS — Z79899 Other long term (current) drug therapy: Secondary | ICD-10-CM | POA: Diagnosis not present

## 2022-06-06 DIAGNOSIS — J45909 Unspecified asthma, uncomplicated: Secondary | ICD-10-CM | POA: Diagnosis not present

## 2022-06-06 DIAGNOSIS — H538 Other visual disturbances: Secondary | ICD-10-CM | POA: Diagnosis present

## 2022-06-06 DIAGNOSIS — I10 Essential (primary) hypertension: Secondary | ICD-10-CM | POA: Diagnosis not present

## 2022-06-06 DIAGNOSIS — Z794 Long term (current) use of insulin: Secondary | ICD-10-CM | POA: Insufficient documentation

## 2022-06-06 DIAGNOSIS — G43109 Migraine with aura, not intractable, without status migrainosus: Secondary | ICD-10-CM | POA: Diagnosis not present

## 2022-06-06 DIAGNOSIS — E119 Type 2 diabetes mellitus without complications: Secondary | ICD-10-CM | POA: Diagnosis not present

## 2022-06-06 DIAGNOSIS — Z7901 Long term (current) use of anticoagulants: Secondary | ICD-10-CM | POA: Insufficient documentation

## 2022-06-06 LAB — I-STAT BETA HCG BLOOD, ED (MC, WL, AP ONLY): I-stat hCG, quantitative: <5 m[IU]/mL

## 2022-06-06 LAB — I-STAT CHEM 8, ED
BUN: 23 mg/dL — ABNORMAL HIGH (ref 6–20)
Calcium, Ion: 1.11 mmol/L — ABNORMAL LOW (ref 1.15–1.40)
Chloride: 104 mmol/L (ref 98–111)
Creatinine, Ser: 0.7 mg/dL (ref 0.44–1.00)
Glucose, Bld: 399 mg/dL — ABNORMAL HIGH (ref 70–99)
HCT: 40 % (ref 36.0–46.0)
Hemoglobin: 13.6 g/dL (ref 12.0–15.0)
Potassium: 4.2 mmol/L (ref 3.5–5.1)
Sodium: 138 mmol/L (ref 135–145)
TCO2: 24 mmol/L (ref 22–32)

## 2022-06-06 NOTE — ED Provider Notes (Signed)
[facility] EMERGENCY DEPARTMENT Provider Note   CSN: [id number] Arrival date & time: 06/06/22  2221     History {Add pertinent medical, surgical, social history, OB history to HPI:1} Chief Complaint  Patient presents with   Blurred Vision    Jessica Ponce is a 56 y.o. female.  56 year old female presents today for evaluation of vision change, perioral tingling, paresthesias in left hand, left foot.  Onset since 5 PM yesterday.  Patient was recently admitted for strokelike symptoms where she received TNK from 6/28 to 6/30.  Reports compliance with aspirin and Plavix.  Reports ongoing blurry vision.  She does have history of SVT, but not A-fib.  Other risk factors include hyperlipidemia, hypertension, diabetes, and obesity.  The history is provided by the patient. No language interpreter was used.       Home Medications Prior to Admission medications   Medication Sig Start Date End Date Taking? Authorizing Provider  amLODipine (NORVASC) 10 MG tablet Take 1 tablet (10 mg total) by mouth daily. 04/08/22   Dorna Mai, MD  aspirin EC 81 MG tablet Take 1 tablet (81 mg total) by mouth daily. Swallow whole. 05/30/22   Janine Ores, NP  Blood Glucose Monitoring Suppl (ONE TOUCH ULTRA MINI) w/Device KIT Used to check fasting FSBS daily. Dx: E11.9 07/15/20   Mayers, Cari S, PA-C  clopidogrel (PLAVIX) 75 MG tablet Take 1 tablet (75 mg total) by mouth daily. 05/30/22   Janine Ores, NP  Continuous Blood Gluc Sensor (FREESTYLE LIBRE 2 SENSOR) MISC Utilize as directed to check blood glucose 04/10/22   Dorna Mai, MD  DM-Doxylamine-Acetaminophen [facility] COLD & FLU PO) Take 2 tablets by mouth at bedtime as needed (cold symptoms).    [provider]  glucose blood (ONETOUCH ULTRA) test strip Check sugars twice daily. Dx: E11.9 01/08/21   Nicolette Bang, MD  Insulin Lispro Prot & Lispro (HUMALOG MIX 75/25 KWIKPEN) (75-25) 100 UNIT/ML Kwikpen Inject 20 Units into the  skin 2 (two) times daily with a meal. 05/29/22   Janine Ores, NP  Insulin Pen Needle (PEN NEEDLES) 32G X 4 MM MISC 1 application by Does not apply route daily. 10/21/21   Dorna Mai, MD  Insulin Pen Needle 32G X 4 MM MISC Use as directed with insulin 05/29/22   Rosalin Hawking, MD  Insulin Syringe-Needle U-100 (BD SAFETYGLIDE INSULIN SYRINGE) 31G X 15/64" 0.5 ML MISC Use to inject insulin twice daily. 06/03/22   Charlott Rakes, MD  Lancets (ONETOUCH ULTRASOFT) lancets Use to check fasting FSBS daily. Dx: E11.9 07/15/20   Mayers, Cari S, PA-C  losartan (COZAAR) 100 MG tablet Take 1 tablet (100 mg total) by mouth daily. 04/08/22   Dorna Mai, MD  rosuvastatin (CRESTOR) 40 MG tablet Take 1 tablet (40 mg total) by mouth daily. 05/30/22   Janine Ores, NP  sertraline (ZOLOFT) 100 MG tablet Take 1 tablet (100 mg total) by mouth daily. 01/21/22   Dorna Mai, MD  Vitamin D, Ergocalciferol, (DRISDOL) 1.25 MG (50000 UNIT) CAPS capsule Take 1 capsule (50,000 Units total) by mouth every 7 (seven) days. Patient not taking: Reported on 05/27/2022 02/12/21   Nicolette Bang, MD      Allergies    Valsartan, Atorvastatin, Codeine, and Metformin and related    Review of Systems   Review of Systems  Constitutional:  Negative for fever.  Eyes:  Positive for visual disturbance.  Gastrointestinal:  Negative for nausea and vomiting.  Neurological:  Positive for numbness. Negative for  weakness.  All other systems reviewed and are negative.   Physical Exam Updated Vital Signs BP 132/79 (BP Location: Right Arm)   Pulse 81   Resp 18   Ht 5' 5"  (1.651 m)   Wt 92.4 kg   LMP 12/10/2015 (LMP Unknown)   SpO2 96%   BMI 33.90 kg/m  Physical Exam Vitals and nursing note reviewed.  Constitutional:      General: She is not in acute distress.    Appearance: Normal appearance. She is not ill-appearing.  HENT:     Head: Normocephalic and atraumatic.     Nose: Nose normal.  Eyes:     General: No  scleral icterus.    Extraocular Movements: Extraocular movements intact.     Conjunctiva/sclera: Conjunctivae normal.  Cardiovascular:     Rate and Rhythm: Normal rate and regular rhythm.     Pulses: Normal pulses.  Pulmonary:     Effort: Pulmonary effort is normal. No respiratory distress.     Breath sounds: Normal breath sounds. No wheezing or rales.  Abdominal:     General: There is no distension.     Tenderness: There is no abdominal tenderness.  Musculoskeletal:        General: Normal range of motion.     Cervical back: Normal range of motion.  Skin:    General: Skin is warm and dry.  Neurological:     General: No focal deficit present.     Mental Status: She is alert. Mental status is at baseline.     Comments: Cranial nerves III through XII intact.  Tongue midline.  Without dysarthria or facial droop.  Full range of motion in bilateral upper and lower extremities, 5/5 strength in extensor and flexor muscle groups of upper and lower extremities.  Sensation intact and symmetrical bilaterally.  Without pronator drift.     ED Results / Procedures / Treatments   Labs (all labs ordered are listed, but only abnormal results are displayed) Labs Reviewed  I-STAT CHEM 8, ED - Abnormal; Notable for the following components:      Result Value   BUN 23 (*)    Glucose, Bld 399 (*)    Calcium, Ion 1.11 (*)    All other components within normal limits  RESP PANEL BY RT-PCR (FLU A&B, COVID) ARPGX2  ETHANOL  PROTIME-INR  APTT  CBC  DIFFERENTIAL  COMPREHENSIVE METABOLIC PANEL  RAPID URINE DRUG SCREEN, HOSP PERFORMED  URINALYSIS, ROUTINE W REFLEX MICROSCOPIC  I-STAT BETA HCG BLOOD, ED ([facility], [facility], [facility] ONLY)    EKG None  Radiology No results found.  Procedures Procedures  {Document cardiac monitor, telemetry assessment procedure when appropriate:1}  Medications Ordered in ED Medications - No data to display  ED Course/ Medical Decision Making/ A&P                            Medical Decision Making Amount and/or Complexity of Data Reviewed Labs: ordered. Radiology: ordered.   Medical Decision Making / ED Course   This patient presents to the ED for concern of ***, this involves an extensive number of treatment options, and is a complaint that carries with it a high risk of complications and morbidity.  The differential diagnosis includes ***  MDM: ***   Additional history obtained: -Additional history obtained from *** -External records from outside source obtained and reviewed including: Chart review including previous notes, labs, imaging, consultation notes   Lab Tests: -I ordered,  reviewed, and interpreted labs.   The pertinent results include:   Labs Reviewed  I-STAT CHEM 8, ED - Abnormal; Notable for the following components:      Result Value   BUN 23 (*)    Glucose, Bld 399 (*)    Calcium, Ion 1.11 (*)    All other components within normal limits  RESP PANEL BY RT-PCR (FLU A&B, COVID) ARPGX2  ETHANOL  PROTIME-INR  APTT  CBC  DIFFERENTIAL  COMPREHENSIVE METABOLIC PANEL  RAPID URINE DRUG SCREEN, HOSP PERFORMED  URINALYSIS, ROUTINE W REFLEX MICROSCOPIC  I-STAT BETA HCG BLOOD, ED ([facility], [facility], [facility] ONLY)      EKG  EKG Interpretation  Date/Time:    Ventricular Rate:    PR Interval:    QRS Duration:   QT Interval:    QTC Calculation:   R Axis:     Text Interpretation:           Imaging Studies ordered: I ordered imaging studies including *** I independently visualized and interpreted imaging. I agree with the radiologist interpretation   Medicines ordered and prescription drug management: No orders of the defined types were placed in this encounter.   -I have reviewed the patients home medicines and have made adjustments as needed  Critical interventions ***  Consultations Obtained: I requested consultation with the neurology,  and discussed lab and imaging findings as well as pertinent plan - they recommend: Given  recent admission and work-up patient did not have CVA.  Given patient's symptoms of blurry vision, and perioral tingling Dr. Leonel Ramsay has low suspicion for CVA.  High suspicion for migraine with aura.  He recommends getting an MRI.  If this is negative it is not CVA.  He recommends treating with migraine cocktail.  If she is without resolution of her symptoms treating patient with gabapentin 300 3 times daily for 2 days for paresthesias.   Cardiac Monitoring: The patient was maintained on a cardiac monitor.  I personally viewed and interpreted the cardiac monitored which showed an underlying rhythm of: ***  Social Determinants of Health:  Factors impacting patients care include: ***   Reevaluation: After the interventions noted above, I reevaluated the patient and found that they have :{resolved/improved/worsened:23923::"improved"}  Co morbidities that complicate the patient evaluation  Past Medical History:  Diagnosis Date   Allergy    Asthma    no current problems, does not use inhaler   Diabetes mellitus without complication ([place])    type 2   Essential hypertension    Hyperlipidemia       Dispostion: ***     Final Clinical Impression(s) / ED Diagnoses Final diagnoses:  None     @PCDICTATION @   {Document critical care time when appropriate:1} {Document review of labs and clinical decision tools ie heart score, Chads2Vasc2 etc:1}  {Document your independent review of radiology images, and any outside records:1} {Document your discussion with family members, caretakers, and with consultants:1} {Document social determinants of health affecting pt's care:1} {Document your decision making why or why not admission, treatments were needed:1} Final Clinical Impression(s) / ED Diagnoses Final diagnoses:  None    Rx / DC Orders ED Discharge Orders     None

## 2022-06-06 NOTE — ED Triage Notes (Signed)
Pt bib gcems for blurry vision starting approx 5pm yesterday. Pt reports having previous stroke on 6/28 and received tnk with L sided numbness and weakness. Pt states she noticed a gradual increase in numbness and new blurry vision and was advised by on call neurologist to come to ed.  18g LAC.

## 2022-06-07 ENCOUNTER — Emergency Department (HOSPITAL_COMMUNITY): Payer: Managed Care, Other (non HMO)

## 2022-06-07 LAB — URINALYSIS, ROUTINE W REFLEX MICROSCOPIC
Bilirubin Urine: NEGATIVE
Glucose, UA: >=500 mg/dL — AB
Hgb urine dipstick: NEGATIVE
Ketones, ur: NEGATIVE mg/dL
Leukocytes,Ua: NEGATIVE
Nitrite: NEGATIVE
Protein, ur: NEGATIVE mg/dL
Specific Gravity, Urine: 1.028 (ref 1.005–1.030)
pH: 5 (ref 5.0–8.0)

## 2022-06-07 LAB — DIFFERENTIAL
Abs Immature Granulocytes: 0.03 10*3/uL (ref 0.00–0.07)
Basophils Absolute: 0 10*3/uL (ref 0.0–0.1)
Basophils Relative: 0 %
Eosinophils Absolute: 0.4 10*3/uL (ref 0.0–0.5)
Eosinophils Relative: 4 %
Immature Granulocytes: 0 %
Lymphocytes Relative: 32 %
Lymphs Abs: 3.6 10*3/uL (ref 0.7–4.0)
Monocytes Absolute: 0.4 10*3/uL (ref 0.1–1.0)
Monocytes Relative: 4 %
Neutro Abs: 6.6 10*3/uL (ref 1.7–7.7)
Neutrophils Relative %: 60 %

## 2022-06-07 LAB — COMPREHENSIVE METABOLIC PANEL
ALT: 18 U/L (ref 0–44)
AST: 14 U/L — ABNORMAL LOW (ref 15–41)
Albumin: 3.8 g/dL (ref 3.5–5.0)
Alkaline Phosphatase: 103 U/L (ref 38–126)
Anion gap: 12 (ref 5–15)
BUN: 18 mg/dL (ref 6–20)
CO2: 20 mmol/L — ABNORMAL LOW (ref 22–32)
Calcium: 8.8 mg/dL — ABNORMAL LOW (ref 8.9–10.3)
Chloride: 102 mmol/L (ref 98–111)
Creatinine, Ser: 0.87 mg/dL (ref 0.44–1.00)
GFR, Estimated: >60 mL/min (ref >60)
Glucose, Bld: 386 mg/dL — ABNORMAL HIGH (ref 70–99)
Potassium: 3.8 mmol/L (ref 3.5–5.1)
Sodium: 134 mmol/L — ABNORMAL LOW (ref 135–145)
Total Bilirubin: 0.3 mg/dL (ref 0.3–1.2)
Total Protein: 6.6 g/dL (ref 6.5–8.1)

## 2022-06-07 LAB — PROTIME-INR
INR: 0.9 (ref 0.8–1.2)
Prothrombin Time: 12.2 seconds (ref 11.4–15.2)

## 2022-06-07 LAB — RESP PANEL BY RT-PCR (FLU A&B, COVID) ARPGX2
Influenza A by PCR: NEGATIVE
Influenza B by PCR: NEGATIVE
SARS Coronavirus 2 by RT PCR: NEGATIVE

## 2022-06-07 LAB — RAPID URINE DRUG SCREEN, HOSP PERFORMED
Amphetamines: NOT DETECTED
Barbiturates: NOT DETECTED
Benzodiazepines: NOT DETECTED
Cocaine: NOT DETECTED
Opiates: NOT DETECTED
Tetrahydrocannabinol: NOT DETECTED

## 2022-06-07 LAB — CBC
HCT: 39.9 % (ref 36.0–46.0)
Hemoglobin: 13.5 g/dL (ref 12.0–15.0)
MCH: 29 pg (ref 26.0–34.0)
MCHC: 33.8 g/dL (ref 30.0–36.0)
MCV: 85.8 fL (ref 80.0–100.0)
Platelets: 293 10*3/uL (ref 150–400)
RBC: 4.65 MIL/uL (ref 3.87–5.11)
RDW: 12.9 % (ref 11.5–15.5)
WBC: 11.2 10*3/uL — ABNORMAL HIGH (ref 4.0–10.5)
nRBC: 0 % (ref 0.0–0.2)

## 2022-06-07 LAB — ETHANOL: Alcohol, Ethyl (B): <10 mg/dL (ref <10)

## 2022-06-07 LAB — APTT: aPTT: 29 seconds (ref 24–36)

## 2022-06-07 MED ORDER — GABAPENTIN 300 MG PO CAPS: 300.0000 mg | ORAL_CAPSULE | Freq: Three times a day (TID) | ORAL | 0 refills | Status: DC

## 2022-06-07 MED ORDER — SODIUM CHLORIDE 0.9 % IV BOLUS
1000.0000 mL | Freq: Once | INTRAVENOUS | Status: AC
  Administered 2022-06-07: 1000 mL via INTRAVENOUS

## 2022-06-07 MED ORDER — KETOROLAC TROMETHAMINE 15 MG/ML IJ SOLN
15.0000 mg | Freq: Once | INTRAMUSCULAR | Status: AC
  Administered 2022-06-07: 15 mg via INTRAVENOUS
  Filled 2022-06-07: qty 1

## 2022-06-07 MED ORDER — MAGNESIUM SULFATE 2 GM/50ML IV SOLN
2.0000 g | Freq: Once | INTRAVENOUS | Status: AC
  Administered 2022-06-07: 2 g via INTRAVENOUS
  Filled 2022-06-07: qty 50

## 2022-06-07 MED ORDER — PROCHLORPERAZINE EDISYLATE 10 MG/2ML IJ SOLN
10.0000 mg | Freq: Once | INTRAMUSCULAR | Status: AC
  Administered 2022-06-07: 10 mg via INTRAVENOUS
  Filled 2022-06-07: qty 2

## 2022-06-07 NOTE — Discharge Instructions (Signed)
Your work-up today was overall reassuring.  CT scan of the head as well as MRI of the brain does not show evidence of stroke.  He received migraine cocktail and IV fluids in the emergency room with improvement in symptoms.  I have given you prescription for gabapentin to help with the tingling sensation that you are having.  Take Tylenol, ibuprofen for headaches as needed.  I recommend you call your neurologist Monday to get a follow-up appointment scheduled.  Please return to the emergency room for any worsening symptoms.

## 2022-06-08 ENCOUNTER — Ambulatory Visit (INDEPENDENT_AMBULATORY_CARE_PROVIDER_SITE_OTHER): Payer: Managed Care, Other (non HMO) | Admitting: Family

## 2022-06-08 ENCOUNTER — Encounter: Payer: Self-pay | Admitting: Family

## 2022-06-08 VITALS — BP 139/90 | HR 79 | Temp 98.3°F | Resp 16 | Ht 65.08 in | Wt 198.0 lb

## 2022-06-08 DIAGNOSIS — E1165 Type 2 diabetes mellitus with hyperglycemia: Secondary | ICD-10-CM

## 2022-06-08 DIAGNOSIS — H538 Other visual disturbances: Secondary | ICD-10-CM

## 2022-06-08 DIAGNOSIS — E785 Hyperlipidemia, unspecified: Secondary | ICD-10-CM | POA: Diagnosis not present

## 2022-06-08 DIAGNOSIS — Z8669 Personal history of other diseases of the nervous system and sense organs: Secondary | ICD-10-CM

## 2022-06-08 DIAGNOSIS — I10 Essential (primary) hypertension: Secondary | ICD-10-CM | POA: Diagnosis not present

## 2022-06-08 DIAGNOSIS — Z9189 Other specified personal risk factors, not elsewhere classified: Secondary | ICD-10-CM

## 2022-06-08 DIAGNOSIS — Z09 Encounter for follow-up examination after completed treatment for conditions other than malignant neoplasm: Secondary | ICD-10-CM

## 2022-06-08 DIAGNOSIS — Z0289 Encounter for other administrative examinations: Secondary | ICD-10-CM

## 2022-06-08 MED ORDER — GABAPENTIN 300 MG PO CAPS: 300.0000 mg | ORAL_CAPSULE | Freq: Three times a day (TID) | ORAL | 0 refills | Status: DC

## 2022-06-08 NOTE — Progress Notes (Signed)
Pt presents for transition of care states that she is doing ok but experiencing double vision when she looks at computer screen

## 2022-06-10 LAB — HM DIABETES EYE EXAM

## 2022-06-12 ENCOUNTER — Ambulatory Visit: Payer: Managed Care, Other (non HMO) | Admitting: Family

## 2022-06-17 ENCOUNTER — Encounter: Payer: Self-pay | Admitting: Family Medicine

## 2022-06-17 ENCOUNTER — Ambulatory Visit (INDEPENDENT_AMBULATORY_CARE_PROVIDER_SITE_OTHER): Payer: Managed Care, Other (non HMO) | Admitting: Family Medicine

## 2022-06-17 VITALS — BP 129/88 | HR 70 | Temp 98.1°F | Resp 16 | Wt 204.0 lb

## 2022-06-17 DIAGNOSIS — F419 Anxiety disorder, unspecified: Secondary | ICD-10-CM

## 2022-06-17 DIAGNOSIS — I10 Essential (primary) hypertension: Secondary | ICD-10-CM | POA: Diagnosis not present

## 2022-06-17 DIAGNOSIS — Z8673 Personal history of transient ischemic attack (TIA), and cerebral infarction without residual deficits: Secondary | ICD-10-CM | POA: Diagnosis not present

## 2022-06-17 DIAGNOSIS — E119 Type 2 diabetes mellitus without complications: Secondary | ICD-10-CM | POA: Diagnosis not present

## 2022-06-17 DIAGNOSIS — Z794 Long term (current) use of insulin: Secondary | ICD-10-CM

## 2022-06-17 DIAGNOSIS — F32A Depression, unspecified: Secondary | ICD-10-CM

## 2022-06-17 MED ORDER — BUPROPION HCL ER (XL) 150 MG PO TB24: 150.0000 mg | ORAL_TABLET | Freq: Every day | ORAL | 2 refills | Status: DC

## 2022-06-17 NOTE — Progress Notes (Signed)
Established  Patient Office Visit  Subjective    Patient ID: Jessica Ponce, female    DOB: 11-15-1966  Age: 56 y.o. MRN: [id number]  CC:  Chief Complaint  Patient presents with   Follow-up   Diabetes    HPI [facility] presents for follow up of chronic med issues as well as recent TIA/CVA. Patient reports that she has not been at work because of anxiety and depression as well. She reports increased and severe social stressors.    Outpatient Encounter Medications as of 06/17/2022  Medication Sig   amLODipine (NORVASC) 10 MG tablet Take 1 tablet (10 mg total) by mouth daily.   aspirin EC 81 MG tablet Take 1 tablet (81 mg total) by mouth daily. Swallow whole.   clopidogrel (PLAVIX) 75 MG tablet Take 1 tablet (75 mg total) by mouth daily.   Continuous Blood Gluc Sensor (FREESTYLE LIBRE 2 SENSOR) MISC Utilize as directed to check blood glucose   gabapentin (NEURONTIN) 300 MG capsule Take 1 capsule (300 mg total) by mouth 3 (three) times daily.   Insulin Lispro Prot & Lispro (HUMALOG MIX 75/25 KWIKPEN) (75-25) 100 UNIT/ML Kwikpen Inject 20 Units into the skin 2 (two) times daily with a meal.   Insulin Pen Needle (PEN NEEDLES) 32G X 4 MM MISC 1 application by Does not apply route daily.   Insulin Pen Needle 32G X 4 MM MISC Use as directed with insulin   Insulin Syringe-Needle U-100 (BD SAFETYGLIDE INSULIN SYRINGE) 31G X 15/64" 0.5 ML MISC Use to inject insulin twice daily.   losartan (COZAAR) 100 MG tablet Take 1 tablet (100 mg total) by mouth daily.   rosuvastatin (CRESTOR) 40 MG tablet Take 1 tablet (40 mg total) by mouth daily.   sertraline (ZOLOFT) 100 MG tablet Take 1 tablet (100 mg total) by mouth daily.   Vitamin D, Ergocalciferol, (DRISDOL) 1.25 MG (50000 UNIT) CAPS capsule Take 1 capsule (50,000 Units total) by mouth every 7 (seven) days. (Patient not taking: Reported on 05/27/2022)   [DISCONTINUED] Blood Glucose Monitoring Suppl (ONE TOUCH ULTRA MINI) w/Device KIT Used to check  fasting FSBS daily. Dx: E11.9   [DISCONTINUED] DM-Doxylamine-Acetaminophen (NYQUIL COLD & FLU PO) Take 2 tablets by mouth at bedtime as needed (cold symptoms).   [DISCONTINUED] glucose blood (ONETOUCH ULTRA) test strip Check sugars twice daily. Dx: E11.9   [DISCONTINUED] Lancets (ONETOUCH ULTRASOFT) lancets Use to check fasting FSBS daily. Dx: E11.9   No facility-administered encounter medications on file as of 06/17/2022.    Past Medical History:  Diagnosis Date   Allergy    Asthma    no current problems, does not use inhaler   Diabetes mellitus without complication ([place])    type 2   Essential hypertension    Hyperlipidemia     Past Surgical History:  Procedure Laterality Date   APPENDECTOMY  1985   exploratory laparoscopy     WISDOM TOOTH EXTRACTION      Family History  Problem Relation Age of Onset   Memory loss Mother    Breast cancer Mother 84   Breast cancer Maternal Aunt 74   Colon cancer Neg Hx    Rectal cancer Neg Hx    Stomach cancer Neg Hx    Esophageal cancer Neg Hx     Social History   Socioeconomic History   Marital status: Married    Spouse name: Legrand Como   Number of children: 2   Years of education: 16   Highest education level: Not on  file  Occupational History   Not on file  Tobacco Use   Smoking status: Never    Passive exposure: Never   Smokeless tobacco: Never  Vaping Use   Vaping Use: Never used  Substance and Sexual Activity   Alcohol use: Yes    Alcohol/week: 0.0 standard drinks of alcohol    Comment: occasional wine   Drug use: No   Sexual activity: Yes    Birth control/protection: Post-menopausal  Other Topics Concern   Not on file  Social History Narrative   Lives with husband and 2 kids   Caffeine use: Drinks 2-3 cups coffee per day   Social Determinants of Health   Financial Resource Strain: Not on file  Food Insecurity: Not on file  Transportation Needs: Not on file  Physical Activity: Not on file  Stress: Not on file   Social Connections: Not on file  Intimate Partner Violence: Not on file    Review of Systems  All other systems reviewed and are negative.       Objective    BP 129/88   Pulse 70   Temp 98.1 F (36.7 C) (Oral)   Resp 16   Wt 204 lb (92.5 kg)   LMP 12/10/2015 (LMP Unknown)   SpO2 94%   BMI 33.87 kg/m   Physical Exam Vitals and nursing note reviewed.  Constitutional:      General: She is not in acute distress. Cardiovascular:     Rate and Rhythm: Normal rate and regular rhythm.  Pulmonary:     Effort: Pulmonary effort is normal.     Breath sounds: Normal breath sounds.  Abdominal:     Palpations: Abdomen is soft.     Tenderness: There is no abdominal tenderness.  Musculoskeletal:     Right lower leg: No edema.     Left lower leg: No edema.  Neurological:     General: No focal deficit present.     Mental Status: She is alert and oriented to person, place, and time.  Psychiatric:        Mood and Affect: Mood normal.        Behavior: Behavior normal.         Assessment & Plan:   1. Type 2 diabetes mellitus without complication, with long-term current use of insulin (HCC) Elevated A1c and not near goal. Discussed compliance.   2. Hypertension, essential Appears stable with present management. Continue and monitor  3. Status post CVA Improving with minimal residual. Management per consultant.   4. Anxiety and depression Continue zoloft and add wellbutrin 150 mg daily to regimen and monitor  No follow-ups on file.   Becky Sax, MD

## 2022-06-17 NOTE — Progress Notes (Signed)
Patient is here for f/u visit post TIA 05/27/2022. Patient c/o numbness (feet,face)and poor eyesight since [place]. [place] Office Visit from 06/17/2022 in Primary Care at [facility]  PHQ-9 Total Score 11          06/17/2022    9:41 AM 02/11/2021   10:52 AM 12/25/2020    9:51 AM 04/19/2020   10:03 AM  GAD 7 : Generalized Anxiety Score  Nervous, Anxious, on Edge '2 1 1 1  '$ Control/stop worrying 2 0 1 1  Worry too much - different things '2 1 1 1  '$ Trouble relaxing 2 0 1 1  Restless 1 0 0 0  Easily annoyed or irritable 1 0 0 0  Afraid - awful might happen 2 0 1 0  Total GAD 7 Score '12 2 5 4  '$ Anxiety Difficulty Very difficult  Somewhat difficult

## 2022-06-25 ENCOUNTER — Inpatient Hospital Stay: Payer: Managed Care, Other (non HMO) | Admitting: Family

## 2022-07-07 ENCOUNTER — Inpatient Hospital Stay: Payer: Managed Care, Other (non HMO) | Admitting: Family Medicine

## 2022-07-08 ENCOUNTER — Encounter: Payer: Self-pay | Admitting: Adult Health

## 2022-07-08 ENCOUNTER — Ambulatory Visit (INDEPENDENT_AMBULATORY_CARE_PROVIDER_SITE_OTHER): Payer: Managed Care, Other (non HMO) | Admitting: Adult Health

## 2022-07-08 VITALS — BP 124/79 | HR 74 | Ht 65.0 in | Wt 207.0 lb

## 2022-07-08 DIAGNOSIS — Z8673 Personal history of transient ischemic attack (TIA), and cerebral infarction without residual deficits: Secondary | ICD-10-CM | POA: Diagnosis not present

## 2022-07-08 DIAGNOSIS — G4733 Obstructive sleep apnea (adult) (pediatric): Secondary | ICD-10-CM | POA: Diagnosis not present

## 2022-07-08 DIAGNOSIS — R2990 Unspecified symptoms and signs involving the nervous system: Secondary | ICD-10-CM

## 2022-07-08 DIAGNOSIS — Z9989 Dependence on other enabling machines and devices: Secondary | ICD-10-CM

## 2022-07-08 DIAGNOSIS — E1165 Type 2 diabetes mellitus with hyperglycemia: Secondary | ICD-10-CM

## 2022-07-08 DIAGNOSIS — E782 Mixed hyperlipidemia: Secondary | ICD-10-CM

## 2022-07-08 NOTE — Progress Notes (Signed)
PATIENT: Jessica Ponce DOB: 1965-12-29  REASON FOR VISIT: follow up HISTORY FROM: patient PRIMARY NEUROLOGIST: Dr. Leonie Man Dr. Rexene Alberts   HISTORY OF PRESENT ILLNESS: Today 07/08/22: Jessica Ponce is a 56 year old female with a history of stroke.  She returns today for follow-up.Reports that symptoms started with face going numb and then the arm. She felt her left leg get weak. initially went to urgent care. By the time she got to the hospital she could not left left leg. Received TNK. She reports that she still gets some numbness in the face. Reports that it comes and goes. Reports that left arm feels different since stroke but no weakness. Reports that since the stroke left big toe is numb and sometimes right big toe as well.   Reports that she sees in PCP on Monday and they will recheck blood work she thinks. Reports blood sugars are all over the place. Reports Average is 193.  Remains on Crestor for her cholesterol.  She states that she has stopped the Plavix but she also stopped aspirin.  She did not realize she should continue aspirin.  Patient reports that she is under a lot of stress.  Reports that her daughter is wanting to transition to a female  Patient has been seeing Dr. Rexene Alberts for sleep apnea reports that she has not been using her CPAP machine because something is wrong with the water chamber.  She has not taken it to her DME company  HISTORY Copied from Dr. Erlinda Hong notes: Ms. Jessica Ponce is a 56 y.o. female with history of HTN, HLD, DM and OSA presenting with acute onset left sided numbness and left leg drift.  She presented to the ED and was given TNK.  Symptoms have since improved, but some residual diminished sensation on the left side remains, along with slowed fine motor movements in the left hand (patient is left handed).  Encouraged knitting and other activities using fine motor function to improve deficits.  MRI pending, to be performed around 1900 tonight.  Of note, patient states that she  has been unable to do the things she needs to due to control her diabetes and hyperlipidemia due to recent loss of health insurance.     REVIEW OF SYSTEMS: Out of a complete 14 system review of symptoms, the patient complains only of the following symptoms, and all other reviewed systems are negative.  See HPI  ALLERGIES: Allergies  Allergen Reactions   Valsartan Diarrhea   Atorvastatin Other (See Comments)    Elevated sugars   Codeine Nausea And Vomiting   Metformin And Related Diarrhea    HOME MEDICATIONS: Outpatient Medications Prior to Visit  Medication Sig Dispense Refill   amLODipine (NORVASC) 10 MG tablet Take 1 tablet (10 mg total) by mouth daily. 90 tablet 1   buPROPion (WELLBUTRIN XL) 150 MG 24 hr tablet Take 1 tablet (150 mg total) by mouth daily. 30 tablet 2   Continuous Blood Gluc Sensor (FREESTYLE LIBRE 2 SENSOR) MISC Utilize as directed to check blood glucose 2 each 5   gabapentin (NEURONTIN) 300 MG capsule Take 1 capsule (300 mg total) by mouth 3 (three) times daily. 90 capsule 0   Insulin Lispro Prot & Lispro (HUMALOG MIX 75/25 KWIKPEN) (75-25) 100 UNIT/ML Kwikpen Inject 20 Units into the skin 2 (two) times daily with a meal. 15 mL 11   Insulin Pen Needle (PEN NEEDLES) 32G X 4 MM MISC 1 application by Does not apply route daily. 100 each 3  Insulin Pen Needle 32G X 4 MM MISC Use as directed with insulin 100 each 0   Insulin Syringe-Needle U-100 (BD SAFETYGLIDE INSULIN SYRINGE) 31G X 15/64" 0.5 ML MISC Use to inject insulin twice daily. 100 each 0   losartan (COZAAR) 100 MG tablet Take 1 tablet (100 mg total) by mouth daily. 90 tablet 1   rosuvastatin (CRESTOR) 40 MG tablet Take 1 tablet (40 mg total) by mouth daily. 30 tablet 1   sertraline (ZOLOFT) 100 MG tablet Take 1 tablet (100 mg total) by mouth daily. 90 tablet 1   aspirin EC 81 MG tablet Take 1 tablet (81 mg total) by mouth daily. Swallow whole. 30 tablet 12   clopidogrel (PLAVIX) 75 MG tablet Take 1 tablet  (75 mg total) by mouth daily. 30 tablet 1   Vitamin D, Ergocalciferol, (DRISDOL) 1.25 MG (50000 UNIT) CAPS capsule Take 1 capsule (50,000 Units total) by mouth every 7 (seven) days. (Patient not taking: Reported on 05/27/2022) 12 capsule 0   No facility-administered medications prior to visit.    PAST MEDICAL HISTORY: Past Medical History:  Diagnosis Date   Allergy    Asthma    no current problems, does not use inhaler   Diabetes mellitus without complication ([place])    type 2   Essential hypertension    Hyperlipidemia     PAST SURGICAL HISTORY: Past Surgical History:  Procedure Laterality Date   APPENDECTOMY  1985   exploratory laparoscopy     WISDOM TOOTH EXTRACTION      FAMILY HISTORY: Family History  Problem Relation Age of Onset   Memory loss Mother    Breast cancer Mother 53   Stroke Father    Breast cancer Maternal Aunt 74   Colon cancer Neg Hx    Rectal cancer Neg Hx    Stomach cancer Neg Hx    Esophageal cancer Neg Hx     SOCIAL HISTORY: Social History   Socioeconomic History   Marital status: Married    Spouse name: Legrand Como   Number of children: 2   Years of education: 16   Highest education level: Not on file  Occupational History   Not on file  Tobacco Use   Smoking status: Never    Passive exposure: Never   Smokeless tobacco: Never  Vaping Use   Vaping Use: Never used  Substance and Sexual Activity   Alcohol use: Yes    Alcohol/week: 0.0 standard drinks of alcohol    Comment: occasional wine   Drug use: No   Sexual activity: Yes    Birth control/protection: Post-menopausal  Other Topics Concern   Not on file  Social History Narrative   Lives with husband and 2 kids   Caffeine use: Drinks 2-3 cups coffee per day   Social Determinants of Health   Financial Resource Strain: Not on file  Food Insecurity: Not on file  Transportation Needs: Not on file  Physical Activity: Not on file  Stress: Not on file  Social Connections: Not on file   Intimate Partner Violence: Not on file      PHYSICAL EXAM  Vitals:   07/08/22 0843  BP: 124/79  Pulse: 74  Weight: 207 lb (93.9 kg)  Height: '5\' 5"'$  (1.651 m)   Body mass index is 34.45 kg/m.  Generalized: Well developed, in no acute distress   Neurological examination  Mentation: Alert oriented to time, place, history taking. Follows all commands speech and language fluent Cranial nerve II-XII: Pupils were equal round  reactive to light. Extraocular movements were full, visual field were full on confrontational test. Facial sensation and strength were normal. Head turning and shoulder shrug  were normal and symmetric. Motor: The motor testing reveals 5 over 5 strength of all 4 extremities. Good symmetric motor tone is noted throughout.  Sensory: Sensory testing is intact to soft touch on all 4 extremities. No evidence of extinction is noted.  Coordination: Cerebellar testing reveals good finger-nose-finger and heel-to-shin bilaterally.  Gait and station: Gait is normal.  Reflexes: Deep tendon reflexes are symmetric and normal bilaterally.   DIAGNOSTIC DATA (LABS, IMAGING, TESTING) - I reviewed patient records, labs, notes, testing and imaging myself where available.  Lab Results  Component Value Date   WBC 11.2 (H) 06/06/2022   HGB 13.6 06/06/2022   HCT 40.0 06/06/2022   MCV 85.8 06/06/2022   PLT 293 06/06/2022      Component Value Date/Time   NA 138 06/06/2022 2339   NA 140 06/26/2021 0921   K 4.2 06/06/2022 2339   CL 104 06/06/2022 2339   CO2 20 (L) 06/06/2022 2319   GLUCOSE 399 (H) 06/06/2022 2339   BUN 23 (H) 06/06/2022 2339   BUN 11 06/26/2021 0921   CREATININE 0.70 06/06/2022 2339   CALCIUM 8.8 (L) 06/06/2022 2319   PROT 6.6 06/06/2022 2319   PROT 7.3 06/26/2021 0921   ALBUMIN 3.8 06/06/2022 2319   ALBUMIN 4.5 06/26/2021 0921   AST 14 (L) 06/06/2022 2319   ALT 18 06/06/2022 2319   ALKPHOS 103 06/06/2022 2319   BILITOT 0.3 06/06/2022 2319   BILITOT 0.3  06/26/2021 0921   GFRNONAA >60 06/06/2022 2319   GFRAA 97 04/19/2020 1025   Lab Results  Component Value Date   CHOL 343 (H) 05/28/2022   HDL 50 05/28/2022   LDLCALC 237 (H) 05/28/2022   TRIG 281 (H) 05/28/2022   CHOLHDL 6.9 05/28/2022   Lab Results  Component Value Date   HGBA1C 11.9 (H) 05/28/2022   Lab Results  Component Value Date   VITAMINB12 623 05/27/2022   Lab Results  Component Value Date   TSH 3.713 05/27/2022      ASSESSMENT AND PLAN 55 y.o. year old female  has a past medical history of Allergy, Asthma, Diabetes mellitus without complication ([place]), Essential hypertension, and Hyperlipidemia. here with:  Stroke  OSA on CPAP Uncontrolled Diabetes Hyperlipidemia   Restart aspirin 81 mg daily  for secondary stroke prevention.  Discussed secondary stroke prevention measures and importance of close PCP follow up for aggressive stroke risk factor management. I have gone over the pathophysiology of stroke, warning signs and symptoms, risk factors and their management in some detail with instructions to go to the closest emergency room for symptoms of concern. HTN: BP goal <130/90.   HLD: LDL goal <70. Recent LDL 237. On Crestor  DMII: A1c goal<7.0. Recent A1c 11.9.  Encouraged patient to monitor diet and encouraged exercise Restart CPAP therapy- take machine to have it serviced FU with our office 6 months       Ward Givens, MSN, NP-C 07/08/2022, 9:15 AM [facility] [address] [place], [place] [address] [phone]

## 2022-07-08 NOTE — Patient Instructions (Addendum)
  Restart ASA 81 mg  Call DME company about machine P: [phone] Your stroke risk factors:  Blood pressure: BP goal <130/90.   Cholesterol: LDL goal <70. Recent LDL 237. On Crestor  Diabetes: A1c goal<7.0. Recent A1c 11.9.    If you develop any strokelike symptoms please call 911 or go to the ED

## 2022-07-09 ENCOUNTER — Other Ambulatory Visit: Payer: Self-pay | Admitting: Family Medicine

## 2022-07-13 ENCOUNTER — Ambulatory Visit (INDEPENDENT_AMBULATORY_CARE_PROVIDER_SITE_OTHER): Payer: Managed Care, Other (non HMO) | Admitting: Family Medicine

## 2022-07-13 ENCOUNTER — Encounter: Payer: Self-pay | Admitting: Family Medicine

## 2022-07-13 VITALS — BP 123/83 | HR 74 | Temp 97.7°F | Resp 16 | Ht 65.0 in | Wt 206.8 lb

## 2022-07-13 DIAGNOSIS — I10 Essential (primary) hypertension: Secondary | ICD-10-CM | POA: Diagnosis not present

## 2022-07-13 DIAGNOSIS — Z8673 Personal history of transient ischemic attack (TIA), and cerebral infarction without residual deficits: Secondary | ICD-10-CM

## 2022-07-13 DIAGNOSIS — Z1231 Encounter for screening mammogram for malignant neoplasm of breast: Secondary | ICD-10-CM

## 2022-07-13 DIAGNOSIS — I639 Cerebral infarction, unspecified: Secondary | ICD-10-CM

## 2022-07-13 DIAGNOSIS — Z0289 Encounter for other administrative examinations: Secondary | ICD-10-CM

## 2022-07-13 DIAGNOSIS — E1165 Type 2 diabetes mellitus with hyperglycemia: Secondary | ICD-10-CM

## 2022-07-13 DIAGNOSIS — Z794 Long term (current) use of insulin: Secondary | ICD-10-CM

## 2022-07-13 MED ORDER — INSULIN LISPRO PROT & LISPRO (75-25 MIX) 100 UNIT/ML KWIKPEN: 20.0000 [IU] | PEN_INJECTOR | Freq: Two times a day (BID) | SUBCUTANEOUS | 11 refills | Status: DC

## 2022-07-15 ENCOUNTER — Encounter: Payer: Self-pay | Admitting: Family Medicine

## 2022-07-15 ENCOUNTER — Telehealth: Payer: Self-pay | Admitting: *Deleted

## 2022-07-15 NOTE — Telephone Encounter (Signed)
Order need to be change please   Copied from [place] [phone]. Topic: General - Other >> Jul 15, 2022 11:06 AM Tiffany B wrote: Reason for CRM: [facility] contacted patient and informed her order has to reflect "Diagnostic"  mamo. Patient would like a follow up call when order has been resent.

## 2022-07-15 NOTE — Progress Notes (Signed)
Established Patient Office Visit  Subjective    Patient ID: Jessica Ponce, female    DOB: Jul 15, 1966  Age: 56 y.o. MRN: [id number]  CC:  Chief Complaint  Patient presents with   papers    HPI Jessica Ponce presents for completion of forms related to her recent TIA/stroke  and review of chronic med issues. Patient denies acute complaints or concerns.    Outpatient Encounter Medications as of 07/13/2022  Medication Sig   amLODipine (NORVASC) 10 MG tablet Take 1 tablet (10 mg total) by mouth daily.   aspirin EC 81 MG tablet Take 1 tablet (81 mg total) by mouth daily. Swallow whole.   buPROPion (WELLBUTRIN XL) 150 MG 24 hr tablet TAKE 1 TABLET BY MOUTH EVERY DAY   Continuous Blood Gluc Sensor (FREESTYLE LIBRE 2 SENSOR) MISC Utilize as directed to check blood glucose   Insulin Pen Needle (PEN NEEDLES) 32G X 4 MM MISC 1 application by Does not apply route daily.   Insulin Pen Needle 32G X 4 MM MISC Use as directed with insulin   Insulin Syringe-Needle U-100 (BD SAFETYGLIDE INSULIN SYRINGE) 31G X 15/64" 0.5 ML MISC Use to inject insulin twice daily.   losartan (COZAAR) 100 MG tablet Take 1 tablet (100 mg total) by mouth daily.   rosuvastatin (CRESTOR) 40 MG tablet Take 1 tablet (40 mg total) by mouth daily.   sertraline (ZOLOFT) 100 MG tablet Take 1 tablet (100 mg total) by mouth daily.   [DISCONTINUED] Insulin Lispro Prot & Lispro (HUMALOG MIX 75/25 KWIKPEN) (75-25) 100 UNIT/ML Kwikpen Inject 20 Units into the skin 2 (two) times daily with a meal.   gabapentin (NEURONTIN) 300 MG capsule Take 1 capsule (300 mg total) by mouth 3 (three) times daily.   Insulin Lispro Prot & Lispro (HUMALOG MIX 75/25 KWIKPEN) (75-25) 100 UNIT/ML Kwikpen Inject 20 Units into the skin 2 (two) times daily with a meal.   [DISCONTINUED] clopidogrel (PLAVIX) 75 MG tablet Take 1 tablet (75 mg total) by mouth daily.   [DISCONTINUED] Vitamin D, Ergocalciferol, (DRISDOL) 1.25 MG (50000 UNIT) CAPS capsule Take 1 capsule  (50,000 Units total) by mouth every 7 (seven) days.   No facility-administered encounter medications on file as of 07/13/2022.    Past Medical History:  Diagnosis Date   Allergy    Asthma    no current problems, does not use inhaler   Diabetes mellitus without complication ([place])    type 2   Essential hypertension    Hyperlipidemia     Past Surgical History:  Procedure Laterality Date   APPENDECTOMY  1985   exploratory laparoscopy     WISDOM TOOTH EXTRACTION      Family History  Problem Relation Age of Onset   Memory loss Mother    Breast cancer Mother 4   Stroke Father    Breast cancer Maternal Aunt 74   Colon cancer Neg Hx    Rectal cancer Neg Hx    Stomach cancer Neg Hx    Esophageal cancer Neg Hx     Social History   Socioeconomic History   Marital status: Married    Spouse name: Legrand Como   Number of children: 2   Years of education: 16   Highest education level: Not on file  Occupational History   Not on file  Tobacco Use   Smoking status: Never    Passive exposure: Never   Smokeless tobacco: Never  Vaping Use   Vaping Use: Never used  Substance and Sexual  Activity   Alcohol use: Yes    Alcohol/week: 0.0 standard drinks of alcohol    Comment: occasional wine   Drug use: No   Sexual activity: Yes    Birth control/protection: Post-menopausal  Other Topics Concern   Not on file  Social History Narrative   Lives with husband and 2 kids   Caffeine use: Drinks 2-3 cups coffee per day   Social Determinants of Health   Financial Resource Strain: Not on file  Food Insecurity: Not on file  Transportation Needs: Not on file  Physical Activity: Not on file  Stress: Not on file  Social Connections: Not on file  Intimate Partner Violence: Not on file    Review of Systems  All other systems reviewed and are negative.       Objective    BP 123/83   Pulse 74   Temp 97.7 F (36.5 C) (Oral)   Resp 16   Ht '5\' 5"'$  (1.651 m)   Wt 206 lb 12.8 oz  (93.8 kg)   LMP 12/10/2015 (LMP Unknown)   SpO2 93%   BMI 34.41 kg/m   Physical Exam Vitals and nursing note reviewed.  Constitutional:      General: She is not in acute distress. HENT:     Head: Normocephalic and atraumatic.  Cardiovascular:     Rate and Rhythm: Normal rate and regular rhythm.  Pulmonary:     Effort: Pulmonary effort is normal.     Breath sounds: Normal breath sounds.  Abdominal:     Palpations: Abdomen is soft.     Tenderness: There is no abdominal tenderness.  Musculoskeletal:     Right lower leg: No edema.     Left lower leg: No edema.  Neurological:     General: No focal deficit present.     Mental Status: She is alert and oriented to person, place, and time.  Psychiatric:        Mood and Affect: Mood normal.        Behavior: Behavior normal.         Assessment & Plan:   1. Hypertension, essential Appears stable. Continue and monitor  2. History of CVA (cerebrovascular accident) Sx appear much improved/resolved  3. Encounter for completion of form with patient Form completed for disability and rtw  4. Type 2 diabetes mellitus with hyperglycemia, with long-term current use of insulin (HCC) Most recent A1c was far above goal  5. Encounter for screening mammogram for malignant neoplasm of breast  - MM Digital Diagnostic Bilat; Future    No follow-ups on file.   Becky Sax, MD

## 2022-07-24 ENCOUNTER — Telehealth: Payer: Self-pay | Admitting: Family Medicine

## 2022-07-24 NOTE — Telephone Encounter (Signed)
Pt called in for assistance. Pt says that she need to have her mammogram order updated for scheduling. Pt says that she is suppose to have a diagnostic mammogram instead of what is currently in the system.   Please assist pt further.   CB: [phone]

## 2022-07-27 ENCOUNTER — Other Ambulatory Visit: Payer: Self-pay | Admitting: Family Medicine

## 2022-07-27 NOTE — Telephone Encounter (Signed)
Provider will resend referral

## 2022-07-27 NOTE — Telephone Encounter (Signed)
Medication Refill - Medication: sertraline (ZOLOFT) 100 MG tablet  Has the patient contacted their pharmacy? Yes.   No, more refills.   (Agent: If yes, when and what did the pharmacy advise?)  Preferred Pharmacy (with phone number or street name):  [id number] Phone: [phone]: [phone] Hours: Not open 24 hours   Has the patient been seen for an appointment in the last year OR does the patient have an upcoming appointment? Yes.    Agent: Please be advised that RX refills may take up to 3 business days. We ask that you follow-up with your pharmacy.

## 2022-07-28 ENCOUNTER — Other Ambulatory Visit: Payer: Self-pay | Admitting: Family Medicine

## 2022-07-28 DIAGNOSIS — Z803 Family history of malignant neoplasm of breast: Secondary | ICD-10-CM

## 2022-07-28 DIAGNOSIS — Z1231 Encounter for screening mammogram for malignant neoplasm of breast: Secondary | ICD-10-CM

## 2022-07-28 MED ORDER — SERTRALINE HCL 100 MG PO TABS: 100.0000 mg | ORAL_TABLET | Freq: Every day | ORAL | 1 refills | Status: DC

## 2022-07-28 NOTE — Telephone Encounter (Signed)
Requested Prescriptions  Pending Prescriptions Disp Refills  . sertraline (ZOLOFT) 100 MG tablet 90 tablet 1    Sig: Take 1 tablet (100 mg total) by mouth daily.     Psychiatry:  Antidepressants - SSRI - sertraline Failed - 07/27/2022 12:03 PM      Failed - AST in normal range and within 360 days    AST  Date Value Ref Range Status  06/06/2022 14 (L) 15 - 41 U/L Final         Passed - ALT in normal range and within 360 days    ALT  Date Value Ref Range Status  06/06/2022 18 0 - 44 U/L Final         Passed - Completed PHQ-2 or PHQ-9 in the last 360 days      Passed - Valid encounter within last 6 months    Recent Outpatient Visits          2 weeks ago Hypertension, essential   Primary Care at [facility], Clyde Canterbury, MD   1 month ago Type 2 diabetes mellitus without complication, with long-term current use of insulin [facility])   Primary Care at [facility], MD   1 month ago Hospital discharge follow-up   Primary Care at [facility], Amy J, NP   6 months ago Type 2 diabetes mellitus without complication, with long-term current use of insulin [facility])   Primary Care at [place], MD   9 months ago Type 2 diabetes mellitus without complication, with long-term current use of insulin [facility])   Primary Care at [facility], MD      Future Appointments            In 2 months Dorna Mai, MD Primary Care at [facility]   In 2 months Ward Givens, [facility]

## 2022-08-13 ENCOUNTER — Other Ambulatory Visit: Payer: Self-pay | Admitting: Family

## 2022-08-13 ENCOUNTER — Encounter: Payer: Self-pay | Admitting: Family Medicine

## 2022-08-13 ENCOUNTER — Ambulatory Visit (INDEPENDENT_AMBULATORY_CARE_PROVIDER_SITE_OTHER): Payer: Managed Care, Other (non HMO) | Admitting: Family Medicine

## 2022-08-13 VITALS — BP 138/91 | HR 74 | Temp 98.1°F | Resp 16 | Wt 205.2 lb

## 2022-08-13 DIAGNOSIS — I10 Essential (primary) hypertension: Secondary | ICD-10-CM | POA: Diagnosis not present

## 2022-08-13 DIAGNOSIS — K219 Gastro-esophageal reflux disease without esophagitis: Secondary | ICD-10-CM | POA: Diagnosis not present

## 2022-08-13 DIAGNOSIS — F32A Depression, unspecified: Secondary | ICD-10-CM

## 2022-08-13 DIAGNOSIS — Z0289 Encounter for other administrative examinations: Secondary | ICD-10-CM

## 2022-08-13 DIAGNOSIS — F419 Anxiety disorder, unspecified: Secondary | ICD-10-CM

## 2022-08-13 DIAGNOSIS — E1165 Type 2 diabetes mellitus with hyperglycemia: Secondary | ICD-10-CM

## 2022-08-13 MED ORDER — OMEPRAZOLE 20 MG PO CPDR: 20.0000 mg | DELAYED_RELEASE_CAPSULE | Freq: Every day | ORAL | 1 refills | Status: DC

## 2022-08-13 MED ORDER — SERTRALINE HCL 100 MG PO TABS: 100.0000 mg | ORAL_TABLET | Freq: Every day | ORAL | 1 refills | Status: DC

## 2022-08-15 ENCOUNTER — Telehealth: Payer: Self-pay | Admitting: *Deleted

## 2022-08-15 ENCOUNTER — Other Ambulatory Visit: Payer: Self-pay | Admitting: Family Medicine

## 2022-08-15 ENCOUNTER — Encounter: Payer: Self-pay | Admitting: Family Medicine

## 2022-08-15 ENCOUNTER — Encounter: Payer: Self-pay | Admitting: *Deleted

## 2022-08-15 DIAGNOSIS — N644 Mastodynia: Secondary | ICD-10-CM

## 2022-08-15 NOTE — Telephone Encounter (Signed)
Please call [facility] at [phone] to schedule your appointment

## 2022-08-15 NOTE — Progress Notes (Signed)
Established Patient Office Visit  Subjective    Patient ID: Jessica Ponce, female    DOB: 18-Jan-1966  Age: 56 y.o. MRN: [id number]  CC:  Chief Complaint  Patient presents with   Follow-up    paperwork    HPI Jessica Ponce presents for follow up of chronic med issues. She also wants completion of a form for work for accommodations since her recent stroke.    Outpatient Encounter Medications as of 08/13/2022  Medication Sig   amLODipine (NORVASC) 10 MG tablet Take 1 tablet (10 mg total) by mouth daily.   aspirin EC 81 MG tablet Take 1 tablet (81 mg total) by mouth daily. Swallow whole.   buPROPion (WELLBUTRIN XL) 150 MG 24 hr tablet TAKE 1 TABLET BY MOUTH EVERY DAY   Continuous Blood Gluc Sensor (FREESTYLE LIBRE 2 SENSOR) MISC Utilize as directed to check blood glucose   Insulin Lispro Prot & Lispro (HUMALOG MIX 75/25 KWIKPEN) (75-25) 100 UNIT/ML Kwikpen Inject 20 Units into the skin 2 (two) times daily with a meal.   Insulin Pen Needle (PEN NEEDLES) 32G X 4 MM MISC 1 application by Does not apply route daily.   Insulin Pen Needle 32G X 4 MM MISC Use as directed with insulin   Insulin Syringe-Needle U-100 (BD SAFETYGLIDE INSULIN SYRINGE) 31G X 15/64" 0.5 ML MISC Use to inject insulin twice daily.   losartan (COZAAR) 100 MG tablet Take 1 tablet (100 mg total) by mouth daily.   omeprazole (PRILOSEC) 20 MG capsule Take 1 capsule (20 mg total) by mouth daily.   rosuvastatin (CRESTOR) 40 MG tablet Take 1 tablet (40 mg total) by mouth daily.   [DISCONTINUED] sertraline (ZOLOFT) 100 MG tablet Take 1 tablet (100 mg total) by mouth daily.   sertraline (ZOLOFT) 100 MG tablet Take 1 tablet (100 mg total) by mouth daily.   [DISCONTINUED] gabapentin (NEURONTIN) 300 MG capsule Take 1 capsule (300 mg total) by mouth 3 (three) times daily.   No facility-administered encounter medications on file as of 08/13/2022.    Past Medical History:  Diagnosis Date   Allergy    Asthma    no current problems,  does not use inhaler   Diabetes mellitus without complication ([place])    type 2   Essential hypertension    Hyperlipidemia     Past Surgical History:  Procedure Laterality Date   APPENDECTOMY  1985   exploratory laparoscopy     WISDOM TOOTH EXTRACTION      Family History  Problem Relation Age of Onset   Memory loss Mother    Breast cancer Mother 61   Stroke Father    Breast cancer Maternal Aunt 74   Colon cancer Neg Hx    Rectal cancer Neg Hx    Stomach cancer Neg Hx    Esophageal cancer Neg Hx     Social History   Socioeconomic History   Marital status: Married    Spouse name: Legrand Como   Number of children: 2   Years of education: 16   Highest education level: Not on file  Occupational History   Not on file  Tobacco Use   Smoking status: Never    Passive exposure: Never   Smokeless tobacco: Never  Vaping Use   Vaping Use: Never used  Substance and Sexual Activity   Alcohol use: Yes    Alcohol/week: 0.0 standard drinks of alcohol    Comment: occasional wine   Drug use: No   Sexual activity: Yes  Birth control/protection: Post-menopausal  Other Topics Concern   Not on file  Social History Narrative   Lives with husband and 2 kids   Caffeine use: Drinks 2-3 cups coffee per day   Social Determinants of Health   Financial Resource Strain: Not on file  Food Insecurity: Not on file  Transportation Needs: Not on file  Physical Activity: Not on file  Stress: Not on file  Social Connections: Not on file  Intimate Partner Violence: Not on file    Review of Systems  All other systems reviewed and are negative.       Objective    BP (!) 138/91   Pulse 74   Temp 98.1 F (36.7 C) (Oral)   Resp 16   Wt 205 lb 3.2 oz (93.1 kg)   LMP 12/10/2015 (LMP Unknown)   SpO2 97%   BMI 34.15 kg/m   Physical Exam Vitals and nursing note reviewed.  Constitutional:      General: She is not in acute distress. HENT:     Head: Normocephalic and atraumatic.   Cardiovascular:     Rate and Rhythm: Normal rate and regular rhythm.  Pulmonary:     Effort: Pulmonary effort is normal.     Breath sounds: Normal breath sounds.  Abdominal:     Palpations: Abdomen is soft.     Tenderness: There is no abdominal tenderness.  Musculoskeletal:     Right lower leg: No edema.     Left lower leg: No edema.  Neurological:     General: No focal deficit present.     Mental Status: She is alert and oriented to person, place, and time.  Psychiatric:        Mood and Affect: Mood normal.        Behavior: Behavior normal.         Assessment & Plan:   1. Hypertension, essential Slightly elevated reading. Discussed compliance. Will continue and monitor  2. Gastroesophageal reflux disease without esophagitis Continue. Meds refilled.   3. Anxiety and depression Appears stable. Continue and monitor. Meds refilled.   4. Encounter for completion of form with patient Form completed  No follow-ups on file.   Becky Sax, MD

## 2022-09-02 ENCOUNTER — Other Ambulatory Visit: Payer: Self-pay

## 2022-09-02 NOTE — Patient Outreach (Signed)
  Care Coordination   09/02/2022 Name: Jessica Ponce MRN: [id number] DOB: 1966-07-17   First telephone outreach attempt to obtain mRS. No answer. Left message for returned call.  Philmore Pali [facility] Management Assistant [phone]

## 2022-09-07 ENCOUNTER — Other Ambulatory Visit: Payer: Self-pay

## 2022-09-07 NOTE — Patient Outreach (Signed)
  Care Coordination   09/07/2022 Name: Jessica Ponce MRN: [id number] DOB: 12-Sep-1966   Second telephone outreach attempt to obtain mRS. No answer. Left message for returned call.  Philmore Pali [facility] Management Assistant [phone]

## 2022-09-11 ENCOUNTER — Other Ambulatory Visit: Payer: Self-pay

## 2022-09-11 NOTE — Patient Outreach (Signed)
   3 outreach attempts were completed to obtain mRs. mRs could not be obtained because patient never returned my calls. mRs=7    Giulia Hickey Care Management Assistant [phone]  

## 2022-10-05 ENCOUNTER — Other Ambulatory Visit: Payer: Self-pay | Admitting: Family Medicine

## 2022-10-05 NOTE — Telephone Encounter (Signed)
Medication Refill - Medication: Continuous Blood Gluc Sensor (FREESTYLE LIBRE 2 SENSOR) MISC   Has the patient contacted their pharmacy? Yes.   Pt told to contact provider  Preferred Pharmacy (with phone number or street name):  [id number] [place]: [phone] Fax: [phone]    Has the patient been seen for an appointment in the last year OR does the patient have an upcoming appointment? Yes.    Agent: Please be advised that RX refills may take up to 3 business days. We ask that you follow-up with your pharmacy.

## 2022-10-06 MED ORDER — FREESTYLE LIBRE 2 SENSOR MISC: 5 refills | Status: DC

## 2022-10-06 NOTE — Telephone Encounter (Signed)
Requested Prescriptions  Pending Prescriptions Disp Refills   Continuous Blood Gluc Sensor (FREESTYLE LIBRE 2 SENSOR) MISC 2 each 5    Sig: Utilize as directed to check blood glucose     Endocrinology: Diabetes - Testing Supplies Passed - 10/05/2022  4:15 PM      Passed - Valid encounter within last 12 months    Recent Outpatient Visits           1 month ago Hypertension, essential   Primary Care at [facility], MD   2 months ago Hypertension, essential   [facility] at [place], MD   3 months ago Type 2 diabetes mellitus without complication, with long-term current use of insulin [facility])   Primary Care at [facility], MD   4 months ago Hospital discharge follow-up   Primary Care at [facility], Amy J, NP   8 months ago Type 2 diabetes mellitus without complication, with long-term current use of insulin [facility])   Primary Care at [facility], MD       Future Appointments             In 6 days Dorna Mai, MD Primary Care at [facility]   In 1 week Ward Givens, [facility]

## 2022-10-07 ENCOUNTER — Ambulatory Visit: Payer: Managed Care, Other (non HMO)

## 2022-10-07 ENCOUNTER — Ambulatory Visit
Admission: RE | Admit: 2022-10-07 | Discharge: 2022-10-07 | Disposition: A | Discharge status: Home / Self Care | Payer: Managed Care, Other (non HMO) | Source: Ambulatory Visit | Attending: Family Medicine | Admitting: Family Medicine

## 2022-10-07 ENCOUNTER — Ambulatory Visit
Admission: RE | Admit: 2022-10-07 | Discharge: 2022-10-07 | Disposition: A | Discharge status: Home / Self Care | Payer: Commercial Managed Care - HMO | Source: Ambulatory Visit | Attending: Family Medicine | Admitting: Family Medicine

## 2022-10-07 DIAGNOSIS — N644 Mastodynia: Secondary | ICD-10-CM

## 2022-10-12 ENCOUNTER — Ambulatory Visit: Payer: Managed Care, Other (non HMO) | Admitting: Family Medicine

## 2022-10-14 ENCOUNTER — Encounter: Payer: Self-pay | Admitting: *Deleted

## 2022-10-15 ENCOUNTER — Telehealth: Payer: Managed Care, Other (non HMO) | Admitting: Adult Health

## 2022-10-19 ENCOUNTER — Other Ambulatory Visit: Payer: Self-pay | Admitting: *Deleted

## 2022-10-21 ENCOUNTER — Other Ambulatory Visit: Payer: Self-pay | Admitting: *Deleted

## 2022-10-21 DIAGNOSIS — I10 Essential (primary) hypertension: Secondary | ICD-10-CM

## 2022-10-21 MED ORDER — LOSARTAN POTASSIUM 100 MG PO TABS: 100.0000 mg | ORAL_TABLET | Freq: Every day | ORAL | 1 refills | Status: DC

## 2022-10-21 MED ORDER — AMLODIPINE BESYLATE 10 MG PO TABS: 10.0000 mg | ORAL_TABLET | Freq: Every day | ORAL | 1 refills | Status: DC

## 2022-12-04 ENCOUNTER — Other Ambulatory Visit: Payer: Self-pay | Admitting: Family Medicine

## 2022-12-04 DIAGNOSIS — E119 Type 2 diabetes mellitus without complications: Secondary | ICD-10-CM

## 2022-12-04 NOTE — Telephone Encounter (Signed)
Requested Prescriptions  Pending Prescriptions Disp Refills   BD PEN NEEDLE NANO 2ND GEN 32G X 4 MM MISC [Pharmacy Med Name: BD NANO 2 GEN PEN NDL 32G 4MM] 100 each 2    Sig: 1 APPLICATION BY DOES NOT APPLY ROUTE DAILY.     Endocrinology: Diabetes - Testing Supplies Passed - 12/04/2022  8:40 AM      Passed - Valid encounter within last 12 months    Recent Outpatient Visits           3 months ago Hypertension, essential   Primary Care at [facility], MD   4 months ago Hypertension, essential   Primary Care at [facility], Clyde Canterbury, MD   5 months ago Type 2 diabetes mellitus without complication, with long-term current use of insulin [facility])   Primary Care at [facility], MD   5 months ago Hospital discharge follow-up   Primary Care at [facility], Amy J, NP   10 months ago Type 2 diabetes mellitus without complication, with long-term current use of insulin [facility])   Primary Care at [facility], MD

## 2022-12-17 ENCOUNTER — Other Ambulatory Visit: Payer: Self-pay | Admitting: Family Medicine

## 2022-12-18 NOTE — Telephone Encounter (Signed)
Alternative requested: non formulary  Requested Prescriptions  Pending Prescriptions Disp Refills   insulin aspart protamine - aspart (NOVOLOG MIX 70/30 FLEXPEN) (70-30) 100 UNIT/ML FlexPen [Pharmacy Med Name: NOVOLOG MIX 70-30 [place]  0     Endocrinology:  Diabetes - Insulins Failed - 12/17/2022  7:32 PM      Failed - HBA1C is between 0 and 7.9 and within 180 days    HbA1c, POC (controlled diabetic range)  Date Value Ref Range Status  02/11/2021 9.4 (A) 0.0 - 7.0 % Final   Hgb A1c MFr Bld  Date Value Ref Range Status  05/28/2022 11.9 (H) 4.8 - 5.6 % Final    Comment:    (NOTE) Pre diabetes:          5.7%-6.4%  Diabetes:              >6.4%  Glycemic control for   <7.0% adults with diabetes          Passed - Valid encounter within last 6 months    Recent Outpatient Visits           4 months ago Hypertension, essential   Primary Care at [facility], MD   5 months ago Hypertension, essential   Primary Care at [facility], Clyde Canterbury, MD   6 months ago Type 2 diabetes mellitus without complication, with long-term current use of insulin [facility])   Primary Care at [facility], MD   6 months ago Hospital discharge follow-up   Primary Care at [facility], Amy J, NP   11 months ago Type 2 diabetes mellitus without complication, with long-term current use of insulin [facility])   Primary Care at [facility], MD

## 2023-01-03 ENCOUNTER — Other Ambulatory Visit: Payer: Self-pay | Admitting: Family Medicine

## 2023-01-10 ENCOUNTER — Other Ambulatory Visit: Payer: Self-pay | Admitting: Family Medicine

## 2023-01-13 ENCOUNTER — Ambulatory Visit: Payer: Self-pay | Admitting: Adult Health

## 2023-01-20 ENCOUNTER — Other Ambulatory Visit: Payer: Self-pay | Admitting: Family Medicine

## 2023-01-20 MED ORDER — INSULIN LISPRO PROT & LISPRO (75-25 MIX) 100 UNIT/ML KWIKPEN: 20.0000 [IU] | PEN_INJECTOR | Freq: Two times a day (BID) | SUBCUTANEOUS | 11 refills | Status: DC

## 2023-01-23 ENCOUNTER — Other Ambulatory Visit: Payer: Self-pay | Admitting: Family Medicine

## 2023-02-25 ENCOUNTER — Other Ambulatory Visit: Payer: Self-pay | Admitting: Family Medicine

## 2023-03-17 ENCOUNTER — Other Ambulatory Visit: Payer: Self-pay | Admitting: Family Medicine

## 2023-03-17 DIAGNOSIS — I10 Essential (primary) hypertension: Secondary | ICD-10-CM

## 2023-03-20 ENCOUNTER — Ambulatory Visit
Admission: EM | Admit: 2023-03-20 | Discharge: 2023-03-20 | Disposition: A | Discharge status: Home / Self Care | Payer: Medicaid Other | Attending: Internal Medicine | Admitting: Internal Medicine

## 2023-03-20 ENCOUNTER — Ambulatory Visit (INDEPENDENT_AMBULATORY_CARE_PROVIDER_SITE_OTHER): Payer: Medicaid Other

## 2023-03-20 DIAGNOSIS — R053 Chronic cough: Secondary | ICD-10-CM | POA: Diagnosis not present

## 2023-03-20 MED ORDER — BENZONATATE 100 MG PO CAPS: 100.0000 mg | ORAL_CAPSULE | Freq: Three times a day (TID) | ORAL | 0 refills | Status: DC | PRN

## 2023-03-20 NOTE — Discharge Instructions (Signed)
X-ray was normal.  I have prescribed you cough medication.  And continue albuterol inhaler as needed.  Follow-up if any symptoms persist or worsen.

## 2023-03-20 NOTE — ED Provider Notes (Signed)
[name]    CSN: [id number] Arrival date & time: 03/20/23  1236      History   Chief Complaint Chief Complaint  Patient presents with   Cough    HPI Biddie Sebek is a 57 y.o. female.   Patient presents with cough that started about 8 days ago.  Reports cough is mainly dry but she does have productive cough at times.  She reports shortness of breath with harsh coughing fits.  She does have a history of asthma.  She uses albuterol inhaler as needed and used it last night with some improvement in symptoms.  Reports she has had some minimal nasal congestion that is improved since symptoms started.  Denies fever or known sick contacts. Reports history of pneumonia.  Patient has a pulse oximeter at home and reports that her oxygen was ranging from 88 to 92% last night prior to her albuterol administration.   Cough   Past Medical History:  Diagnosis Date   Allergy    Asthma    no current problems, does not use inhaler   Diabetes mellitus without complication    type 2   Essential hypertension    Hyperlipidemia     Patient Active Problem List   Diagnosis Date Noted   Acute ischemic stroke 05/27/2022   Type 2 diabetes mellitus with hyperglycemia 10/07/2021   SVT (supraventricular tachycardia) 10/07/2021   Family history of thyroid disorder 07/15/2020   Mixed hyperlipidemia 04/23/2020   Family history of breast cancer 03/11/2020   Type 2 diabetes mellitus 03/11/2020   Hypertension, essential 07/31/2016   Family history of Alzheimer's disease 08/04/2009    Past Surgical History:  Procedure Laterality Date   APPENDECTOMY  1985   exploratory laparoscopy     WISDOM TOOTH EXTRACTION      OB History   No obstetric history on file.      Home Medications    Prior to Admission medications   Medication Sig Start Date End Date Taking? Authorizing Provider  benzonatate (TESSALON) 100 MG capsule Take 1 capsule (100 mg total) by mouth every 8 (eight) hours as  needed for cough. 03/20/23  Yes Bernette Seeman, Rolly Salter E, FNP  amLODipine (NORVASC) 10 MG tablet TAKE 1 TABLET BY MOUTH EVERY DAY 03/17/23   Georganna Skeans, MD  aspirin EC 81 MG tablet Take 1 tablet (81 mg total) by mouth daily. Swallow whole. 05/30/22   Elmer Picker, NP  buPROPion (WELLBUTRIN XL) 150 MG 24 hr tablet TAKE 1 TABLET BY MOUTH EVERY DAY 01/04/23   Georganna Skeans, MD  Continuous Blood Gluc Sensor (FREESTYLE LIBRE 2 SENSOR) MISC Utilize as directed to check blood glucose 10/06/22   Georganna Skeans, MD  gabapentin (NEURONTIN) 300 MG capsule TAKE 1 CAPSULE BY MOUTH THREE TIMES A DAY 08/14/22   Georganna Skeans, MD  Insulin Lispro Prot & Lispro (HUMALOG MIX 75/25 KWIKPEN) (75-25) 100 UNIT/ML Kwikpen Inject 20 Units into the skin 2 (two) times daily with a meal. 01/20/23   Georganna Skeans, MD  Insulin Pen Needle (BD PEN NEEDLE NANO 2ND GEN) 32G X 4 MM MISC 1 APPLICATION BY DOES NOT APPLY ROUTE DAILY. 12/04/22   Georganna Skeans, MD  Insulin Pen Needle 32G X 4 MM MISC Use as directed with insulin 05/29/22   Marvel Plan, MD  Insulin Syringe-Needle U-100 (BD SAFETYGLIDE INSULIN SYRINGE) 31G X 15/64" 0.5 ML MISC Use to inject insulin twice daily. 06/03/22   Hoy Register, MD  losartan (COZAAR) 100 MG tablet Take 1 tablet (  100 mg total) by mouth daily. 10/21/22   Georganna Skeans, MD  omeprazole (PRILOSEC) 20 MG capsule Take 1 capsule (20 mg total) by mouth daily. 08/13/22   Georganna Skeans, MD  rosuvastatin (CRESTOR) 40 MG tablet Take 1 tablet (40 mg total) by mouth daily. 05/30/22   Elmer Picker, NP  sertraline (ZOLOFT) 100 MG tablet Take 1 tablet (100 mg total) by mouth daily. 08/13/22   Georganna Skeans, MD    Family History Family History  Problem Relation Age of Onset   Memory loss Mother    Breast cancer Mother 2   Stroke Father    Breast cancer Maternal Aunt 74   Colon cancer Neg Hx    Rectal cancer Neg Hx    Stomach cancer Neg Hx    Esophageal cancer Neg Hx     Social History Social History   Tobacco Use    Smoking status: Never    Passive exposure: Never   Smokeless tobacco: Never  Vaping Use   Vaping Use: Never used  Substance Use Topics   Alcohol use: Yes    Alcohol/week: 0.0 standard drinks of alcohol    Comment: occasional wine   Drug use: No     Allergies   Valsartan, Atorvastatin, Codeine, and Metformin and related   Review of Systems Review of Systems Per HPI  Physical Exam Triage Vital Signs ED Triage Vitals [03/20/23 1253]  Enc Vitals Group     BP (!) 149/96     Pulse Rate 93     Resp 18     Temp 98.7 F (37.1 C)     Temp Source Oral     SpO2 95 %     Weight      Height      Head Circumference      Peak Flow      Pain Score 0     Pain Loc      Pain Edu?      Excl. in GC?    No data found.  Updated Vital Signs BP (!) 149/96 (BP Location: Right Arm)   Pulse 93   Temp 98.7 F (37.1 C) (Oral)   Resp 18   LMP 12/10/2015 (LMP Unknown)   SpO2 95%   Visual Acuity Right Eye Distance:   Left Eye Distance:   Bilateral Distance:    Right Eye Near:   Left Eye Near:    Bilateral Near:     Physical Exam Constitutional:      General: She is not in acute distress.    Appearance: Normal appearance. She is not toxic-appearing or diaphoretic.  HENT:     Head: Normocephalic and atraumatic.     Right Ear: Tympanic membrane and ear canal normal.     Left Ear: Tympanic membrane and ear canal normal.     Nose: Congestion present.     Mouth/Throat:     Mouth: Mucous membranes are moist.     Pharynx: No posterior oropharyngeal erythema.  Eyes:     Extraocular Movements: Extraocular movements intact.     Conjunctiva/sclera: Conjunctivae normal.     Pupils: Pupils are equal, round, and reactive to light.  Cardiovascular:     Rate and Rhythm: Normal rate and regular rhythm.     Pulses: Normal pulses.     Heart sounds: Normal heart sounds.  Pulmonary:     Effort: Pulmonary effort is normal. No respiratory distress.     Breath sounds: Normal breath sounds. No  stridor. No  wheezing, rhonchi or rales.  Abdominal:     General: Abdomen is flat. Bowel sounds are normal.     Palpations: Abdomen is soft.  Musculoskeletal:        General: Normal range of motion.     Cervical back: Normal range of motion.  Skin:    General: Skin is warm and dry.  Neurological:     General: No focal deficit present.     Mental Status: She is alert and oriented to person, place, and time. Mental status is at baseline.  Psychiatric:        Mood and Affect: Mood normal.        Behavior: Behavior normal.      UC Treatments / Results  Labs (all labs ordered are listed, but only abnormal results are displayed) Labs Reviewed - No data to display  EKG   Radiology DG Chest 2 View  Result Date: 03/20/2023 CLINICAL DATA:  Cough for 8 days.  Hypoxia. EXAM: CHEST - 2 VIEW COMPARISON:  10/06/2021 FINDINGS: The heart size and mediastinal contours are within normal limits. Both lungs are clear. The visualized skeletal structures are unremarkable. IMPRESSION: No active cardiopulmonary disease. Electronically Signed   By: Danae Orleans M.D.   On: 03/20/2023 13:32    Procedures Procedures (including critical care time)  Medications Ordered in UC Medications - No data to display  Initial Impression / Assessment and Plan / UC Course  I have reviewed the triage vital signs and the nursing notes.  Pertinent labs & imaging results that were available during my care of the patient were reviewed by me and considered in my medical decision making (see chart for details).     Chest x-ray was completed that was negative for any acute cardiopulmonary process.  Suspect possible viral bronchitis.  Nasal congestion is very minimal and is improving so no concern for sinus infection or secondary bacterial infection related to that.  Will prescribe benzonatate to take as needed for cough.  Albuterol inhaler encouraged consistently as prescribed over the next 24 to 48 hours.  There are no  adventitious lung sounds on exam and oxygen is normal here in urgent care so do not think that emergent evaluation is necessary.  Will avoid prednisone given patient's last A1c was elevated.  Advised her to follow-up if symptoms persist or worsen.  Advised monitoring pulse oximeter at home as well.  Patient verbalized understanding and was agreeable with plan. Final Clinical Impressions(s) / UC Diagnoses   Final diagnoses:  Persistent cough     Discharge Instructions      X-ray was normal.  I have prescribed you cough medication.  And continue albuterol inhaler as needed.  Follow-up if any symptoms persist or worsen.     ED Prescriptions     Medication Sig Dispense Auth. Provider   benzonatate (TESSALON) 100 MG capsule Take 1 capsule (100 mg total) by mouth every 8 (eight) hours as needed for cough. 21 capsule [place], Acie Fredrickson, [place]      PDMP not reviewed this encounter.   Gustavus Bryant, [place] 03/20/23 1416

## 2023-03-20 NOTE — ED Triage Notes (Signed)
Pt c/o cough, states home pulse ox reads 88-92%.   Onset ~ 8 days ago

## 2023-03-21 ENCOUNTER — Emergency Department (HOSPITAL_COMMUNITY): Payer: Medicaid Other

## 2023-03-21 ENCOUNTER — Other Ambulatory Visit: Payer: Self-pay

## 2023-03-21 ENCOUNTER — Emergency Department (HOSPITAL_COMMUNITY)
Admission: EM | Admit: 2023-03-21 | Discharge: 2023-03-21 | Disposition: A | Discharge status: Home / Self Care | Payer: Medicaid Other | Attending: Emergency Medicine | Admitting: Emergency Medicine

## 2023-03-21 ENCOUNTER — Encounter (HOSPITAL_COMMUNITY): Payer: Self-pay | Admitting: Emergency Medicine

## 2023-03-21 DIAGNOSIS — I10 Essential (primary) hypertension: Secondary | ICD-10-CM | POA: Insufficient documentation

## 2023-03-21 DIAGNOSIS — G43109 Migraine with aura, not intractable, without status migrainosus: Secondary | ICD-10-CM

## 2023-03-21 DIAGNOSIS — Z7982 Long term (current) use of aspirin: Secondary | ICD-10-CM | POA: Insufficient documentation

## 2023-03-21 DIAGNOSIS — Z794 Long term (current) use of insulin: Secondary | ICD-10-CM | POA: Insufficient documentation

## 2023-03-21 DIAGNOSIS — D72829 Elevated white blood cell count, unspecified: Secondary | ICD-10-CM | POA: Diagnosis not present

## 2023-03-21 DIAGNOSIS — J45909 Unspecified asthma, uncomplicated: Secondary | ICD-10-CM | POA: Insufficient documentation

## 2023-03-21 DIAGNOSIS — Z79899 Other long term (current) drug therapy: Secondary | ICD-10-CM | POA: Insufficient documentation

## 2023-03-21 DIAGNOSIS — Y900 Blood alcohol level of less than 20 mg/100 ml: Secondary | ICD-10-CM | POA: Insufficient documentation

## 2023-03-21 DIAGNOSIS — G43809 Other migraine, not intractable, without status migrainosus: Secondary | ICD-10-CM

## 2023-03-21 DIAGNOSIS — E119 Type 2 diabetes mellitus without complications: Secondary | ICD-10-CM | POA: Insufficient documentation

## 2023-03-21 DIAGNOSIS — R2990 Unspecified symptoms and signs involving the nervous system: Secondary | ICD-10-CM

## 2023-03-21 DIAGNOSIS — R200 Anesthesia of skin: Secondary | ICD-10-CM | POA: Diagnosis present

## 2023-03-21 LAB — DIFFERENTIAL
Abs Immature Granulocytes: 0.05 10*3/uL (ref 0.00–0.07)
Basophils Absolute: 0.1 10*3/uL (ref 0.0–0.1)
Basophils Relative: 1 %
Eosinophils Absolute: 0.5 10*3/uL (ref 0.0–0.5)
Eosinophils Relative: 5 %
Immature Granulocytes: 1 %
Lymphocytes Relative: 35 %
Lymphs Abs: 3.8 10*3/uL (ref 0.7–4.0)
Monocytes Absolute: 0.4 10*3/uL (ref 0.1–1.0)
Monocytes Relative: 4 %
Neutro Abs: 6.1 10*3/uL (ref 1.7–7.7)
Neutrophils Relative %: 54 %

## 2023-03-21 LAB — COMPREHENSIVE METABOLIC PANEL
ALT: 20 U/L (ref 0–44)
AST: 20 U/L (ref 15–41)
Albumin: 3.8 g/dL (ref 3.5–5.0)
Alkaline Phosphatase: 82 U/L (ref 38–126)
Anion gap: 13 (ref 5–15)
BUN: 8 mg/dL (ref 6–20)
CO2: 23 mmol/L (ref 22–32)
Calcium: 9.6 mg/dL (ref 8.9–10.3)
Chloride: 104 mmol/L (ref 98–111)
Creatinine, Ser: 0.78 mg/dL (ref 0.44–1.00)
GFR, Estimated: >60 mL/min (ref >60)
Glucose, Bld: 200 mg/dL — ABNORMAL HIGH (ref 70–99)
Potassium: 4 mmol/L (ref 3.5–5.1)
Sodium: 140 mmol/L (ref 135–145)
Total Bilirubin: 0.6 mg/dL (ref 0.3–1.2)
Total Protein: 6.9 g/dL (ref 6.5–8.1)

## 2023-03-21 LAB — CBC
HCT: 41 % (ref 36.0–46.0)
Hemoglobin: 13.8 g/dL (ref 12.0–15.0)
MCH: 29 pg (ref 26.0–34.0)
MCHC: 33.7 g/dL (ref 30.0–36.0)
MCV: 86.1 fL (ref 80.0–100.0)
Platelets: 311 10*3/uL (ref 150–400)
RBC: 4.76 MIL/uL (ref 3.87–5.11)
RDW: 13.2 % (ref 11.5–15.5)
WBC: 10.9 10*3/uL — ABNORMAL HIGH (ref 4.0–10.5)
nRBC: 0 % (ref 0.0–0.2)

## 2023-03-21 LAB — I-STAT CHEM 8, ED
BUN: 10 mg/dL (ref 6–20)
Calcium, Ion: 1.2 mmol/L (ref 1.15–1.40)
Chloride: 105 mmol/L (ref 98–111)
Creatinine, Ser: 0.6 mg/dL (ref 0.44–1.00)
Glucose, Bld: 203 mg/dL — ABNORMAL HIGH (ref 70–99)
HCT: 40 % (ref 36.0–46.0)
Hemoglobin: 13.6 g/dL (ref 12.0–15.0)
Potassium: 3.8 mmol/L (ref 3.5–5.1)
Sodium: 139 mmol/L (ref 135–145)
TCO2: 26 mmol/L (ref 22–32)

## 2023-03-21 LAB — PROTIME-INR
INR: 1 (ref 0.8–1.2)
Prothrombin Time: 12.6 seconds (ref 11.4–15.2)

## 2023-03-21 LAB — CBG MONITORING, ED: Glucose-Capillary: 214 mg/dL — ABNORMAL HIGH (ref 70–99)

## 2023-03-21 LAB — ETHANOL: Alcohol, Ethyl (B): <10 mg/dL (ref <10)

## 2023-03-21 LAB — APTT: aPTT: 26 seconds (ref 24–36)

## 2023-03-21 MED ORDER — SODIUM CHLORIDE 0.9% FLUSH
3.0000 mL | Freq: Once | INTRAVENOUS | Status: AC
  Administered 2023-03-21: 3 mL via INTRAVENOUS

## 2023-03-21 MED ORDER — PROCHLORPERAZINE EDISYLATE 10 MG/2ML IJ SOLN
10.0000 mg | Freq: Once | INTRAMUSCULAR | Status: AC
  Administered 2023-03-21: 10 mg via INTRAVENOUS

## 2023-03-21 NOTE — Consult Note (Signed)
NEURO HOSPITALIST CONSULT NOTE   Requestig physician: Dr. Jacqulyn Bath  Reason for Consult: Acute onset of left sided numbness with Jacksonian march pattern  History obtained from:   Patient and Chart     HPI:                                                                                                                                          Jessica Ponce is a 57 y.o. female with a PMHx of stroke last June with left sided weakness now resolved, asthma, rare migraine headaches (one prior episode many years ago with neurological symptoms but she cannot recall the deficits), DM2, HTN and HLD who presents from home after acute onset of left sided numbness and weakness. LKN was 1:40 PM when she suddenly noticed left perioral numbness and tingling that migrated slightly across midline to the right, then involved most of the left side of her face. She then felt numbness of all 5 fingers of her left hand that then spread up her arm to just below her shoulder. This was followed by left foot numbness that migrated up her leg to approximately knee level. In conjunction with the numbness she also experienced mild left sided weakness. Total time for spread of the above symptoms was approximately 5-10 minutes. Shortly after the face numbness began, she started to experience a mild left sided headache centered primarily in the left temporal region. Denies current photophobia, sonophobia or osmophobia. Headache continues and is rated as 2/10, nonthrobbing. No neck pain or meningismus. No visual symptoms.   Home medications include ASA and rosuvastatin.   Past Medical History:  Diagnosis Date   Allergy    Asthma    no current problems, does not use inhaler   Diabetes mellitus without complication    type 2   Essential hypertension    Hyperlipidemia     Past Surgical History:  Procedure Laterality Date   APPENDECTOMY  1985   exploratory laparoscopy     WISDOM TOOTH EXTRACTION      Family  History  Problem Relation Age of Onset   Memory loss Mother    Breast cancer Mother 2   Stroke Father    Breast cancer Maternal Aunt 71   Colon cancer Neg Hx    Rectal cancer Neg Hx    Stomach cancer Neg Hx    Esophageal cancer Neg Hx              Social History:  reports that she has never smoked. She has never been exposed to tobacco smoke. She has never used smokeless tobacco. She reports current alcohol use. She reports that she does not use drugs.  Allergies  Allergen Reactions   Valsartan Diarrhea   Atorvastatin Other (See Comments)  Elevated sugars   Codeine Nausea And Vomiting   Metformin And Related Diarrhea    HOME MEDICATIONS:                                                                                                                      No current facility-administered medications on file prior to encounter.   Current Outpatient Medications on File Prior to Encounter  Medication Sig Dispense Refill   amLODipine (NORVASC) 10 MG tablet TAKE 1 TABLET BY MOUTH EVERY DAY 90 tablet 1   aspirin EC 81 MG tablet Take 1 tablet (81 mg total) by mouth daily. Swallow whole. 30 tablet 12   benzonatate (TESSALON) 100 MG capsule Take 1 capsule (100 mg total) by mouth every 8 (eight) hours as needed for cough. 21 capsule 0   buPROPion (WELLBUTRIN XL) 150 MG 24 hr tablet TAKE 1 TABLET BY MOUTH EVERY DAY 90 tablet 0   Continuous Blood Gluc Sensor (FREESTYLE LIBRE 2 SENSOR) MISC Utilize as directed to check blood glucose 2 each 5   gabapentin (NEURONTIN) 300 MG capsule TAKE 1 CAPSULE BY MOUTH THREE TIMES A DAY 84 capsule 1   Insulin Lispro Prot & Lispro (HUMALOG MIX 75/25 KWIKPEN) (75-25) 100 UNIT/ML Kwikpen Inject 20 Units into the skin 2 (two) times daily with a meal. 15 mL 11   Insulin Pen Needle (BD PEN NEEDLE NANO 2ND GEN) 32G X 4 MM MISC 1 APPLICATION BY DOES NOT APPLY ROUTE DAILY. 100 each 2   Insulin Pen Needle 32G X 4 MM MISC Use as directed with insulin 100 each 0    Insulin Syringe-Needle U-100 (BD SAFETYGLIDE INSULIN SYRINGE) 31G X 15/64" 0.5 ML MISC Use to inject insulin twice daily. 100 each 0   losartan (COZAAR) 100 MG tablet Take 1 tablet (100 mg total) by mouth daily. 90 tablet 1   omeprazole (PRILOSEC) 20 MG capsule Take 1 capsule (20 mg total) by mouth daily. 90 capsule 1   rosuvastatin (CRESTOR) 40 MG tablet Take 1 tablet (40 mg total) by mouth daily. 30 tablet 1   sertraline (ZOLOFT) 100 MG tablet Take 1 tablet (100 mg total) by mouth daily. 90 tablet 1     ROS:  No fever, chills, N/V, vertigo, CP or abdominal symptoms. Other ROS as per HPI.    Blood pressure (!) 130/99, pulse 77, temperature 97.8 F (36.6 C), temperature source Oral, resp. rate 18, height  (1.651 m), weight 93.1 kg, last menstrual period 12/10/2015, SpO2 96 %.   General Examination:                                                                                                       Physical Exam  HEENT-  [place]/AT. Massage of the temporalis muscles does not improve her headache pain.  Lungs- Respirations unlabored Extremities- No edema.    Neurological Examination Mental Status: Awake and alert. Oriented x 5. Speech fluent with intact naming and comprehension. Anxious affect.  Cranial Nerves: II: Visual fields intact bilaterally. No extinction to DSS.   III,IV, VI: No ptosis. EOMI. No nystagmus.  V: Temp sensation subjectively decreased on the left VII: Smile symmetric VIII: Hearing intact to conversation IX,X: No hoarseness or hypophonia XI: Symmetric XII: Midline tongue extension Motor: RUE and RLE 5/5 proximally and distally LUE 4+/5 maximum strength elicited in the context of intermittent giveway LLE  4+/5 maximum strength elicited in the context of intermittent giveway Bobbing drift by approximately 1 inch to LUE and LLE  when limbs are held elevated against gravity for several seconds.  Sensory: Subjectively decreased temp and FT sensation to LUE and LLE. No extinction to DSS. Sensation normal on the right Deep Tendon Reflexes: 2+ and symmetric bilateral biceps, triceps, brachioradialis and patellae. Toes downgoing bilaterally  Cerebellar: No ataxia with FNF or H-S bilaterally Gait: Deferred  NIHSS: 3   Lab Results: Basic Metabolic Panel: No results for input(s): "NA", "K", "CL", "CO2", "GLUCOSE", "BUN", "CREATININE", "CALCIUM", "MG", "PHOS" in the last 168 hours.  CBC: No results for input(s): "WBC", "NEUTROABS", "HGB", "HCT", "MCV", "PLT" in the last 168 hours.  Cardiac Enzymes: No results for input(s): "CKTOTAL", "CKMB", "CKMBINDEX", "TROPONINI" in the last 168 hours.  Lipid Panel: No results for input(s): "CHOL", "TRIG", "HDL", "CHOLHDL", "VLDL", "LDLCALC" in the last 168 hours.  Imaging: DG Chest 2 View  Result Date: 03/20/2023 CLINICAL DATA:  Cough for 8 days.  Hypoxia. EXAM: CHEST - 2 VIEW COMPARISON:  10/06/2021 FINDINGS: The heart size and mediastinal contours are within normal limits. Both lungs are clear. The visualized skeletal structures are unremarkable. IMPRESSION: No active cardiopulmonary disease. Electronically Signed   By: Danae Orleans M.D.   On: 03/20/2023 13:32    Assessment: 57 y.o. female with a PMHx of stroke last June with left sided weakness now resolved, asthma, rare migraine headaches (one prior episode many years ago with neurological symptoms but she cannot recall the deficits), DM2, HTN and HLD who presents from home after acute onset of left sided numbness and weakness. LKN was 1:40 PM when she suddenly noticed left perioral numbness and tingling that migrated slightly across midline to the right, then involved most of the left side of her face. She then felt numbness of all 5 fingers of her left hand that then spread up her  arm to just below her shoulder. This was followed  by left foot numbness that migrated up her leg to approximately knee level. In conjunction with the numbness she also experienced mild left sided weakness. Total time for spread of the above symptoms was approximately 5-10 minutes. Shortly after the face numbness began, she started to experience a mild left sided headache centered primarily in the left temporal region. Denies current photophobia, sonophobia or osmophobia. Headache continues and is rated as 2/10, nonthrobbing. No neck pain or meningismus. No visual symptoms. Home medications include ASA and rosuvastatin. - Exam reveals mild left sided drift and giveway weakness as well as sensory disturbance to left face, arm and leg. No visual field cut, facial droop, dysphasia or dysarthria noted. NIHSS: 3 - CT head:  Negative CT of the head. Aspects is 10/10. - Labs: Glucose 214, Na and K normal, ionized Ca normal, BUN and Cr normal.  - EKG: Normal sinus rhythm; Normal ECG - Overall presentation is suggestive of complicated migraine with atypical feature of mild rather than severe headache as well as no preceding aura. A small stroke is also possible.  - Not a TNK candidate as symptoms are too mild to treat. Risks of TNK significantly outweigh potential benefits.   Recommendations: - STAT MRI brain with MRA of head and neck - Compazine 10 mg IV x 1 - Further recommendations following MRI scans.  Addendum: - MRI brain, MRA head and MRA neck are all normal.  - Safe to discharge home with close outpatient neurology follow up   Electronically signed: Dr. Caryl Pina 03/21/2023, 4:13 PM

## 2023-03-21 NOTE — ED Triage Notes (Signed)
Pt BIB EMS from home at 1:40pm pt was sitting at rest and pt felt L facial numbness. Waited 1 hour and started feeling L leg numbness and weakness, ambulatory on scene. Has had cold for 9 days seen at urgent care yesterday for cold symptoms. Reports increased stress at home.  EMS VS: 150/96 HR 74 97% RA CBG 186

## 2023-03-21 NOTE — Discharge Instructions (Signed)
You were seen in the emergency room today with strokelike symptoms.  I suspect these are due to migraine headache rather than stroke.  Your MRI and other imaging were reassuring.  The neurologist feels you are safe for discharge.  I have placed a referral in our system to see the outpatient neurologist for follow-up.  They should call you for an appointment but I would encourage you to call their office tomorrow morning as well to schedule.

## 2023-03-21 NOTE — ED Provider Notes (Signed)
Emergency Department Provider Note   I have reviewed the triage vital signs and the nursing notes.   HISTORY  Chief Complaint Weakness   HPI Jessica Ponce is a 57 y.o. female past history of hypertension, hyperlipidemia, diabetes presents to the emergency department by EMS.  She apparently had acute onset left face numbness starting at 1:40 PM.  She has had similar symptoms in the past with stroke symptoms last year leading to TNK administration.  She states that she knew to look at the clock when symptoms began which is how she knows the time.  Symptoms began in the face and then drifted to the left arm.  After 1 hour she began having numbness and weakness in the left leg and so called 911 at that time.  Code stroke was not activated from the field.  Denies any vision or speech changes. No CP or SOB. Does note significant stressors at home with family and recent UC visit for URI.    Past Medical History:  Diagnosis Date   Allergy    Asthma    no current problems, does not use inhaler   Diabetes mellitus without complication    type 2   Essential hypertension    Hyperlipidemia     Review of Systems  Constitutional: No fever/chills Cardiovascular: Denies chest pain. Respiratory: Denies shortness of breath. Gastrointestinal: No abdominal pain.  No nausea, no vomiting.  Musculoskeletal: Negative for back pain. Skin: Negative for rash. Neurological: Negative for headaches. Left face, arm, and left numbness. Positive left leg weakness.   ____________________________________________   PHYSICAL EXAM:  VITAL SIGNS: ED Triage Vitals  Enc Vitals Group     BP 03/21/23 1557 (!) 130/99     Pulse Rate 03/21/23 1557 77     Resp 03/21/23 1557 18     Temp 03/21/23 1557 97.8 F (36.6 C)     Temp Source 03/21/23 1557 Oral     SpO2 03/21/23 1557 96 %     Weight 03/21/23 1600 205 lb 4 oz (93.1 kg)     Height 03/21/23 1600  (1.651 m)   Constitutional: Alert and oriented. Well  appearing and in no acute distress. Eyes: Conjunctivae are normal.  Head: Atraumatic. Nose: No congestion/rhinnorhea. Mouth/Throat: Mucous membranes are moist.  Neck: No stridor.  Cardiovascular: Normal rate, regular rhythm. Good peripheral circulation. Grossly normal heart sounds.   Respiratory: Normal respiratory effort.  No retractions. Lungs CTAB. Gastrointestinal: Soft and nontender. No distention.  Musculoskeletal: No lower extremity tenderness nor edema. No gross deformities of extremities. Neurologic:  Normal speech and language.  Subjective decrease sensation to light touch to the left face, arm, left thigh.  Normal sensation below the knee on the left.  4+/5 strength in the LLE. No drift in the upper extremities. 5/5 grip strength bilaterally.  Skin:  Skin is warm, dry and intact. No rash noted.  ____________________________________________   LABS (all labs ordered are listed, but only abnormal results are displayed)  Labs Reviewed  CBC - Abnormal; Notable for the following components:      Result Value   WBC 10.9 (*)    All other components within normal limits  COMPREHENSIVE METABOLIC PANEL - Abnormal; Notable for the following components:   Glucose, Bld 200 (*)    All other components within normal limits  I-STAT CHEM 8, ED - Abnormal; Notable for the following components:   Glucose, Bld 203 (*)    All other components within normal limits  CBG MONITORING,  ED - Abnormal; Notable for the following components:   Glucose-Capillary 214 (*)    All other components within normal limits  PROTIME-INR  APTT  DIFFERENTIAL  ETHANOL   ____________________________________________  EKG   EKG Interpretation  Date/Time:  Sunday March 21 2023 15:54:08 EDT Ventricular Rate:  79 PR Interval:  158 QRS Duration: 86 QT Interval:  400 QTC Calculation: 458 R Axis:   48 Text Interpretation: Normal sinus rhythm Normal ECG When compared with ECG of 07-Jun-2022 02:00, PREVIOUS ECG  IS PRESENT Confirmed by Alona Bene [phone]) on 03/21/2023 4:12:02 PM        ____________________________________________  RADIOLOGY  MR ANGIO NECK WO CONTRAST  Result Date: 03/21/2023 CLINICAL DATA:  Neuro deficit, acute, stroke suspected. Left face and leg weakness. Left lower extremity numbness. EXAM: MRA NECK WITHOUT CONTRAST TECHNIQUE: Angiographic images of the neck were acquired using MRA technique without intravenous contrast. Carotid stenosis measurements (when applicable) are obtained utilizing NASCET criteria, using the distal internal carotid diameter as the denominator. COMPARISON:  None Available. FINDINGS: Aortic arch: A 3 vessel arch configuration is present. No significant stenosis is present. Right carotid system: The right internal carotid artery scratched at the right common carotid artery is within normal limits. Bifurcation is unremarkable. Mild tortuosity is present in the cervical right ICA. No focal stenosis is present. Left carotid system: The left common carotid artery is within normal limits. Bifurcation is unremarkable. Mild tortuosity is present in the cervical left ICA. No focal stenosis is present. Vertebral arteries: The vertebral arteries are codominant. Flow is antegrade in both vertebral arteries. No focal stenosis is present. Other: None. IMPRESSION: 1. Negative MRA of the neck. No significant proximal stenosis or large vessel occlusion. Electronically Signed   By: Marin Roberts M.D.   On: 03/21/2023 17:26   MR ANGIO HEAD WO CONTRAST  Result Date: 03/21/2023 CLINICAL DATA:  Deficit, acute, stroke suspected. New onset of left facial numbness, left leg numbness and weakness. EXAM: MRA HEAD WITHOUT CONTRAST TECHNIQUE: Angiographic images of the Circle of Willis were acquired using MRA technique without intravenous contrast. COMPARISON:  CTA head and neck 05/27/2022 FINDINGS: Anterior circulation: Is the internal carotid arteries are within normal limits from  the high cervical segments through the ICA termini. The A1 and M1 segments are normal. The anterior communicating artery is patent. MCA bifurcations are within normal limits. The ACA and MCA branch vessels are normal. No aneurysm is present. Posterior circulation: The left vertebral artery is the dominant vessel. PICA origins are visualized and normal. The vertebrobasilar junction basilar artery normal. Left superior cerebellar artery is duplicated. Right superior cerebellar artery is within normal limits. Both posterior cerebral arteries originate from basilar tip. The PCA branch vessels are normal bilaterally. Anatomic variants: None Other: None. IMPRESSION: Normal MRA circle-of-Willis without significant proximal stenosis, aneurysm, or branch vessel occlusion. Electronically Signed   By: Marin Roberts M.D.   On: 03/21/2023 17:23   MR BRAIN WO CONTRAST  Result Date: 03/21/2023 CLINICAL DATA:  Neuro deficit, acute, stroke suspected. Acute onset of left facial numbness followed by left leg numbness and weakness. EXAM: MRI HEAD WITHOUT CONTRAST TECHNIQUE: Multiplanar, multiecho pulse sequences of the brain and surrounding structures were obtained without intravenous contrast. COMPARISON:  CT head without contrast 03/21/2023. MR head without contrast 06/07/2022. FINDINGS: Brain: No acute infarct, hemorrhage, or mass lesion is present. No significant white matter lesions are present. Deep brain nuclei are within normal limits. The ventricles are of normal size. No significant extraaxial fluid  collection is present. The brainstem and cerebellum are within normal limits. The internal auditory canals are within normal limits. Midline structures are within normal limits. Vascular: Flow is present in the major intracranial arteries. Skull and upper cervical spine: Prominent adenoid tissue noted. Question URI. The craniocervical junction is normal. Upper cervical spine is within normal limits. Marrow signal is  unremarkable. Sinuses/Orbits: The paranasal sinuses and mastoid air cells are clear. The globes and orbits are within normal limits. IMPRESSION: 1. Normal MRI appearance of the brain. No acute or focal lesion to explain the patient's symptoms. 2. Prominent adenoid tissue. Question URI. Electronically Signed   By: Marin Roberts M.D.   On: 03/21/2023 17:21   CT HEAD CODE STROKE WO CONTRAST  Result Date: 03/21/2023 CLINICAL DATA:  Code stroke. Acute onset of left facial numbness. Left leg numbness and weakness followed. Respiratory infection over the last 9 days. EXAM: CT HEAD WITHOUT CONTRAST TECHNIQUE: Contiguous axial images were obtained from the base of the skull through the vertex without intravenous contrast. RADIATION DOSE REDUCTION: This exam was performed according to the departmental dose-optimization program which includes automated exposure control, adjustment of the mA and/or kV according to patient size and/or use of iterative reconstruction technique. COMPARISON:  CT head without contrast 06/07/2022 MR head without contrast 06/07/2022 FINDINGS: Brain: No acute infarct, hemorrhage, or mass lesion is present. No significant white matter lesions are present. The ventricles are of normal size. Deep brain nuclei are within normal limits. No significant extraaxial fluid collection is present. The brainstem and cerebellum are within normal limits. Midline structures are within normal limits. Vascular: No hyperdense vessel or unexpected calcification. Skull: Calvarium is intact. No focal lytic or blastic lesions are present. No significant extracranial soft tissue lesion is present. Sinuses/Orbits: The paranasal sinuses and mastoid air cells are clear. The globes and orbits are within normal limits. ASPECTS [facility] Stroke Program Early CT Score) - Ganglionic level infarction (caudate, lentiform nuclei, internal capsule, insula, M1-M3 cortex): 7/7 - Supraganglionic infarction (M4-M6 cortex): 3/3  Total score (0-10 with 10 being normal): 10/10 IMPRESSION: 1. Negative CT of the head. 2. Aspects is 10/10. The above was relayed via text pager to Dr. Caryl Pina on 03/21/2023 at 16:23 . Electronically Signed   By: Marin Roberts M.D.   On: 03/21/2023 16:24    ____________________________________________   PROCEDURES  Procedure(s) performed:   Procedures  CRITICAL CARE Performed by: Maia Plan Total critical care time: 35 minutes Critical care time was exclusive of separately billable procedures and treating other patients. Critical care was necessary to treat or prevent imminent or life-threatening deterioration. Critical care was time spent personally by me on the following activities: development of treatment plan with patient and/or surrogate as well as nursing, discussions with consultants, evaluation of patient's response to treatment, examination of patient, obtaining history from patient or surrogate, ordering and performing treatments and interventions, ordering and review of laboratory studies, ordering and review of radiographic studies, pulse oximetry and re-evaluation of patient's condition.  Alona Bene, MD Emergency Medicine  ____________________________________________   INITIAL IMPRESSION / ASSESSMENT AND PLAN / ED COURSE  Pertinent labs & imaging results that were available during my care of the patient were reviewed by me and considered in my medical decision making (see chart for details).   This patient is Presenting for Evaluation of AMS, which does require a range of treatment options, and is a complaint that involves a high risk of morbidity and mortality.  The Differential Diagnoses includes  but is not exclusive to alcohol, illicit or prescription medications, intracranial pathology such as stroke, intracerebral hemorrhage, fever or infectious causes including sepsis, hypoxemia, uremia, trauma, endocrine related disorders such as diabetes,  hypoglycemia, thyroid-related diseases, etc.   Critical Interventions-    Medications  sodium chloride flush (NS) 0.9 % injection 3 mL (3 mLs Intravenous Given 03/21/23 1639)  prochlorperazine (COMPAZINE) injection 10 mg (10 mg Intravenous Given 03/21/23 1639)    Reassessment after intervention:  Patient not a TNK candidate. MRI pending.    I decided to review pertinent External Data, and in summary patient received TNK last June 2023.  Apparently left the hospital at that time with some residual left side symptoms. Patient tells me that these resolved completely since that time.   Clinical Laboratory Tests Ordered, included CBC with mild leukocytosis. No AKI. Normal electrolytes.   Radiologic Tests Ordered, included CT head, MRI brain. I independently interpreted the images and agree with radiology interpretation.   Cardiac Monitor Tracing which shows NSR.    Social Determinants of Health Risk negative smoking history.   Consult complete with Neurology. Active code stroke after my evaluation of patient.   07:24 PM  Coordinated with Dr. Otelia Limes. Symptoms improved and MRI reassuring. No further inpatient workup required. Stable for d/c. I have placed a referral for outpatient neurology follow up.   Medical Decision Making: Summary:  Patient presents emergency department with left face, arm, leg numbness and mild left leg weakness.  Symptoms are fairly minimal but given the unilateral nature and acute onset I did activate a code stroke.  CTA head and neck along with MRI brain last year were all within normal limits.   Reevaluation with update and discussion with patient. HA resolved. Will coordinate with Neurology regarding recommendations.   Considered admission but workup reassuring. Stable for discharge. Referral placed.   Patient's presentation is most consistent with acute presentation with potential threat to life or bodily function.   Disposition:  discharge  ____________________________________________  FINAL CLINICAL IMPRESSION(S) / ED DIAGNOSES  Final diagnoses:  Stroke-like symptoms  Other migraine without status migrainosus, not intractable    Note:  This document was prepared using Dragon voice recognition software and may include unintentional dictation errors.  Alona Bene, MD, [facility]    Dontrez Pettis, Arlyss Repress, MD 03/21/23 Kristopher Oppenheim

## 2023-03-21 NOTE — Code Documentation (Addendum)
Stroke Response Nurse Documentation Code Documentation  Jessica Ponce is a 57 y.o. female arriving to [facility]  via [facility] on 03/21/23 with past medical hx of DM, HTN, HLD, prior stroke June 2023. On aspirin 81 mg daily.   Patient from home when at 1340 she noticed facial numbing around her mouth. Within the next 10 minutes tingling/numbness in her left hand slowly progressing up arm and then left foot progressing up leg. Patient later called EMS for evaluation, code stroke activated in ED.   Labs drawn and patient cleared for CT by Dr. Jacqulyn Bath. Patient to CT. Stroke team at the bedside on patient activation. NIHSS 3, see documentation for details and code stroke times.    The following imaging was completed:  CT Head and MRI. Patient is not a candidate for IV Thrombolytic due to symptoms mild, possible complicated migraine. Patient is not a candidate for IR due to no LVO.   Care Plan: IV compazine  given for h/a. MRI/MRA, q45m NIH/vitals until out of window at 1810.   1728 per Otelia Limes MD, MRI/MRA neg, now q2h NIH/vitals. Updated Boneta Lucks RN.   Bedside handoff with ED RN Ashley/Jenny.    Scarlette Slice K  Rapid Response RN

## 2023-04-01 ENCOUNTER — Other Ambulatory Visit: Payer: Self-pay | Admitting: Family Medicine

## 2023-04-01 DIAGNOSIS — I10 Essential (primary) hypertension: Secondary | ICD-10-CM

## 2023-04-07 ENCOUNTER — Telehealth: Payer: Self-pay | Admitting: *Deleted

## 2023-04-07 ENCOUNTER — Ambulatory Visit: Payer: Self-pay | Admitting: *Deleted

## 2023-04-07 NOTE — Telephone Encounter (Signed)
  Chief Complaint: Right Bicep pain Symptoms: above Frequency: Pain has been going on for several months but lately getting worse.   Sore and limiting her movement.  Sore to the touch. Pertinent Negatives: Patient denies injuries or accidents Disposition: [] ED /[] Urgent Care (no appt availability in office) / [x] Appointment(In office/virtual)/ []  [facility]/ [] Home Care/ [] Refused Recommended Disposition /[x] [facility]/ []  Follow-up with PCP Additional Notes:  No appts available Primary Care at [facility] until June 2024 so I offered pt the [place] mobile unit which she was agreeable to going.   I gave her the location and information. I also made her an appt for a check up for 05/14/2023 at 8:00 with Dr. Andrey Campanile.

## 2023-04-07 NOTE — Telephone Encounter (Signed)
Reason for Disposition  Arm pain is a chronic symptom (recurrent or ongoing AND present > 4 weeks)  Answer Assessment - Initial Assessment Questions 1. ONSET: "When did the pain start?"     Bicep pain for a few months.   It limits my range of motion.    2. LOCATION: "Where is the pain located?"     Right bicep is very painful.   It's the bicep.    3. PAIN: "How bad is the pain?" (Scale 1-10; or mild, moderate, severe)   - MILD (1-3): Doesn't interfere with normal activities.   - MODERATE (4-7): Interferes with normal activities (e.g., work or school) or awakens from sleep.   - SEVERE (8-10): Excruciating pain, unable to do any normal activities, unable to hold a cup of water.     Moderate  4. WORK OR EXERCISE: "Has there been any recent work or exercise that involved this part of the body?"     No 5. CAUSE: "What do you think is causing the arm pain?"     No injuries or accidents. 6. OTHER SYMPTOMS: "Do you have any other symptoms?" (e.g., neck pain, swelling, rash, fever, numbness, weakness)     Limits her movement 7. PREGNANCY: "Is there any chance you are pregnant?" "When was your last menstrual period?"     Not asked  Protocols used: Arm Pain-A-AH

## 2023-04-07 NOTE — Telephone Encounter (Signed)
I called pt to let her know the [facility] Unit has changed locations.   Instead of being at the [facility] on [address] it is now at the [facility] on [address] In [place].  I left a detailed voice mail letting her know of the location change on her cell at [phone].

## 2023-04-28 ENCOUNTER — Other Ambulatory Visit: Payer: Self-pay | Admitting: Family Medicine

## 2023-04-29 NOTE — Telephone Encounter (Signed)
Requested Prescriptions  Pending Prescriptions Disp Refills   buPROPion (WELLBUTRIN XL) 150 MG 24 hr tablet [Pharmacy Med Name: BUPROPION HCL XL 150 MG TABLET] 90 tablet 0    Sig: TAKE 1 TABLET BY MOUTH EVERY DAY     Psychiatry: Antidepressants - bupropion Failed - 04/28/2023  3:40 PM      Failed - Last BP in normal range    BP Readings from Last 1 Encounters:  03/21/23 (!) 137/93         Failed - Valid encounter within last 6 months    Recent Outpatient Visits           8 months ago Hypertension, essential   [place] [facility], MD   9 months ago Hypertension, essential   [place] [facility], Lauris Poag, MD   10 months ago Type 2 diabetes mellitus without complication, with long-term current use of insulin [facility])   [place] [facility], MD   10 months ago Hospital discharge follow-up   [facility], Amy J, NP   1 year ago Type 2 diabetes mellitus without complication, with long-term current use of insulin [facility])    [facility] Care at [facility], MD       Future Appointments             In 2 weeks Georganna Skeans, MD [facility] at [facility] - Cr in normal range and within 360 days    Creatinine, Ser  Date Value Ref Range Status  03/21/2023 0.60 0.44 - 1.00 mg/dL Final   Creatinine, Urine  Date Value Ref Range Status  09/17/2007 103.7  Final         Passed - AST in normal range and within 360 days    AST  Date Value Ref Range Status  03/21/2023 20 15 - 41 U/L Final         Passed - ALT in normal range and within 360 days    ALT  Date Value Ref Range Status  03/21/2023 20 0 - 44 U/L Final

## 2023-05-14 ENCOUNTER — Ambulatory Visit: Payer: Medicaid Other | Admitting: Family Medicine

## 2023-05-14 VITALS — BP 136/85 | HR 78 | Temp 98.1°F | Resp 16 | Wt 212.2 lb

## 2023-05-14 DIAGNOSIS — Z794 Long term (current) use of insulin: Secondary | ICD-10-CM | POA: Diagnosis not present

## 2023-05-14 DIAGNOSIS — E1165 Type 2 diabetes mellitus with hyperglycemia: Secondary | ICD-10-CM

## 2023-05-14 DIAGNOSIS — M25512 Pain in left shoulder: Secondary | ICD-10-CM

## 2023-05-14 MED ORDER — DEXCOM G6 SENSOR MISC: 2 refills | Status: DC

## 2023-05-14 NOTE — Progress Notes (Unsigned)
Patient is here for their 6 month follow-up Patient has no concerns today Care gaps have been discussed with patient  

## 2023-05-17 ENCOUNTER — Telehealth: Payer: Self-pay | Admitting: Family Medicine

## 2023-05-17 ENCOUNTER — Encounter: Payer: Self-pay | Admitting: Family Medicine

## 2023-05-17 LAB — POCT GLYCOSYLATED HEMOGLOBIN (HGB A1C): Hemoglobin A1C: 10.3 % — AB (ref 4.0–5.6)

## 2023-05-17 NOTE — Progress Notes (Signed)
Established Patient Office Visit  Subjective    Patient ID: Jessica Ponce, female    DOB: [birth year]  Age: 57 y.o. MRN: [id number]  CC:  Chief Complaint  Patient presents with   Follow-up    HPI Jessica Ponce presents for routine follow up of chronic med issues. Patient reports that she had been having difficulties obtaining insulin and glucose monitors have yet to be approved. Patient also reports osme difficulty moving her right shoulder. She denies known trauma or injury. Sx have been progressive over several months. Se is left hand dominant.    Outpatient Encounter Medications as of 05/14/2023  Medication Sig   amLODipine (NORVASC) 10 MG tablet TAKE 1 TABLET BY MOUTH EVERY DAY   aspirin EC 81 MG tablet Take 1 tablet (81 mg total) by mouth daily. Swallow whole.   benzonatate (TESSALON) 100 MG capsule Take 1 capsule (100 mg total) by mouth every 8 (eight) hours as needed for cough.   buPROPion (WELLBUTRIN XL) 150 MG 24 hr tablet TAKE 1 TABLET BY MOUTH EVERY DAY   Continuous Blood Gluc Sensor (FREESTYLE LIBRE 2 SENSOR) MISC Utilize as directed to check blood glucose   Continuous Glucose Sensor (DEXCOM G6 SENSOR) MISC Check blood sugar before meals 3 times daily   gabapentin (NEURONTIN) 300 MG capsule TAKE 1 CAPSULE BY MOUTH THREE TIMES A DAY   Insulin Lispro Prot & Lispro (HUMALOG MIX 75/25 KWIKPEN) (75-25) 100 UNIT/ML Kwikpen Inject 20 Units into the skin 2 (two) times daily with a meal.   Insulin Pen Needle (BD PEN NEEDLE NANO 2ND GEN) 32G X 4 MM MISC 1 APPLICATION BY DOES NOT APPLY ROUTE DAILY.   Insulin Pen Needle 32G X 4 MM MISC Use as directed with insulin   Insulin Syringe-Needle U-100 (BD SAFETYGLIDE INSULIN SYRINGE) 31G X 15/64" 0.5 ML MISC Use to inject insulin twice daily.   losartan (COZAAR) 100 MG tablet TAKE 1 TABLET BY MOUTH EVERY DAY   omeprazole (PRILOSEC) 20 MG capsule Take 1 capsule (20 mg total) by mouth daily.   rosuvastatin (CRESTOR) 40 MG tablet Take 1 tablet (40  mg total) by mouth daily.   sertraline (ZOLOFT) 100 MG tablet Take 1 tablet (100 mg total) by mouth daily.   No facility-administered encounter medications on file as of 05/14/2023.    Past Medical History:  Diagnosis Date   Allergy    Asthma    no current problems, does not use inhaler   Diabetes mellitus without complication (HCC)    type 2   Essential hypertension    Hyperlipidemia     Past Surgical History:  Procedure Laterality Date   APPENDECTOMY  1985   exploratory laparoscopy     WISDOM TOOTH EXTRACTION      Family History  Problem Relation Age of Onset   Memory loss Mother    Breast cancer Mother 73   Stroke Father    Breast cancer Maternal Aunt 50   Colon cancer Neg Hx    Rectal cancer Neg Hx    Stomach cancer Neg Hx    Esophageal cancer Neg Hx     Social History   Socioeconomic History   Marital status: Married    Spouse name: [name]   Number of children: 2   Years of education: 16   Highest education level: Bachelor's degree (e.g., BA, AB, BS)  Occupational History   Not on file  Tobacco Use   Smoking status: Never    Passive exposure: Never  Smokeless tobacco: Never  Vaping Use   Vaping Use: Never used  Substance and Sexual Activity   Alcohol use: Yes    Alcohol/week: 0.0 standard drinks of alcohol    Comment: occasional wine   Drug use: No   Sexual activity: Yes    Birth control/protection: Post-menopausal  Other Topics Concern   Not on file  Social History Narrative   Lives with husband and 2 kids   Caffeine use: Drinks 2-3 cups coffee per day   Social Determinants of Health   Financial Resource Strain: Medium Risk (05/14/2023)   Overall Financial Resource Strain (CARDIA)    Difficulty of Paying Living Expenses: Somewhat hard  Food Insecurity: Food Insecurity Present (05/14/2023)   Hunger Vital Sign    Worried About Running Out of Food in the Last Year: Sometimes true    Ran Out of Food in the Last Year: Never true   Transportation Needs: No Transportation Needs (05/14/2023)   PRAPARE - Administrator, Civil Service (Medical): No    Lack of Transportation (Non-Medical): No  Physical Activity: Insufficiently Active (05/14/2023)   Exercise Vital Sign    Days of Exercise per Week: 2 days    Minutes of Exercise per Session: 20 min  Stress: Stress Concern Present (05/14/2023)   [employer] of Occupational Health - Occupational Stress Questionnaire    Feeling of Stress : Very much  Social Connections: Moderately Isolated (05/14/2023)   Social Connection and Isolation Panel [NHANES]    Frequency of Communication with Friends and Family: Once a week    Frequency of Social Gatherings with Friends and Family: Once a week    Attends Religious Services: 1 to 4 times per year    Active Member of [employer] or Organizations: No    Attends Engineer, structural: Not on file    Marital Status: Married  Catering manager Violence: Not on file    Review of Systems  All other systems reviewed and are negative.       Objective    BP 136/85   Pulse 78   Temp 98.1 F (36.7 C) (Oral)   Resp 16   Wt 212 lb 3.2 oz (96.3 kg)   LMP 12/10/2015 (LMP Unknown)   SpO2 94%   BMI 35.31 kg/m   Physical Exam Vitals and nursing note reviewed.  Constitutional:      General: She is not in acute distress. HENT:     Head: Normocephalic and atraumatic.  Cardiovascular:     Rate and Rhythm: Normal rate and regular rhythm.  Pulmonary:     Effort: Pulmonary effort is normal.     Breath sounds: Normal breath sounds.  Abdominal:     Palpations: Abdomen is soft.     Tenderness: There is no abdominal tenderness.  Musculoskeletal:     Right shoulder: Tenderness present. No swelling or deformity. Decreased range of motion.     Right lower leg: No edema.     Left lower leg: No edema.  Neurological:     General: No focal deficit present.     Mental Status: She is alert and oriented to person, place, and  time.  Psychiatric:        Mood and Affect: Mood normal.        Behavior: Behavior normal.         Assessment & Plan:   1. Type 2 diabetes mellitus with hyperglycemia, with long-term current use of insulin (HCC) Elevated A1c.  ? patient  now able to get meds and supplies - POCT glycosylated hemoglobin (Hb A1C) - Microalbumin / creatinine urine ratio - HM DIABETES FOOT EXAM  2. Left shoulder pain, unspecified chronicity Clinically would consider frozen shoulder. Referral to ortho for further eval/mgt - Ambulatory referral to Orthopedics    Return in about 3 months (around 08/14/2023) for follow up.   Tommie Raymond, MD

## 2023-05-17 NOTE — Telephone Encounter (Signed)
Pt states that her insurance is requiring a Prior Authorization for her prescription: Continuous Glucose Sensor (DEXCOM G6 SENSOR) MISC .    Please advise.

## 2023-05-19 LAB — SPECIMEN STATUS REPORT

## 2023-05-19 LAB — MICROALBUMIN / CREATININE URINE RATIO
Creatinine, Urine: 90.7 mg/dL
Microalb/Creat Ratio: 7 mg/g creat (ref 0–29)
Microalbumin, Urine: 6.1 ug/mL

## 2023-05-20 NOTE — Progress Notes (Addendum)
Office Visit Note   Patient: Jessica Ponce           Date of Birth: 04/06/1966           MRN: [id number] Visit Date: 05/21/2023              Requested by: Georganna Skeans, MD [address] [place],  [place] [address] PCP: Georganna Skeans, MD   Assessment & Plan: Visit Diagnoses:  1. Acute pain of right shoulder     Plan: Jessica Ponce is a 57 year old female with chronic right shoulder pain impression is adhesive capsulitis.  Her diabetes is uncontrolled.  We talked about the importance of controlling her diabetes and how this affects her condition.  We will send her to Dr. Shon Baton for glenohumeral injection and I have made a referral to outpatient PT.  She can follow-up with Dr. Shon Baton per his protocol.  Follow-Up Instructions: No follow-ups on file.   Orders:  Orders Placed This Encounter  Procedures   XR Shoulder Right   Ambulatory referral to Physical Therapy   No orders of the defined types were placed in this encounter.     Procedures: No procedures performed   Clinical Data: No additional findings.   Subjective: Chief Complaint  Patient presents with   Right Shoulder - Pain    HPI Jessica Ponce is a 57 year old female comes in for evaluation of right shoulder pain that started a few months ago.  Denies any injuries.  She is an uncontrolled diabetic.  Pain is getting worse and sometimes shoots into the hands.  She has been taking naproxen for this.  Denies any radicular symptoms or numbness and tingling. Review of Systems  Constitutional: Negative.   HENT: Negative.    Eyes: Negative.   Respiratory: Negative.    Cardiovascular: Negative.   Endocrine: Negative.   Musculoskeletal: Negative.   Neurological: Negative.   Hematological: Negative.   Psychiatric/Behavioral: Negative.    All other systems reviewed and are negative.    Objective: Vital Signs: LMP 12/10/2015 (LMP Unknown)   Physical Exam Vitals and nursing note reviewed.  Constitutional:       Appearance: She is well-developed.  HENT:     Head: Atraumatic.     Nose: Nose normal.  Eyes:     Extraocular Movements: Extraocular movements intact.  Cardiovascular:     Pulses: Normal pulses.  Pulmonary:     Effort: Pulmonary effort is normal.  Abdominal:     Palpations: Abdomen is soft.  Musculoskeletal:     Cervical back: Neck supple.  Skin:    General: Skin is warm.     Capillary Refill: Capillary refill takes less than 2 seconds.  Neurological:     Mental Status: She is alert. Mental status is at baseline.  Psychiatric:        Behavior: Behavior normal.        Thought Content: Thought content normal.        Judgment: Judgment normal.    Ortho Exam Examination of right shoulder shows moderate pain with range of motion in all planes.  She has moderately decreased range of motion.  Manual muscle testing of the rotator cuff is normal. Specialty Comments:  No specialty comments available.  Imaging: XR Shoulder Right  Result Date: 05/21/2023 X-rays show mild degenerative changes to the [facility] joint otherwise x-rays are unremarkable.    PMFS History: Patient Active Problem List   Diagnosis Date Noted   Complicated migraine 03/21/2023   Acute ischemic stroke (HCC)  05/27/2022   Type 2 diabetes mellitus with hyperglycemia (HCC) 10/07/2021   SVT (supraventricular tachycardia) 10/07/2021   Family history of thyroid disorder 07/15/2020   Mixed hyperlipidemia 04/23/2020   Family history of breast cancer 03/11/2020   Type 2 diabetes mellitus (HCC) 03/11/2020   Hypertension, essential 07/31/2016   Family history of Alzheimer's disease 08/04/2009   Past Medical History:  Diagnosis Date   Allergy    Asthma    no current problems, does not use inhaler   Diabetes mellitus without complication (HCC)    type 2   Essential hypertension    Hyperlipidemia     Family History  Problem Relation Age of Onset   Memory loss Mother    Breast cancer Mother 55   Stroke Father     Breast cancer Maternal Aunt 56   Colon cancer Neg Hx    Rectal cancer Neg Hx    Stomach cancer Neg Hx    Esophageal cancer Neg Hx     Past Surgical History:  Procedure Laterality Date   APPENDECTOMY  1985   exploratory laparoscopy     WISDOM TOOTH EXTRACTION     Social History   Occupational History   Not on file  Tobacco Use   Smoking status: Never    Passive exposure: Never   Smokeless tobacco: Never  Vaping Use   Vaping Use: Never used  Substance and Sexual Activity   Alcohol use: Yes    Alcohol/week: 0.0 standard drinks of alcohol    Comment: occasional wine   Drug use: No   Sexual activity: Yes    Birth control/protection: Post-menopausal

## 2023-05-21 ENCOUNTER — Ambulatory Visit: Payer: Medicaid Other | Admitting: Orthopaedic Surgery

## 2023-05-21 ENCOUNTER — Other Ambulatory Visit: Payer: Self-pay

## 2023-05-21 ENCOUNTER — Encounter: Payer: Self-pay | Admitting: Sports Medicine

## 2023-05-21 ENCOUNTER — Other Ambulatory Visit (INDEPENDENT_AMBULATORY_CARE_PROVIDER_SITE_OTHER): Payer: Medicaid Other

## 2023-05-21 DIAGNOSIS — M25511 Pain in right shoulder: Secondary | ICD-10-CM | POA: Diagnosis not present

## 2023-05-21 DIAGNOSIS — M7501 Adhesive capsulitis of right shoulder: Secondary | ICD-10-CM | POA: Diagnosis not present

## 2023-05-21 MED ORDER — LIDOCAINE HCL 1 % IJ SOLN
2.0000 mL | INTRAMUSCULAR | Status: AC | PRN
Start: 2023-05-21 — End: 2023-05-21
  Administered 2023-05-21: 2 mL

## 2023-05-21 MED ORDER — BUPIVACAINE HCL 0.25 % IJ SOLN
2.0000 mL | INTRAMUSCULAR | Status: AC | PRN
Start: 2023-05-21 — End: 2023-05-21
  Administered 2023-05-21: 2 mL via INTRA_ARTICULAR

## 2023-05-21 MED ORDER — METHYLPREDNISOLONE ACETATE 40 MG/ML IJ SUSP
40.0000 mg | INTRAMUSCULAR | Status: AC | PRN
Start: 2023-05-21 — End: 2023-05-21
  Administered 2023-05-21: 40 mg via INTRA_ARTICULAR

## 2023-05-21 NOTE — Progress Notes (Signed)
   Procedure Note  Patient: Jessica Ponce             Date of Birth: 07-17-1966           MRN: [id number]             Visit Date: 05/21/2023  Procedures: Visit Diagnoses:  1. Acute pain of right shoulder   2. Adhesive capsulitis of right shoulder    Large Joint Inj: R glenohumeral on 05/21/2023 9:12 AM Indications: pain Details: 22 G 3.5 in needle, ultrasound-guided posterior approach Medications: 2 mL lidocaine 1 %; 2 mL bupivacaine 0.25 %; 40 mg methylPREDNISolone acetate 40 MG/ML Outcome: tolerated well, no immediate complications  US-guided glenohumeral joint injection, right shoulder After discussion on risks/benefits/indications, informed verbal consent was obtained. A timeout was then performed. The patient was positioned lying lateral recumbent on examination table. The patient's shoulder was prepped with betadine and multiple alcohol swabs and utilizing ultrasound guidance, the patient's glenohumeral joint was identified on ultrasound. Using ultrasound guidance a 22-gauge, 3.5 inch needle with a mixture of 2:2:1 cc's lidocaine:bupivicaine:depomedrol was directed from a lateral to medial direction via in-plane technique into the glenohumeral joint with visualization of appropriate spread of injectate into the joint. Patient tolerated the procedure well without immediate complications.      Procedure, treatment alternatives, risks and benefits explained, specific risks discussed. Consent was given by the patient. Immediately prior to procedure a time out was called to verify the correct patient, procedure, equipment, support staff and site/side marked as required. Patient was prepped and draped in the usual sterile fashion.    - I evaluated the patient about 5 minutes post-injection and she had improvement in pain and range of motion - I will see her back in about 2 weeks for re-evaluation and consideration of repeat injection (high-volume possible) - follow-up with Dr. Roda Shutters then as  indicated  Madelyn Brunner, DO Primary Care Sports Medicine Physician  [facility] - Orthopedics  This note was dictated using Dragon naturally speaking software and may contain errors in syntax, spelling, or content which have not been identified prior to signing this note.

## 2023-06-01 NOTE — Therapy (Signed)
OUTPATIENT PHYSICAL THERAPY SHOULDER EVALUATION   Patient Name: Nashawn Prum MRN: [id number] DOB:08/18/1966, 57 y.o., female Today's Date: 06/03/2023  END OF SESSION:  Outpatient Rehab from 06/02/2023 in [facility]    06/02/2023   0723  PT Visits / Re-Eval   Visit Number 1  Number of Visits 13  Date for PT Re-Evaluation 07/23/2023  Authorization   Authorization Type [place] MEDICA[employer] [place]UE  Authorization Time Period --  Authorization - Visit Number --  Authorization - Number of Visits --  Progress Note Due on Visit --  PT Time Calculation   PT Start Time 0716  PT Stop Time 0800  PT Time Calculation (min) 44 min  PT - End of Session   Equipment Utilized During Treatment --  Activity Tolerance Patient tolerated treatment well  Behavior During Therapy WFL for tasks assessed/performed      Past Medical History:  Diagnosis Date   Allergy    Asthma    no current problems, does not use inhaler   Diabetes mellitus without complication (HCC)    type 2   Essential hypertension    Hyperlipidemia    Past Surgical History:  Procedure Laterality Date   APPENDECTOMY  1985   exploratory laparoscopy     WISDOM TOOTH EXTRACTION     Patient Active Problem List   Diagnosis Date Noted   Complicated migraine 03/21/2023   Acute ischemic stroke (HCC) 05/27/2022   Type 2 diabetes mellitus with hyperglycemia (HCC) 10/07/2021   SVT (supraventricular tachycardia) 10/07/2021   Family history of thyroid disorder 07/15/2020   Mixed hyperlipidemia 04/23/2020   Family history of breast cancer 03/11/2020   Type 2 diabetes mellitus (HCC) 03/11/2020   Hypertension, essential 07/31/2016   Family history of Alzheimer's disease 08/04/2009    PCP: Georganna Skeans, MD  REFERRING PROVIDER: Tarry Kos, MD  REFERRING DIAG: M25.511 (ICD-10-CM) - Acute pain of right shoulder   THERAPY DIAG:  Acute pain of right shoulder  Muscle weakness  (generalized)  Rationale for Evaluation and Treatment: Rehabilitation  ONSET DATE: 6 months or greater  SUBJECTIVE:                                                                                                                                                                                      SUBJECTIVE STATEMENT: Pt reports the pain initiated with R biceps and progressed to her shoulder. An injection she received in the R shoulder has helped to reduce the pain and improve motion.  Hand dominance: Left  PERTINENT HISTORY: DM, high BMI  PAIN:  Are you having pain? Yes: NPRS scale: 1/10 Pain location: R  shoulder, biceps Pain description: throb, Charley horse feeling Aggravating factors: Reaching above and out to the side Relieving factors: Rest, limited use Pain range on eval: 0-4/10   PRECAUTIONS: None  WEIGHT BEARING RESTRICTIONS: No  FALLS:  Has patient fallen in last 6 months? No  LIVING ENVIRONMENT: Lives with: lives with their family Lives in: House/apartment No issue with accessing or mobility within home  OCCUPATION: Research scientist (life sciences); enjoys knitting  PLOF: Independent  PATIENT GOALS: Better ROM with less pain  NEXT MD VISIT:   OBJECTIVE:   DIAGNOSTIC FINDINGS Xray 05/21/23 X-rays show mild degenerative changes to the [facility] joint otherwise x-rays are  unremarkable.   PATIENT SURVEYS:  FOTO: Perceived function   46%, predicted   57%   COGNITION: Overall cognitive status: Within functional limits for tasks assessed     SENSATION: WFL  POSTURE: Forward head, rounded shoulders, increased thoracic kyphosis  UPPER EXTREMITY ROM:   Active ROM Right eval Left eval  Shoulder flexion A120, P120, pain at end range 140  Shoulder extension    Shoulder abduction    Shoulder adduction    Shoulder internal rotation A to PSIS, P60, pain at end range T  Shoulder external rotation A to T2, P60, pain at end range T4  Elbow flexion    Elbow extension     Wrist flexion    Wrist extension    Wrist ulnar deviation    Wrist radial deviation    Wrist pronation    Wrist supination    (Blank rows = not tested)  UPPER EXTREMITY MMT:  MMT Right eval Left eval  Shoulder flexion 4 pain   Shoulder extension    Shoulder abduction 4 pain   Shoulder adduction    Shoulder internal rotation 4 Pain   Shoulder external rotation 4 pain   Middle trapezius    Lower trapezius    Elbow flexion 4 pain   Elbow extension    Wrist flexion    Wrist extension    Wrist ulnar deviation    Wrist radial deviation    Wrist pronation    Wrist supination    Grip strength (lbs)    (Blank rows = not tested)  SHOULDER SPECIAL TESTS: Impingement tests: Hawkins/Kennedy impingement test: negative SLAP lesions: Crank test: negative Rotator cuff assessment: Empty can test: negative pain without weakness Biceps assessment: Yergason's test: positive   JOINT MOBILITY TESTING:  WNLs  PALPATION:  TTP to ant shoulder and along the prox bicipital tendon    TODAY'S TREATMENT:    [facility] Adult PT Treatment:                                                DATE: 06/02/23 Therapeutic Exercise: Developed, instructed in, and pt completed therex as noted in HEP  Self Care: Use of cold pack for 10 mins for pain     management  PATIENT EDUCATION: Education details: Eval findings, POC, HEP, self care Person educated: Patient Education method: Explanation, Demonstration, Tactile cues, Verbal cues, and Handouts Education comprehension: verbalized understanding, returned demonstration, verbal cues required, and tactile cues required  HOME EXERCISE PROGRAM: Developed, instructed in, and pt completed therex as noted in HEP Access Code: [email or web address] URL: [email or web address] Date: 06/02/2023 Prepared by: Joellyn Rued  Exercises -  Standing Shoulder Row with Anchored Resistance  - 1 x daily - 7 x weekly - 2 sets - 10 reps - 3 hold - Standing Single Arm Elbow Flexion with Resistance  - 1 x daily - 7 x weekly - 2 sets - 10 reps - 3 hold - Forearm Pronation and Supination with Hammer  - 1 x daily - 7 x weekly - 2 sets - 10 reps - 3 hold  ASSESSMENT:  CLINICAL IMPRESSION: Patient is a 58 y.o. female who was seen today for physical therapy evaluation and treatment for M25.511 (ICD-10-CM) - Acute pain of right shoulder . On today's eval pt presented with min/mod limitations for R shoulder ROMs globally. ROM was and strength were limited by pain. 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 Pt will benefit from skilled PT 2w6 to address impairments to optimize function with less pain.   OBJECTIVE IMPAIRMENTS: decreased activity tolerance, decreased ROM, decreased strength, impaired UE functional use, postural dysfunction, obesity, and pain.   ACTIVITY LIMITATIONS: carrying, lifting, sleeping, and reach over head  PARTICIPATION LIMITATIONS: meal prep, cleaning, laundry, and hobby-knitting  PERSONAL FACTORS: Fitness, Past/current experiences, Time since onset of injury/illness/exacerbation, and 1-2 comorbidities: DM, high BMI  are also affecting patient's functional outcome.   REHAB POTENTIAL: Good  CLINICAL DECISION MAKING: Stable/uncomplicated  EVALUATION COMPLEXITY: Low   GOALS:  SHORT TERM GOALS: Target date: 06/25/23  Pt will be Ind in an initial HEP Baseline: Started Goal status: INITIAL  2.  Pt will voice understanding of measures to assist in pain reduction  Baseline: Started Goal status: INITIAL  LONG TERM GOALS: Target date: 07/23/23  Pt will be Ind in a final HEP to maintain achieved LOF  Baseline: started Goal status: INITIAL  2.  Increaser shoulder AROM for flexion to 135d, ER to T3, and IR L1 for improved functional use of the R shoulder/UE Baseline: see flow sheets Goal status:  INITIAL  3.  Increase R shoulder and bicep strength to 4+ or greater for improved function of the R shoulder/UE Baseline:  Goal status: INITIAL  4.  Pt will report a decrease in R shoulder pain to 0-2/10 or less for improved R shoulder/UE function and QOL Baseline:0-4/10  Goal status: INITIAL  5.  Pt's FOTO score will improved to the predicted value of 57% as indication of improved function  Baseline: 46% Goal status: INITIAL  PLAN:  PT FREQUENCY: 2x/week  PT DURATION: 6 weeks  PLANNED INTERVENTIONS: Therapeutic exercises, Therapeutic activity, Neuromuscular re-education, Balance training, Gait training, Patient/Family education, Self Care, Joint mobilization, Aquatic Therapy, Dry Needling, Electrical stimulation, Cryotherapy, Moist heat, Taping, Vasopneumatic device, Ultrasound, Ionotophoresis 4mg /ml Dexamethasone, Manual therapy, and Re-evaluation  PLAN FOR NEXT SESSION: Review FOTO; assess response to HEP; progress therex as indicated; use of modalities, manual therapy; and TPDN as indicated.  Mikayela Deats MS, PT 06/03/23 9:49 PM  Check all possible CPT codes: 40981 - PT Re-evaluation, 97110- Therapeutic Exercise, [phone]- Neuro Re-education, [phone] - Gait Training, [phone] - Manual Therapy, 97530 - Therapeutic Activities, 97535 - Self Care, [phone] - Electrical stimulation (Manual), [id number] - Ultrasound, and [id number] -  Aquatic therapy    Check all conditions that are expected to impact treatment: {Conditions expected to impact treatment:Diabetes mellitus and Musculoskeletal disorders   If treatment provided at initial evaluation, no treatment charged due to lack of authorization.

## 2023-06-02 ENCOUNTER — Ambulatory Visit: Payer: Medicaid Other | Attending: Orthopaedic Surgery

## 2023-06-02 ENCOUNTER — Other Ambulatory Visit: Payer: Self-pay

## 2023-06-02 DIAGNOSIS — M25511 Pain in right shoulder: Secondary | ICD-10-CM | POA: Insufficient documentation

## 2023-06-02 DIAGNOSIS — M6281 Muscle weakness (generalized): Secondary | ICD-10-CM | POA: Diagnosis present

## 2023-06-10 ENCOUNTER — Telehealth: Payer: Self-pay

## 2023-06-10 ENCOUNTER — Other Ambulatory Visit: Payer: Self-pay

## 2023-06-10 NOTE — Telephone Encounter (Signed)
A prior authorization request for Dexcom G6 sensors has been submitted to insurance via CoverMyMeds today Key: [id number]

## 2023-06-11 ENCOUNTER — Other Ambulatory Visit: Payer: Self-pay

## 2023-06-11 ENCOUNTER — Telehealth: Payer: Self-pay

## 2023-06-11 NOTE — Telephone Encounter (Signed)
A PRIOR AUTHORIZATION REQUEST HAS BEEN SUBMITTED FOR DEXCOM G6 SENSOR TO [employer] TODAY VIA COVER MY MEDS Key: [id number]

## 2023-06-11 NOTE — Telephone Encounter (Signed)
PRIOR AUTHORIZATION HAS BEEN RESOLVED/NOT NEEDED. PT IN GRACE PERIOD DUE TO NOT PAYING PREMIUM, COPAY $366. [employer] PLUS([facility])

## 2023-06-11 NOTE — Telephone Encounter (Signed)
DEXCOM G6 SENSOR PRIOR AUTH HAS BEEN APPROVED BY [employer] UNTIL 12/07/2023

## 2023-06-14 ENCOUNTER — Other Ambulatory Visit: Payer: Self-pay

## 2023-06-16 ENCOUNTER — Ambulatory Visit: Payer: Medicaid Other

## 2023-06-16 ENCOUNTER — Ambulatory Visit: Payer: Medicaid Other | Admitting: Sports Medicine

## 2023-06-17 ENCOUNTER — Other Ambulatory Visit: Payer: Self-pay | Admitting: Family Medicine

## 2023-06-17 ENCOUNTER — Telehealth: Payer: Self-pay | Admitting: Family Medicine

## 2023-06-17 DIAGNOSIS — E1165 Type 2 diabetes mellitus with hyperglycemia: Secondary | ICD-10-CM

## 2023-06-17 NOTE — Therapy (Signed)
OUTPATIENT PHYSICAL THERAPY SHOULDER TREATMENT NOTE   Patient Name: Jessica Ponce MRN: [id number] DOB:04/18/1966, 57 y.o., female Today's Date: 06/18/2023  END OF SESSION:   PT End of Session - 06/18/23 2020     Visit Number 2    Number of Visits 13    Date for PT Re-Evaluation 07/23/23    Authorization Type [place] MEDICAID HEALTHY BLUE    Authorization Time Period Appoved 7 visits 06/02/23-07/31/23    PT Start Time 1021    PT Stop Time 1103    PT Time Calculation (min) 42 min    Activity Tolerance Patient tolerated treatment well    Behavior During Therapy WFL for tasks assessed/performed             Past Medical History:  Diagnosis Date   Allergy    Asthma    no current problems, does not use inhaler   Diabetes mellitus without complication (HCC)    type 2   Essential hypertension    Hyperlipidemia    Past Surgical History:  Procedure Laterality Date   APPENDECTOMY  1985   exploratory laparoscopy     WISDOM TOOTH EXTRACTION     Patient Active Problem List   Diagnosis Date Noted   Complicated migraine 03/21/2023   Acute ischemic stroke (HCC) 05/27/2022   Type 2 diabetes mellitus with hyperglycemia (HCC) 10/07/2021   SVT (supraventricular tachycardia) 10/07/2021   Family history of thyroid disorder 07/15/2020   Mixed hyperlipidemia 04/23/2020   Family history of breast cancer 03/11/2020   Type 2 diabetes mellitus (HCC) 03/11/2020   Hypertension, essential 07/31/2016   Family history of Alzheimer's disease 08/04/2009    PCP: Georganna Skeans, MD  REFERRING PROVIDER: Tarry Kos, MD  REFERRING DIAG: M25.511 (ICD-10-CM) - Acute pain of right shoulder   THERAPY DIAG:  Acute pain of right shoulder  Muscle weakness (generalized)  Rationale for Evaluation and Treatment: Rehabilitation  ONSET DATE: 6 months or greater  SUBJECTIVE:                                                                                                                                                                                       SUBJECTIVE STATEMENT: Pt reports overall her R shoulder is doing better.  EVAL: Pt reports the pain initiated with R biceps and progressed to her shoulder. An injection she received in the R shoulder has helped to reduce the pain and improve motion.  Hand dominance: Left  PERTINENT HISTORY: DM, high BMI  PAIN:  Are you having pain? Yes: NPRS scale: 0-1/10 Pain location: R shoulder, biceps Pain description: throb, Charley horse feeling Aggravating factors: Reaching above and out to  the side Relieving factors: Rest, limited use Pain range on eval: 0-4/10 past week 0-4/10 certain movements  PERTINENT HISTORY: DM, high BMI  PRECAUTIONS: None  WEIGHT BEARING RESTRICTIONS: No  FALLS:  Has patient fallen in last 6 months? No  LIVING ENVIRONMENT: Lives with: lives with their family Lives in: House/apartment No issue with accessing or mobility within home  OCCUPATION: Research scientist (life sciences); enjoys knitting  PLOF: Independent  PATIENT GOALS: Better ROM with less pain  NEXT MD VISIT:   OBJECTIVE:   DIAGNOSTIC FINDINGS Xray 05/21/23 X-rays show mild degenerative changes to the [facility] joint otherwise x-rays are  unremarkable.   PATIENT SURVEYS:  FOTO: Perceived function   46%, predicted   57%   COGNITION: Overall cognitive status: Within functional limits for tasks assessed     SENSATION: WFL  POSTURE: Forward head, rounded shoulders, increased thoracic kyphosis  UPPER EXTREMITY ROM:   Active ROM Right eval Left eval  Shoulder flexion A120, P120, pain at end range 140  Shoulder extension    Shoulder abduction    Shoulder adduction    Shoulder internal rotation A to PSIS, P60, pain at end range T  Shoulder external rotation A to T2, P60, pain at end range T4  Elbow flexion    Elbow extension    Wrist flexion    Wrist extension    Wrist ulnar deviation    Wrist radial deviation    Wrist pronation    Wrist  supination    (Blank rows = not tested)  UPPER EXTREMITY MMT:  MMT Right eval Left eval  Shoulder flexion 4 pain   Shoulder extension    Shoulder abduction 4 pain   Shoulder adduction    Shoulder internal rotation 4 Pain   Shoulder external rotation 4 pain   Middle trapezius    Lower trapezius    Elbow flexion 4 pain   Elbow extension    Wrist flexion    Wrist extension    Wrist ulnar deviation    Wrist radial deviation    Wrist pronation    Wrist supination    Grip strength (lbs)    (Blank rows = not tested)  SHOULDER SPECIAL TESTS: Impingement tests: Hawkins/Kennedy impingement test: negative SLAP lesions: Crank test: negative Rotator cuff assessment: Empty can test: negative pain without weakness Biceps assessment: Yergason's test: positive   JOINT MOBILITY TESTING:  WNLs  PALPATION:  TTP to ant shoulder and along the prox bicipital tendon    TODAY'S TREATMENT:   [facility] Adult PT Treatment:                                                DATE: 06/18/23 Therapeutic Exercise: 30d pectoral stretch x3 30" Standing Shoulder Row 2x10 reps Standing Single Arm Elbow Flexion RTB 2x10 reps Forearm Pronation and Supination 2# 2x10 reps Shoulder ER 2x8 YTB Upper trap/GH jt stretch x3 30" Updated HEP Manual Therapy: Cross friction massage to the anterior [facility] jt/bicipital tendon Self Care: Instruction in cross friction massage- pt returned demonstration    [facility] Adult PT Treatment:                                                DATE: 06/02/23 Therapeutic Exercise: Developed,  instructed in, and pt completed therex as noted in HEP  Self Care: Use of cold pack for 10 mins for pain     management                                                                                                                                        PATIENT EDUCATION: Education details: Eval findings, POC, HEP, self care Person educated: Patient Education method: Explanation, Demonstration,  Tactile cues, Verbal cues, and Handouts Education comprehension: verbalized understanding, returned demonstration, verbal cues required, and tactile cues required  HOME EXERCISE PROGRAM: Access Code: [email or web address] URL: [email or web address] Date: 06/18/2023 Prepared by: Joellyn Rued  Exercises - Standing Shoulder Row with Anchored Resistance  - 1 x daily - 7 x weekly - 2-3 sets - 10 reps - 3 hold - Standing Single Arm Elbow Flexion with Resistance  - 1 x daily - 7 x weekly - 2-3 sets - 10 reps - 3 hold - Forearm Pronation and Supination with Hammer  - 1 x daily - 7 x weekly - 2-3 sets - 10 reps - 3 hold - Shoulder External Rotation and Scapular Retraction with Resistance  - 1 x daily - 7 x weekly - 2-3 sets - 10 reps - 3 hold - Corner Pec Minor Stretch  - 1 x daily - 7 x weekly - 1 sets - 3 reps - 30 hold - Seated Upper Trapezius Stretch  - 1 x daily - 7 x weekly - 1 sets - 3 reps - 30 hold  ASSESSMENT:  CLINICAL IMPRESSION: PT was completed for cross friction massage with Pt Ed in how to complete. Pt returned demonstration. Therex was then completed for R shoulder flexibility and strengthening. Pt tolerated the prescribed therex reporting fatigue with the resistance therex. The HEP was updated. Pt will continue to benefit from skilled PT to address impairments for improved R shoulder function with less pain.    EVAL: Patient is a 57 y.o. female who was seen today for physical therapy evaluation and treatment for M25.511 (ICD-10-CM) - Acute pain of right shoulder . On today's eval pt presented with min/mod limitations for R shoulder ROMs globally. ROM was and strength were limited by pain. 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 Pt will benefit from skilled PT 2w6 to address impairments to optimize function with less pain.   OBJECTIVE IMPAIRMENTS: decreased activity tolerance, decreased ROM, decreased strength, impaired UE functional use, postural dysfunction,  obesity, and pain.   ACTIVITY LIMITATIONS: carrying, lifting, sleeping, and reach over head  PARTICIPATION LIMITATIONS: meal prep, cleaning, laundry, and hobby-knitting  PERSONAL FACTORS: Fitness, Past/current experiences, Time since onset of injury/illness/exacerbation, and 1-2 comorbidities: DM, high BMI  are also affecting patient's functional outcome.   REHAB POTENTIAL: Good  CLINICAL DECISION MAKING: Stable/uncomplicated  EVALUATION COMPLEXITY: Low   GOALS:  SHORT TERM GOALS: Target date: 06/25/23  Pt will be Ind  in an initial HEP Baseline: Started Goal status: Ongoing  2.  Pt will voice understanding of measures to assist in pain reduction  Baseline: Started Goal status: Ongoing  LONG TERM GOALS: Target date: 07/23/23  Pt will be Ind in a final HEP to maintain achieved LOF  Baseline: started Goal status: INITIAL  2.  Increaser shoulder AROM for flexion to 135d, ER to T3, and IR L1 for improved functional use of the R shoulder/UE Baseline: see flow sheets Goal status: INITIAL  3.  Increase R shoulder and bicep strength to 4+ or greater for improved function of the R shoulder/UE Baseline:  Goal status: INITIAL  4.  Pt will report a decrease in R shoulder pain to 0-2/10 or less for improved R shoulder/UE function and QOL Baseline:0-4/10  Goal status: INITIAL  5.  Pt's FOTO score will improved to the predicted value of 57% as indication of improved function  Baseline: 46% Goal status: INITIAL  PLAN:  PT FREQUENCY: 2x/week  PT DURATION: 6 weeks  PLANNED INTERVENTIONS: Therapeutic exercises, Therapeutic activity, Neuromuscular re-education, Balance training, Gait training, Patient/Family education, Self Care, Joint mobilization, Aquatic Therapy, Dry Needling, Electrical stimulation, Cryotherapy, Moist heat, Taping, Vasopneumatic device, Ultrasound, Ionotophoresis 4mg /ml Dexamethasone, Manual therapy, and Re-evaluation  PLAN FOR NEXT SESSION: Review FOTO; assess  response to HEP; progress therex as indicated; use of modalities, manual therapy; and TPDN as indicated.  Ridley Schewe MS, PT 06/18/23 8:35 PM

## 2023-06-17 NOTE — Telephone Encounter (Signed)
Pt called asking for transmitter that goes with the Dexcom G6.  Filed every 90 days.  [facility] [address]  CB#  [phone]

## 2023-06-18 ENCOUNTER — Ambulatory Visit: Payer: Medicaid Other

## 2023-06-18 DIAGNOSIS — M25511 Pain in right shoulder: Secondary | ICD-10-CM

## 2023-06-18 DIAGNOSIS — M6281 Muscle weakness (generalized): Secondary | ICD-10-CM

## 2023-06-18 MED ORDER — DEXCOM G6 TRANSMITTER MISC
1 refills | Status: DC
Start: 2023-06-18 — End: 2023-10-06

## 2023-06-20 NOTE — Telephone Encounter (Signed)
Thank you :)

## 2023-06-22 ENCOUNTER — Telehealth: Payer: Self-pay | Admitting: Family Medicine

## 2023-06-22 ENCOUNTER — Other Ambulatory Visit: Payer: Self-pay | Admitting: *Deleted

## 2023-06-22 MED ORDER — DEXCOM G6 SENSOR MISC: 2 refills | Status: DC

## 2023-06-22 NOTE — Telephone Encounter (Signed)
done

## 2023-06-22 NOTE — Telephone Encounter (Signed)
Pt stated she needs PCP to submit PA for Continuous Glucose Transmitter (DEXCOM G6 TRANSMITTER) MISC.   Please advise.

## 2023-06-23 ENCOUNTER — Ambulatory Visit: Payer: Medicaid Other | Admitting: Physical Therapy

## 2023-06-24 ENCOUNTER — Ambulatory Visit: Payer: Medicaid Other | Admitting: Sports Medicine

## 2023-06-24 NOTE — Therapy (Signed)
OUTPATIENT PHYSICAL THERAPY SHOULDER TREATMENT NOTE   Patient Name: Jessica Ponce MRN: [id number] DOB:03-Sep-1966, 57 y.o., female Today's Date: 06/25/2023  END OF SESSION:   PT End of Session - 06/25/23 1046     Visit Number 3    Number of Visits 13    Date for PT Re-Evaluation 07/23/23    Authorization Type [place] MEDICAID HEALTHY BLUE    Authorization Time Period Appoved 7 visits 06/02/23-07/31/23    PT Start Time 1020    PT Stop Time 1102    PT Time Calculation (min) 42 min    Activity Tolerance Patient tolerated treatment well    Behavior During Therapy WFL for tasks assessed/performed              Past Medical History:  Diagnosis Date   Allergy    Asthma    no current problems, does not use inhaler   Diabetes mellitus without complication (HCC)    type 2   Essential hypertension    Hyperlipidemia    Past Surgical History:  Procedure Laterality Date   APPENDECTOMY  1985   exploratory laparoscopy     WISDOM TOOTH EXTRACTION     Patient Active Problem List   Diagnosis Date Noted   Complicated migraine 03/21/2023   Acute ischemic stroke (HCC) 05/27/2022   Type 2 diabetes mellitus with hyperglycemia (HCC) 10/07/2021   SVT (supraventricular tachycardia) 10/07/2021   Family history of thyroid disorder 07/15/2020   Mixed hyperlipidemia 04/23/2020   Family history of breast cancer 03/11/2020   Type 2 diabetes mellitus (HCC) 03/11/2020   Hypertension, essential 07/31/2016   Family history of Alzheimer's disease 08/04/2009    PCP: Georganna Skeans, MD  REFERRING PROVIDER: Tarry Kos, MD  REFERRING DIAG: M25.511 (ICD-10-CM) - Acute pain of right shoulder   THERAPY DIAG:  Acute pain of right shoulder  Muscle weakness (generalized)  Rationale for Evaluation and Treatment: Rehabilitation  ONSET DATE: 6 months or greater  SUBJECTIVE:                                                                                                                                                                                       SUBJECTIVE STATEMENT: Pt reports she is overall better, but the R anterior shoulder has been bothering her more the past 2 days  EVAL: Pt reports the pain initiated with R biceps and progressed to her shoulder. An injection she received in the R shoulder has helped to reduce the pain and improve motion.  Hand dominance: Left  PERTINENT HISTORY: DM, high BMI  PAIN:  Are you having pain? Yes: NPRS scale: 0-1/10 Pain location: R shoulder, biceps Pain  description: throb, Charley horse feeling Aggravating factors: Reaching above and out to the side Relieving factors: Rest, limited use Pain range on eval: 0-4/10 past week 0-4/10 certain movements  PERTINENT HISTORY: DM, high BMI  PRECAUTIONS: None  WEIGHT BEARING RESTRICTIONS: No  FALLS:  Has patient fallen in last 6 months? No  LIVING ENVIRONMENT: Lives with: lives with their family Lives in: House/apartment No issue with accessing or mobility within home  OCCUPATION: Research scientist (life sciences); enjoys knitting  PLOF: Independent  PATIENT GOALS: Better ROM with less pain  NEXT MD VISIT:   OBJECTIVE:   DIAGNOSTIC FINDINGS Xray 05/21/23 X-rays show mild degenerative changes to the [facility] joint otherwise x-rays are  unremarkable.   PATIENT SURVEYS:  FOTO: Perceived function   46%, predicted   57%   COGNITION: Overall cognitive status: Within functional limits for tasks assessed     SENSATION: WFL  POSTURE: Forward head, rounded shoulders, increased thoracic kyphosis  UPPER EXTREMITY ROM:   Active ROM Right eval Left eval  Shoulder flexion A120, P120, pain at end range 140  Shoulder extension    Shoulder abduction    Shoulder adduction    Shoulder internal rotation A to PSIS, P60, pain at end range T  Shoulder external rotation A to T2, P60, pain at end range T4  Elbow flexion    Elbow extension    Wrist flexion    Wrist extension    Wrist ulnar deviation     Wrist radial deviation    Wrist pronation    Wrist supination    (Blank rows = not tested)  UPPER EXTREMITY MMT:  MMT Right eval Left eval  Shoulder flexion 4 pain   Shoulder extension    Shoulder abduction 4 pain   Shoulder adduction    Shoulder internal rotation 4 Pain   Shoulder external rotation 4 pain   Middle trapezius    Lower trapezius    Elbow flexion 4 pain   Elbow extension    Wrist flexion    Wrist extension    Wrist ulnar deviation    Wrist radial deviation    Wrist pronation    Wrist supination    Grip strength (lbs)    (Blank rows = not tested)  SHOULDER SPECIAL TESTS: Impingement tests: Hawkins/Kennedy impingement test: negative SLAP lesions: Crank test: negative Rotator cuff assessment: Empty can test: negative pain without weakness Biceps assessment: Yergason's test: positive   JOINT MOBILITY TESTING:  WNLs  PALPATION:  TTP to ant shoulder and along the prox bicipital tendon    TODAY'S TREATMENT:   [facility] Adult PT Treatment:                                                DATE: 06/25/23 Therapeutic Exercise: 30d pectoral stretch x3 30" Standing Shoulder Row 2x10 reps RTB Standing Single Arm Elbow Flexion 4# 2x10 reps Forearm Pronation and Supination 2# 2x10 reps Shoulder ER 2x8 YTB Upper trap/GH jt stretch x3 30" Modalities: Iontophoresis to the R anterior shoulder, dexamethosone, 4mg /ml, pt to wear 6 hours Pt advised re: skin irritation Self Care: Use of cold pack for 10 -15 mins for pain management   [facility] Adult PT Treatment:  DATE: 06/18/23 Therapeutic Exercise: 30d pectoral stretch x3 30" Standing Shoulder Row 2x10 reps Standing Single Arm Elbow Flexion RTB 2x10 reps Forearm Pronation and Supination 2# 2x10 reps Shoulder ER 2x8 YTB Upper trap/GH jt stretch x3 30" Updated HEP Manual Therapy: Cross friction massage to the anterior [facility] jt/bicipital tendon Self Care: Instruction in cross  friction massage- pt returned demonstration                                                                                                                               PATIENT EDUCATION: Education details: Eval findings, POC, HEP, self care Person educated: Patient Education method: Explanation, Demonstration, Tactile cues, Verbal cues, and Handouts Education comprehension: verbalized understanding, returned demonstration, verbal cues required, and tactile cues required  HOME EXERCISE PROGRAM: Access Code: [email or web address] URL: [email or web address] Date: 06/18/2023 Prepared by: Joellyn Rued  Exercises - Standing Shoulder Row with Anchored Resistance  - 1 x daily - 7 x weekly - 2-3 sets - 10 reps - 3 hold - Standing Single Arm Elbow Flexion with Resistance  - 1 x daily - 7 x weekly - 2-3 sets - 10 reps - 3 hold - Forearm Pronation and Supination with Hammer  - 1 x daily - 7 x weekly - 2-3 sets - 10 reps - 3 hold - Shoulder External Rotation and Scapular Retraction with Resistance  - 1 x daily - 7 x weekly - 2-3 sets - 10 reps - 3 hold - Corner Pec Minor Stretch  - 1 x daily - 7 x weekly - 1 sets - 3 reps - 30 hold - Seated Upper Trapezius Stretch  - 1 x daily - 7 x weekly - 1 sets - 3 reps - 30 hold  ASSESSMENT:  CLINICAL IMPRESSION: Pt has experienced more episodes of R ant and lat GH area pain with returning from reaching overhead the past 2 days. Pt is TTP over the R long head of the bicep tendon. PT was completed for anterior chest flexibility and posterior chain strengthening and for loading of the biceps. An iontophoresis patch was applied over the ant GH area at the end of the session to address pain. Will assess pain status of the R shoulder the next PT session. Pt will continue to benefit from skilled PT to address impairments for improved function.    EVAL: Patient is a 57 y.o. female who was seen today for physical therapy evaluation and treatment for M25.511 (ICD-10-CM)  - Acute pain of right shoulder . On today's eval pt presented with min/mod limitations for R shoulder ROMs globally. ROM was and strength were limited by pain. 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 Pt will benefit from skilled PT 2w6 to address impairments to optimize function with less pain.   OBJECTIVE IMPAIRMENTS: decreased activity tolerance, decreased ROM, decreased strength, impaired UE functional use, postural dysfunction, obesity, and pain.   ACTIVITY LIMITATIONS: carrying, lifting, sleeping, and reach over head  PARTICIPATION LIMITATIONS: meal prep, cleaning, laundry, and hobby-knitting  PERSONAL FACTORS: Fitness, Past/current experiences, Time since onset of injury/illness/exacerbation, and 1-2 comorbidities: DM, high BMI  are also affecting patient's functional outcome.   REHAB POTENTIAL: Good  CLINICAL DECISION MAKING: Stable/uncomplicated  EVALUATION COMPLEXITY: Low   GOALS:  SHORT TERM GOALS: Target date: 06/25/23  Pt will be Ind in an initial HEP Baseline: Started Goal status: MET  2.  Pt will voice understanding of measures to assist in pain reduction  Baseline: For cold pack and cross friction massage Goal status: MET  LONG TERM GOALS: Target date: 07/23/23  Pt will be Ind in a final HEP to maintain achieved LOF  Baseline: started Goal status: INITIAL  2.  Increaser shoulder AROM for flexion to 135d, ER to T3, and IR L1 for improved functional use of the R shoulder/UE Baseline: see flow sheets Goal status: INITIAL  3.  Increase R shoulder and bicep strength to 4+ or greater for improved function of the R shoulder/UE Baseline:  Goal status: INITIAL  4.  Pt will report a decrease in R shoulder pain to 0-2/10 or less for improved R shoulder/UE function and QOL Baseline:0-4/10  Goal status: INITIAL  5.  Pt's FOTO score will improved to the predicted value of 57% as indication of improved function  Baseline: 46% Goal status:  INITIAL  PLAN:  PT FREQUENCY: 2x/week  PT DURATION: 6 weeks  PLANNED INTERVENTIONS: Therapeutic exercises, Therapeutic activity, Neuromuscular re-education, Balance training, Gait training, Patient/Family education, Self Care, Joint mobilization, Aquatic Therapy, Dry Needling, Electrical stimulation, Cryotherapy, Moist heat, Taping, Vasopneumatic device, Ultrasound, Ionotophoresis 4mg /ml Dexamethasone, Manual therapy, and Re-evaluation  PLAN FOR NEXT SESSION: Review FOTO; assess response to HEP; progress therex as indicated; use of modalities, manual therapy; and TPDN as indicated.  Jolanda Mccann MS, PT 06/25/23 4:49 PM

## 2023-06-25 ENCOUNTER — Other Ambulatory Visit: Payer: Self-pay | Admitting: Family Medicine

## 2023-06-25 ENCOUNTER — Ambulatory Visit: Payer: Medicaid Other

## 2023-06-25 DIAGNOSIS — M25511 Pain in right shoulder: Secondary | ICD-10-CM

## 2023-06-25 DIAGNOSIS — M6281 Muscle weakness (generalized): Secondary | ICD-10-CM

## 2023-06-29 ENCOUNTER — Encounter: Payer: Self-pay | Admitting: Sports Medicine

## 2023-06-29 ENCOUNTER — Encounter: Payer: Self-pay | Admitting: Family Medicine

## 2023-06-29 ENCOUNTER — Other Ambulatory Visit: Payer: Self-pay

## 2023-06-29 ENCOUNTER — Ambulatory Visit: Payer: Medicaid Other | Admitting: Sports Medicine

## 2023-06-29 DIAGNOSIS — M7501 Adhesive capsulitis of right shoulder: Secondary | ICD-10-CM

## 2023-06-29 DIAGNOSIS — G8929 Other chronic pain: Secondary | ICD-10-CM

## 2023-06-29 DIAGNOSIS — M25511 Pain in right shoulder: Secondary | ICD-10-CM

## 2023-06-29 MED ORDER — BUPIVACAINE HCL 0.25 % IJ SOLN
2.0000 mL | INTRAMUSCULAR | Status: AC | PRN
Start: 2023-06-29 — End: 2023-06-29
  Administered 2023-06-29: 2 mL via INTRA_ARTICULAR

## 2023-06-29 MED ORDER — METHYLPREDNISOLONE ACETATE 40 MG/ML IJ SUSP
40.0000 mg | INTRAMUSCULAR | Status: AC | PRN
Start: 2023-06-29 — End: 2023-06-29
  Administered 2023-06-29: 40 mg via INTRA_ARTICULAR

## 2023-06-29 MED ORDER — LIDOCAINE HCL 1 % IJ SOLN
2.0000 mL | INTRAMUSCULAR | Status: AC | PRN
Start: 2023-06-29 — End: 2023-06-29
  Administered 2023-06-29: 2 mL

## 2023-06-29 NOTE — Therapy (Signed)
OUTPATIENT PHYSICAL THERAPY SHOULDER TREATMENT NOTE   Patient Name: Jessica Ponce MRN: [id number] DOB:04/16/1966, 57 y.o., female Today's Date: 06/30/2023  END OF SESSION:   PT End of Session - 06/30/23 1104     Visit Number 4    Number of Visits 13    Authorization Type [place] MEDICA[employer] BLUE    Authorization Time Period Appoved 7 visits 06/02/23-07/31/23    PT Start Time 1103    PT Stop Time 1144    PT Time Calculation (min) 41 min    Activity Tolerance Patient tolerated treatment well    Behavior During Therapy WFL for tasks assessed/performed               Past Medical History:  Diagnosis Date   Allergy    Asthma    no current problems, does not use inhaler   Diabetes mellitus without complication (HCC)    type 2   Essential hypertension    Hyperlipidemia    Past Surgical History:  Procedure Laterality Date   APPENDECTOMY  1985   exploratory laparoscopy     WISDOM TOOTH EXTRACTION     Patient Active Problem List   Diagnosis Date Noted   Complicated migraine 03/21/2023   Acute ischemic stroke (HCC) 05/27/2022   Type 2 diabetes mellitus with hyperglycemia (HCC) 10/07/2021   SVT (supraventricular tachycardia) 10/07/2021   Family history of thyroid disorder 07/15/2020   Mixed hyperlipidemia 04/23/2020   Family history of breast cancer 03/11/2020   Type 2 diabetes mellitus (HCC) 03/11/2020   Hypertension, essential 07/31/2016   Family history of Alzheimer's disease 08/04/2009    PCP: Georganna Skeans, MD  REFERRING PROVIDER: Tarry Kos, MD  REFERRING DIAG: M25.511 (ICD-10-CM) - Acute pain of right shoulder   THERAPY DIAG:  Acute pain of right shoulder  Muscle weakness (generalized)  Rationale for Evaluation and Treatment: Rehabilitation  ONSET DATE: 6 months or greater  SUBJECTIVE:                                                                                                                                                                                       SUBJECTIVE STATEMENT: Pt reports getting another injection for the R shoulder yesterday. Pt notes she is not having pain.  EVAL: Pt reports the pain initiated with R biceps and progressed to her shoulder. An injection she received in the R shoulder has helped to reduce the pain and improve motion.  Hand dominance: Left  PERTINENT HISTORY: DM, high BMI  PAIN:  Are you having pain? Yes: NPRS scale: 0/10 Pain location: R shoulder, biceps Pain description: throb, Charley horse feeling Aggravating factors: Reaching above and  out to the side Relieving factors: Rest, limited use Pain range on eval: 0-4/10 past week 0-4/10 certain movements  PERTINENT HISTORY: DM, high BMI  PRECAUTIONS: None  WEIGHT BEARING RESTRICTIONS: No  FALLS:  Has patient fallen in last 6 months? No  LIVING ENVIRONMENT: Lives with: lives with their family Lives in: House/apartment No issue with accessing or mobility within home  OCCUPATION: Research scientist (life sciences); enjoys knitting  PLOF: Independent  PATIENT GOALS: Better ROM with less pain  NEXT MD VISIT:   OBJECTIVE:   DIAGNOSTIC FINDINGS Xray 05/21/23 X-rays show mild degenerative changes to the [facility] joint otherwise x-rays are  unremarkable.   PATIENT SURVEYS:  FOTO: Perceived function   46%, predicted   57%   COGNITION: Overall cognitive status: Within functional limits for tasks assessed     SENSATION: WFL  POSTURE: Forward head, rounded shoulders, increased thoracic kyphosis  UPPER EXTREMITY ROM:   Active ROM Right eval Left eval  Shoulder flexion A120, P120, pain at end range 140  Shoulder extension    Shoulder abduction    Shoulder adduction    Shoulder internal rotation A to PSIS, P60, pain at end range T  Shoulder external rotation A to T2, P60, pain at end range T4  Elbow flexion    Elbow extension    Wrist flexion    Wrist extension    Wrist ulnar deviation    Wrist radial deviation    Wrist pronation     Wrist supination    (Blank rows = not tested)  UPPER EXTREMITY MMT:  MMT Right eval Left eval  Shoulder flexion 4 pain   Shoulder extension    Shoulder abduction 4 pain   Shoulder adduction    Shoulder internal rotation 4 Pain   Shoulder external rotation 4 pain   Middle trapezius    Lower trapezius    Elbow flexion 4 pain   Elbow extension    Wrist flexion    Wrist extension    Wrist ulnar deviation    Wrist radial deviation    Wrist pronation    Wrist supination    Grip strength (lbs)    (Blank rows = not tested)  SHOULDER SPECIAL TESTS: Impingement tests: Hawkins/Kennedy impingement test: negative SLAP lesions: Crank test: negative Rotator cuff assessment: Empty can test: negative pain without weakness Biceps assessment: Yergason's test: positive   JOINT MOBILITY TESTING:  WNLs  PALPATION:  TTP to ant shoulder and along the prox bicipital tendon    TODAY'S TREATMENT:  [facility] Adult PT Treatment:                                                DATE: 06/30/23 Therapeutic Exercise: UBE 1.5 mins FWD/BWD each 30d pectoral stretch x3 30" Standing Shoulder Row 3x10 reps GTB Standing Single Arm Elbow Flexion 4# 3x10 reps Forearm Pronation and Supination 2# 3x10 reps Shoulder ER 2x8 YTB Manual Therapy: Distraction, inf, AP, PA grade 3 GH mobs  [facility] Adult PT Treatment:                                                DATE: 06/25/23 Therapeutic Exercise: 30d pectoral stretch x3 30" Standing Shoulder Row 2x10 reps RTB Standing Single Arm Elbow  Flexion 4# 2x10 reps Forearm Pronation and Supination 2# 2x10 reps Shoulder ER 2x8 YTB Upper trap/GH jt stretch x3 30" Modalities: Iontophoresis to the R anterior shoulder, dexamethosone, 4mg /ml, pt to wear 6 hours Pt advised re: skin irritation Self Care: Use of cold pack for 10 -15 mins for pain management   [facility] Adult PT Treatment:                                                DATE: 06/18/23 Therapeutic Exercise: 30d  pectoral stretch x3 30" Standing Shoulder Row 2x10 reps Standing Single Arm Elbow Flexion RTB 2x10 reps Forearm Pronation and Supination 2# 2x10 reps Shoulder ER 2x8 YTB Upper trap/GH jt stretch x3 30" Updated HEP Manual Therapy: Cross friction massage to the anterior [facility] jt/bicipital tendon Self Care: Instruction in cross friction massage- pt returned demonstration                                                                                                                               PATIENT EDUCATION: Education details: Eval findings, POC, HEP, self care Person educated: Patient Education method: Explanation, Demonstration, Tactile cues, Verbal cues, and Handouts Education comprehension: verbalized understanding, returned demonstration, verbal cues required, and tactile cues required  HOME EXERCISE PROGRAM: Access Code: [email or web address] URL: [email or web address] Date: 06/18/2023 Prepared by: Joellyn Rued  Exercises - Standing Shoulder Row with Anchored Resistance  - 1 x daily - 7 x weekly - 2-3 sets - 10 reps - 3 hold - Standing Single Arm Elbow Flexion with Resistance  - 1 x daily - 7 x weekly - 2-3 sets - 10 reps - 3 hold - Forearm Pronation and Supination with Hammer  - 1 x daily - 7 x weekly - 2-3 sets - 10 reps - 3 hold - Shoulder External Rotation and Scapular Retraction with Resistance  - 1 x daily - 7 x weekly - 2-3 sets - 10 reps - 3 hold - Corner Pec Minor Stretch  - 1 x daily - 7 x weekly - 1 sets - 3 reps - 30 hold - Seated Upper Trapezius Stretch  - 1 x daily - 7 x weekly - 1 sets - 3 reps - 30 hold  ASSESSMENT:  CLINICAL IMPRESSION: PT was completed for GH jt mobs, posterior chain strengthening and for loading of the biceps. Pain is improved following a shoulder injection. Pt tolerated PT today with min increase in R shoulder pain. Pt will continue to benefit from skilled PT to address impairments for improved function. Assess some of the LTGs the next PT  session.    EVAL: Patient is a 57 y.o. female who was seen today for physical therapy evaluation and treatment for M25.511 (ICD-10-CM) - Acute pain of right shoulder . On today's eval pt presented with min/mod limitations for R  shoulder ROMs globally. ROM was and strength were limited by pain. 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 Pt will benefit from skilled PT 2w6 to address impairments to optimize function with less pain.   OBJECTIVE IMPAIRMENTS: decreased activity tolerance, decreased ROM, decreased strength, impaired UE functional use, postural dysfunction, obesity, and pain.   ACTIVITY LIMITATIONS: carrying, lifting, sleeping, and reach over head  PARTICIPATION LIMITATIONS: meal prep, cleaning, laundry, and hobby-knitting  PERSONAL FACTORS: Fitness, Past/current experiences, Time since onset of injury/illness/exacerbation, and 1-2 comorbidities: DM, high BMI  are also affecting patient's functional outcome.   REHAB POTENTIAL: Good  CLINICAL DECISION MAKING: Stable/uncomplicated  EVALUATION COMPLEXITY: Low   GOALS:  SHORT TERM GOALS: Target date: 06/25/23  Pt will be Ind in an initial HEP Baseline: Started Goal status: MET  2.  Pt will voice understanding of measures to assist in pain reduction  Baseline: For cold pack and cross friction massage Goal status: MET  LONG TERM GOALS: Target date: 07/23/23  Pt will be Ind in a final HEP to maintain achieved LOF  Baseline: started Goal status: INITIAL  2.  Increaser shoulder AROM for flexion to 135d, ER to T3, and IR L1 for improved functional use of the R shoulder/UE Baseline: see flow sheets Goal status: INITIAL  3.  Increase R shoulder and bicep strength to 4+ or greater for improved function of the R shoulder/UE Baseline:  Goal status: INITIAL  4.  Pt will report a decrease in R shoulder pain to 0-2/10 or less for improved R shoulder/UE function and QOL Baseline:0-4/10  Goal status:  INITIAL  5.  Pt's FOTO score will improved to the predicted value of 57% as indication of improved function  Baseline: 46% Goal status: INITIAL  PLAN:  PT FREQUENCY: 2x/week  PT DURATION: 6 weeks  PLANNED INTERVENTIONS: Therapeutic exercises, Therapeutic activity, Neuromuscular re-education, Balance training, Gait training, Patient/Family education, Self Care, Joint mobilization, Aquatic Therapy, Dry Needling, Electrical stimulation, Cryotherapy, Moist heat, Taping, Vasopneumatic device, Ultrasound, Ionotophoresis 4mg /ml Dexamethasone, Manual therapy, and Re-evaluation  PLAN FOR NEXT SESSION: Review FOTO; assess response to HEP; progress therex as indicated; use of modalities, manual therapy; and TPDN as indicated.  Byron Peacock MS, PT 06/30/23 9:28 PM

## 2023-06-29 NOTE — Progress Notes (Addendum)
Jessica Ponce - 57 y.o. female MRN [id number]  Date of birth: 10-11-1966  Office Visit Note: Visit Date: 06/29/2023 PCP: Georganna Skeans, MD Referred by: Georganna Skeans, MD  Subjective: Chief Complaint  Patient presents with   Right Shoulder - Pain   HPI: Jessica Ponce is a pleasant 57 y.o. female who presents today for follow-up of adhesive capsulitis of the right shoulder.  Recently diagnosed by Dr. Roda Shutters, did have glenohumeral joint injection performed on 05/21/2023.  She states this gave her good relief.  She is doing formalized physical therapy twice weekly and doing her home exercises nearly daily.  She does admit she had a mixed flight when she was traveling and had to reschedule this appointment today.  Feels like she is about 70% improved at this point.  Still some stiffness and lack of range of motion but better from previous visit.  Lab Results  Component Value Date   HGBA1C 10.3 (A) 05/17/2023   Pertinent ROS were reviewed with the patient and found to be negative unless otherwise specified above in HPI.   Assessment & Plan: Visit Diagnoses:  1. Adhesive capsulitis of right shoulder   2. Chronic right shoulder pain    Plan: Discussed with Jessica Ponce the nature of her adhesive capsulitis, she did receive a positive response from the first intra-articular shoulder injection.  She is doing well progressing through physical therapy, she will continue formal PT and performing her home range of motion exercises daily.  We will follow-up in 2 weeks, consideration of 1 additional injection with large volume if she is not 90% better or more.  Discussed importance of maintaining good glucose control to help aid in recovery.  Follow-up in 2 weeks.  May use ice or over-the-counter anti-inflammatories for any postinjection pain.  Follow-up: Return in about 2 weeks (around 07/13/2023) for for frozen shoulder (30-min follow for poss inj).   Meds & Orders: No orders of the defined types were placed in  this encounter.   Orders Placed This Encounter  Procedures   US Guided Needle Placement - No Linked Charges     Procedures: Large Joint Inj: R glenohumeral on 06/29/2023 9:08 AM Indications: pain Details: 22 G 3.5 in needle, ultrasound-guided posterior approach Medications: 2 mL lidocaine 1 %; 2 mL bupivacaine 0.25 %; 40 mg methylPREDNISolone acetate 40 MG/ML Outcome: tolerated well, no immediate complications  US-guided glenohumeral joint injection, right shoulder After discussion on risks/benefits/indications, informed verbal consent was obtained. A timeout was then performed. The patient was positioned lying lateral recumbent on examination table. The patient's shoulder was prepped with betadine and multiple alcohol swabs and utilizing ultrasound guidance, the patient's glenohumeral joint was identified on ultrasound. Using ultrasound guidance a 22-gauge, 3.5 inch needle with a mixture of 2:2:1 cc's lidocaine:bupivicaine:depomedrol was directed from a lateral to medial direction via in-plane technique into the glenohumeral joint with visualization of appropriate spread of injectate into the joint. Patient tolerated the procedure well without immediate complications.       Procedure, treatment alternatives, risks and benefits explained, specific risks discussed. Consent was given by the patient. Immediately prior to procedure a time out was called to verify the correct patient, procedure, equipment, support staff and site/side marked as required. Patient was prepped and draped in the usual sterile fashion.          Clinical History: No specialty comments available.  She reports that she has never smoked. She has never been exposed to tobacco smoke. She has never used smokeless tobacco.  Recent Labs    05/17/23 0813  HGBA1C 10.3*    Objective:   Vital Signs: LMP 12/10/2015 (LMP Unknown)   Physical Exam  Gen: Well-appearing, in no acute distress; non-toxic CV: Regular Rate.  Well-perfused. Warm.  Resp: Breathing unlabored on room air; no wheezing. Psych: Fluid speech in conversation; appropriate affect; normal thought process Neuro: Sensation intact throughout. No gross coordination deficits.   Ortho Exam - Right shoulder: No bony TTP.  There is restriction in active and passive range of motion of the right shoulder.  Forward flexion to 170 degrees, abduction 135 degrees, external rotation 30 degrees compared to 60 degrees of the contralateral arm.  Internal rotation with thumb to L5 compared to T10 of the contralateral arm.  Imaging:  XR Shoulder Right X-rays show mild degenerative changes to the [facility] joint otherwise x-rays are  unremarkable.  Past Medical/Family/Surgical/Social History: Medications & Allergies reviewed per EMR, new medications updated. Patient Active Problem List   Diagnosis Date Noted   Complicated migraine 03/21/2023   Acute ischemic stroke (HCC) 05/27/2022   Type 2 diabetes mellitus with hyperglycemia (HCC) 10/07/2021   SVT (supraventricular tachycardia) 10/07/2021   Family history of thyroid disorder 07/15/2020   Mixed hyperlipidemia 04/23/2020   Family history of breast cancer 03/11/2020   Type 2 diabetes mellitus (HCC) 03/11/2020   Hypertension, essential 07/31/2016   Family history of Alzheimer's disease 08/04/2009   Past Medical History:  Diagnosis Date   Allergy    Asthma    no current problems, does not use inhaler   Diabetes mellitus without complication (HCC)    type 2   Essential hypertension    Hyperlipidemia    Family History  Problem Relation Age of Onset   Memory loss Mother    Breast cancer Mother 48   Stroke Father    Breast cancer Maternal Aunt 60   Colon cancer Neg Hx    Rectal cancer Neg Hx    Stomach cancer Neg Hx    Esophageal cancer Neg Hx    Past Surgical History:  Procedure Laterality Date   APPENDECTOMY  1985   exploratory laparoscopy     WISDOM TOOTH EXTRACTION     Social History    Occupational History   Not on file  Tobacco Use   Smoking status: Never    Passive exposure: Never   Smokeless tobacco: Never  Vaping Use   Vaping status: Never Used  Substance and Sexual Activity   Alcohol use: Yes    Alcohol/week: 0.0 standard drinks of alcohol    Comment: occasional wine   Drug use: No   Sexual activity: Yes    Birth control/protection: Post-menopausal

## 2023-06-29 NOTE — Telephone Encounter (Signed)
Patient request referral for skin check

## 2023-06-30 ENCOUNTER — Ambulatory Visit: Payer: Medicaid Other

## 2023-06-30 DIAGNOSIS — M25511 Pain in right shoulder: Secondary | ICD-10-CM

## 2023-06-30 DIAGNOSIS — M6281 Muscle weakness (generalized): Secondary | ICD-10-CM

## 2023-07-02 ENCOUNTER — Ambulatory Visit: Payer: Medicaid Other | Attending: Orthopaedic Surgery

## 2023-07-02 DIAGNOSIS — M6281 Muscle weakness (generalized): Secondary | ICD-10-CM | POA: Diagnosis present

## 2023-07-02 DIAGNOSIS — M25511 Pain in right shoulder: Secondary | ICD-10-CM | POA: Diagnosis present

## 2023-07-02 NOTE — Therapy (Signed)
OUTPATIENT PHYSICAL THERAPY SHOULDER TREATMENT NOTE   Patient Name: Jessica Ponce MRN: [id number] DOB:[birth year], 57 y.o., female Today's Date: 07/02/2023  END OF SESSION:   PT End of Session - 07/02/23 1242     Visit Number 5    Number of Visits 13    Date for PT Re-Evaluation 07/23/23    Authorization Type [place] MEDICAID HEALTHY BLUE    Authorization Time Period Appoved 7 visits 06/02/23-07/31/23    PT Start Time 1107    PT Stop Time 1152    PT Time Calculation (min) 45 min    Activity Tolerance Patient tolerated treatment well    Behavior During Therapy WFL for tasks assessed/performed                Past Medical History:  Diagnosis Date   Allergy    Asthma    no current problems, does not use inhaler   Diabetes mellitus without complication (HCC)    type 2   Essential hypertension    Hyperlipidemia    Past Surgical History:  Procedure Laterality Date   APPENDECTOMY  1985   exploratory laparoscopy     WISDOM TOOTH EXTRACTION     Patient Active Problem List   Diagnosis Date Noted   Complicated migraine 03/21/2023   Acute ischemic stroke (HCC) 05/27/2022   Type 2 diabetes mellitus with hyperglycemia (HCC) 10/07/2021   SVT (supraventricular tachycardia) 10/07/2021   Family history of thyroid disorder 07/15/2020   Mixed hyperlipidemia 04/23/2020   Family history of breast cancer 03/11/2020   Type 2 diabetes mellitus (HCC) 03/11/2020   Hypertension, essential 07/31/2016   Family history of Alzheimer's disease 08/04/2009    PCP: Georganna Skeans, MD  REFERRING PROVIDER: Tarry Kos, MD  REFERRING DIAG: M25.511 (ICD-10-CM) - Acute pain of right shoulder   THERAPY DIAG:  Acute pain of right shoulder  Muscle weakness (generalized)  Rationale for Evaluation and Treatment: Rehabilitation  ONSET DATE: 6 months or greater  SUBJECTIVE:                                                                                                                                                                                       SUBJECTIVE STATEMENT: Pt reports her R shoulder was fatigued and sore after the last PT session, but recovered by the next day.  EVAL: Pt reports the pain initiated with R biceps and progressed to her shoulder. An injection she received in the R shoulder has helped to reduce the pain and improve motion.  Hand dominance: Left  PERTINENT HISTORY: DM, high BMI  PAIN:  Are you having pain? Yes: NPRS scale: 0/10 Pain location: R shoulder,  biceps Pain description: throb, Charley horse feeling Aggravating factors: Reaching above and out to the side Relieving factors: Rest, limited use Pain range on eval: 0-4/10 past week 0-4/10 certain movements  PERTINENT HISTORY: DM, high BMI  PRECAUTIONS: None  WEIGHT BEARING RESTRICTIONS: No  FALLS:  Has patient fallen in last 6 months? No  LIVING ENVIRONMENT: Lives with: lives with their family Lives in: House/apartment No issue with accessing or mobility within home  OCCUPATION: Research scientist (life sciences); enjoys knitting  PLOF: Independent  PATIENT GOALS: Better ROM with less pain  NEXT MD VISIT:   OBJECTIVE:   DIAGNOSTIC FINDINGS Xray 05/21/23 X-rays show mild degenerative changes to the [facility] joint otherwise x-rays are  unremarkable.   PATIENT SURVEYS:  FOTO: Perceived function   46%, predicted   57%   COGNITION: Overall cognitive status: Within functional limits for tasks assessed     SENSATION: WFL  POSTURE: Forward head, rounded shoulders, increased thoracic kyphosis  UPPER EXTREMITY ROM:   Active ROM Right eval Left eval  Shoulder flexion A120, P120, pain at end range 140  Shoulder extension    Shoulder abduction    Shoulder adduction    Shoulder internal rotation A to PSIS, P60, pain at end range T  Shoulder external rotation A to T2, P60, pain at end range T4  Elbow flexion    Elbow extension    Wrist flexion    Wrist extension    Wrist ulnar deviation     Wrist radial deviation    Wrist pronation    Wrist supination    (Blank rows = not tested)  UPPER EXTREMITY MMT:  MMT Right eval Left eval RT 07/02/23  Shoulder flexion 4 pain  5 no p  Shoulder extension     Shoulder abduction 4 pain  4+ c pain  Shoulder adduction     Shoulder internal rotation 4 Pain    Shoulder external rotation 4 pain  5 no p  Middle trapezius     Lower trapezius     Elbow flexion 4 pain    Elbow extension     Wrist flexion     Wrist extension     Wrist ulnar deviation     Wrist radial deviation     Wrist pronation     Wrist supination     Grip strength (lbs)     (Blank rows = not tested)  SHOULDER SPECIAL TESTS: Impingement tests: Hawkins/Kennedy impingement test: negative SLAP lesions: Crank test: negative Rotator cuff assessment: Empty can test: negative pain without weakness Biceps assessment: Yergason's test: positive   JOINT MOBILITY TESTING:  WNLs  PALPATION:  TTP to ant shoulder and along the prox bicipital tendon    TODAY'S TREATMENT:  [facility] Adult PT Treatment:                                                DATE: 07/02/23  Therapeutic Exercise: UBE 1.5 mins FWD/BWD each Pulley flexion 1 min 30d pectoral stretch x3 30" Standing Shoulder Row 3x10 reps GTB Standing Single Arm Elbow Flexion 5# 2x10 reps Forearm Pronation and Supination 3# 2x10 reps Shoulder ER 2x10 YTB Manual Therapy: STM to the R GH area, upper shoulder, and posterior scapula muscles  [facility] Adult PT Treatment:  DATE: 06/30/23 Therapeutic Exercise: UBE 1.5 mins FWD/BWD each 30d pectoral stretch x3 30" Standing Shoulder Row 3x10 reps GTB Standing Single Arm Elbow Flexion 4# 3x10 reps Forearm Pronation and Supination 2# 3x10 reps Shoulder ER 2x8 YTB Manual Therapy: Distraction, inf, AP, PA grade 3 GH mobs  [facility] Adult PT Treatment:                                                DATE: 06/25/23 Therapeutic Exercise: 30d  pectoral stretch x3 30" Standing Shoulder Row 2x10 reps RTB Standing Single Arm Elbow Flexion 4# 2x10 reps Forearm Pronation and Supination 2# 2x10 reps Shoulder ER 2x8 YTB Upper trap/GH jt stretch x3 30" Modalities: Iontophoresis to the R anterior shoulder, dexamethosone, 4mg /ml, pt to wear 6 hours Pt advised re: skin irritation Self Care: Use of cold pack for 10 -15 mins for pain management   [facility] Adult PT Treatment:                                                DATE: 06/18/23 Therapeutic Exercise: 30d pectoral stretch x3 30" Standing Shoulder Row 2x10 reps Standing Single Arm Elbow Flexion RTB 2x10 reps Forearm Pronation and Supination 2# 2x10 reps Shoulder ER 2x8 YTB Upper trap/GH jt stretch x3 30" Updated HEP Manual Therapy: Cross friction massage to the anterior [facility] jt/bicipital tendon Self Care: Instruction in cross friction massage- pt returned demonstration                                                                                                                               PATIENT EDUCATION: Education details: Eval findings, POC, HEP, self care Person educated: Patient Education method: Explanation, Demonstration, Tactile cues, Verbal cues, and Handouts Education comprehension: verbalized understanding, returned demonstration, verbal cues required, and tactile cues required  HOME EXERCISE PROGRAM: Access Code: [email or web address] URL: [email or web address] Date: 06/18/2023 Prepared by: Joellyn Rued  Exercises - Standing Shoulder Row with Anchored Resistance  - 1 x daily - 7 x weekly - 2-3 sets - 10 reps - 3 hold - Standing Single Arm Elbow Flexion with Resistance  - 1 x daily - 7 x weekly - 2-3 sets - 10 reps - 3 hold - Forearm Pronation and Supination with Hammer  - 1 x daily - 7 x weekly - 2-3 sets - 10 reps - 3 hold - Shoulder External Rotation and Scapular Retraction with Resistance  - 1 x daily - 7 x weekly - 2-3 sets - 10 reps - 3 hold - Corner Pec  Minor Stretch  - 1 x daily - 7 x weekly - 1 sets - 3 reps - 30 hold - Seated Upper Trapezius Stretch  -  1 x daily - 7 x weekly - 1 sets - 3 reps - 30 hold  ASSESSMENT:  CLINICAL IMPRESSION: PT was completed for STM to the R GH area, upper shoulder, and post scapula muscle. Increased muscle tightness and tenderness was palpated for the infraspinatus, upper trap, and the ant GH area. Pt then completed ROM and strengthening therex for the R shoulder. Strengthening therex were for the [facility] and periscapular muscles and for bicep loading. Discussed TPDN as a treatment option for the tigh and tender muscles. Pt has received TPDN in the past and is open to it. Resisted R shoulder flexion and ER are not painful, and demonstrate 5/5 strength. Resisted abduction continues to be painful with weakness due to pain. Overall, pain, strength and function are better. Pt tolerated PT today without adverse effects. Pt will continue to benefit from skilled PT to address impairments for improved R shoulder function with less pain.      EVAL: Patient is a 57 y.o. female who was seen today for physical therapy evaluation and treatment for M25.511 (ICD-10-CM) - Acute pain of right shoulder . On today's eval pt presented with min/mod limitations for R shoulder ROMs globally. ROM was and strength were limited by pain. 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 Pt will benefit from skilled PT 2w6 to address impairments to optimize function with less pain.   OBJECTIVE IMPAIRMENTS: decreased activity tolerance, decreased ROM, decreased strength, impaired UE functional use, postural dysfunction, obesity, and pain.   ACTIVITY LIMITATIONS: carrying, lifting, sleeping, and reach over head  PARTICIPATION LIMITATIONS: meal prep, cleaning, laundry, and hobby-knitting  PERSONAL FACTORS: Fitness, Past/current experiences, Time since onset of injury/illness/exacerbation, and 1-2 comorbidities: DM, high BMI  are also  affecting patient's functional outcome.   REHAB POTENTIAL: Good  CLINICAL DECISION MAKING: Stable/uncomplicated  EVALUATION COMPLEXITY: Low   GOALS:  SHORT TERM GOALS: Target date: 06/25/23  Pt will be Ind in an initial HEP Baseline: Started Goal status: MET  2.  Pt will voice understanding of measures to assist in pain reduction  Baseline: For cold pack and cross friction massage Goal status: MET  LONG TERM GOALS: Target date: 07/23/23  Pt will be Ind in a final HEP to maintain achieved LOF  Baseline: started Goal status: INITIAL  2.  Increase R shoulder AROM for flexion to 135d, ER to T3, and IR L1 for improved functional use of the R shoulder/UE Baseline: see flow sheets Goal status: INITIAL  3.  Increase R shoulder and bicep strength to 4+ or greater for improved function of the R shoulder/UE Baseline:  07/02/23: See flow sheets Goal status: Improving  4.  Pt will report a decrease in R shoulder pain to 0-2/10 or less for improved R shoulder/UE function and QOL Baseline:0-4/10  Goal status: Ongoing  5.  Pt's FOTO score will improved to the predicted value of 57% as indication of improved function  Baseline: 46% Goal status: INITIAL  PLAN:  PT FREQUENCY: 2x/week  PT DURATION: 6 weeks  PLANNED INTERVENTIONS: Therapeutic exercises, Therapeutic activity, Neuromuscular re-education, Balance training, Gait training, Patient/Family education, Self Care, Joint mobilization, Aquatic Therapy, Dry Needling, Electrical stimulation, Cryotherapy, Moist heat, Taping, Vasopneumatic device, Ultrasound, Ionotophoresis 4mg /ml Dexamethasone, Manual therapy, and Re-evaluation  PLAN FOR NEXT SESSION: Review FOTO; assess response to HEP; progress therex as indicated; use of modalities, manual therapy; and TPDN as indicated.  Edelmiro Innocent MS, PT 07/02/23 1:07 PM

## 2023-07-07 ENCOUNTER — Ambulatory Visit: Payer: Medicaid Other

## 2023-07-07 DIAGNOSIS — M6281 Muscle weakness (generalized): Secondary | ICD-10-CM

## 2023-07-07 DIAGNOSIS — M25511 Pain in right shoulder: Secondary | ICD-10-CM

## 2023-07-07 NOTE — Therapy (Signed)
OUTPATIENT PHYSICAL THERAPY SHOULDER TREATMENT NOTE   Patient Name: Jessica Ponce MRN: [id number] DOB:08/11/1966, 57 y.o., female Today's Date: 07/07/2023  END OF SESSION:   PT End of Session - 07/07/23 1506     Visit Number 6    Number of Visits 13    Date for PT Re-Evaluation 07/23/23    Authorization Type [place] MEDICAID HEALTHY BLUE    Authorization Time Period Appoved 7 visits 06/02/23-07/31/23    PT Start Time 1501    PT Stop Time 1544    PT Time Calculation (min) 43 min    Activity Tolerance Patient tolerated treatment well    Behavior During Therapy WFL for tasks assessed/performed                 Past Medical History:  Diagnosis Date   Allergy    Asthma    no current problems, does not use inhaler   Diabetes mellitus without complication (HCC)    type 2   Essential hypertension    Hyperlipidemia    Past Surgical History:  Procedure Laterality Date   APPENDECTOMY  1985   exploratory laparoscopy     WISDOM TOOTH EXTRACTION     Patient Active Problem List   Diagnosis Date Noted   Complicated migraine 03/21/2023   Acute ischemic stroke (HCC) 05/27/2022   Type 2 diabetes mellitus with hyperglycemia (HCC) 10/07/2021   SVT (supraventricular tachycardia) 10/07/2021   Family history of thyroid disorder 07/15/2020   Mixed hyperlipidemia 04/23/2020   Family history of breast cancer 03/11/2020   Type 2 diabetes mellitus (HCC) 03/11/2020   Hypertension, essential 07/31/2016   Family history of Alzheimer's disease 08/04/2009    PCP: Georganna Skeans, MD  REFERRING PROVIDER: Tarry Kos, MD  REFERRING DIAG: M25.511 (ICD-10-CM) - Acute pain of right shoulder   THERAPY DIAG:  Acute pain of right shoulder  Muscle weakness (generalized)  Rationale for Evaluation and Treatment: Rehabilitation  ONSET DATE: 6 months or greater  SUBJECTIVE:                                                                                                                                                                                       SUBJECTIVE STATEMENT: Pt reports her R shoulder has been doing well. She has not been experiencing pain, but did have some muscles spasms of the shoulder last night.  EVAL: Pt reports the pain initiated with R biceps and progressed to her shoulder. An injection she received in the R shoulder has helped to reduce the pain and improve motion.  Hand dominance: Left  PERTINENT HISTORY: DM, high BMI  PAIN:  Are you having pain? Yes:  NPRS scale: 0/10 Pain location: R shoulder, biceps Pain description: throb, Charley horse feeling Aggravating factors: Reaching above and out to the side Relieving factors: Rest, limited use Pain range on eval: 0-4/10 past week 0-4/10 certain movements  PERTINENT HISTORY: DM, high BMI  PRECAUTIONS: None  WEIGHT BEARING RESTRICTIONS: No  FALLS:  Has patient fallen in last 6 months? No  LIVING ENVIRONMENT: Lives with: lives with their family Lives in: House/apartment No issue with accessing or mobility within home  OCCUPATION: Research scientist (life sciences); enjoys knitting  PLOF: Independent  PATIENT GOALS: Better ROM with less pain  NEXT MD VISIT:   OBJECTIVE:   DIAGNOSTIC FINDINGS Xray 05/21/23 X-rays show mild degenerative changes to the [facility] joint otherwise x-rays are  unremarkable.   PATIENT SURVEYS:  FOTO: Perceived function   46%, predicted   57%   COGNITION: Overall cognitive status: Within functional limits for tasks assessed     SENSATION: WFL  POSTURE: Forward head, rounded shoulders, increased thoracic kyphosis  UPPER EXTREMITY ROM:   Active ROM Right eval Left eval Rt 07/07/23  Shoulder flexion A120, P120, pain at end range 140 A135 p at end range  Shoulder extension     Shoulder abduction     Shoulder adduction     Shoulder internal rotation A to PSIS, P60, pain at end range T A to PSIS, P60, pain at end range  Shoulder external rotation A to T2, P60, pain at end  range T4 A T3, P 75  Elbow flexion     Elbow extension     Wrist flexion     Wrist extension     Wrist ulnar deviation     Wrist radial deviation     Wrist pronation     Wrist supination     (Blank rows = not tested)  UPPER EXTREMITY MMT:  MMT Right eval Left eval RT 07/02/23 RT 07/07/23  Shoulder flexion 4 pain  5 no p   Shoulder extension      Shoulder abduction 4 pain  4+ c pain   Shoulder adduction      Shoulder internal rotation 4 Pain     Shoulder external rotation 4 pain  5 no p   Middle trapezius      Lower trapezius      Elbow flexion 4 pain   5 no p  Elbow extension      Wrist flexion      Wrist extension      Wrist ulnar deviation      Wrist radial deviation      Wrist pronation      Wrist supination      Grip strength (lbs)      (Blank rows = not tested)  SHOULDER SPECIAL TESTS: Impingement tests: Hawkins/Kennedy impingement test: negative SLAP lesions: Crank test: negative Rotator cuff assessment: Empty can test: negative pain without weakness Biceps assessment: Yergason's test: positive   JOINT MOBILITY TESTING:  WNLs  PALPATION:  TTP to ant shoulder and along the prox bicipital tendon    TODAY'S TREATMENT:  [facility] Adult PT Treatment:                                                DATE: 07/07/23  Therapeutic Exercise: UBE 1.5 mins FWD/BWD each 30d pectoral stretch x3 30" Sleeper stretch 3x30" Standing Shoulder Row 3x10 reps GTB Standing  Single Arm Elbow Flexion 5# 3x10 reps Forearm Pronation and Supination 3# 3x10 reps Shoulder ER 3x10 YTB ROMs Therapeutic Activity: FOTO reassessment and review  [facility] Adult PT Treatment:                                                DATE: 07/02/23  Therapeutic Exercise: UBE 1.5 mins FWD/BWD each Pulley flexion 1 min 30d pectoral stretch x3 30" Standing Shoulder Row 3x10 reps GTB Standing Single Arm Elbow Flexion 5# 2x10 reps Forearm Pronation and Supination 3# 2x10 reps Shoulder ER 2x10 YTB Manual  Therapy: STM to the R GH area, upper shoulder, and posterior scapula muscles  [facility] Adult PT Treatment:                                                DATE: 06/30/23 Therapeutic Exercise: UBE 1.5 mins FWD/BWD each 30d pectoral stretch x3 30" Standing Shoulder Row 3x10 reps GTB Standing Single Arm Elbow Flexion 4# 3x10 reps Forearm Pronation and Supination 2# 3x10 reps Shoulder ER 2x8 YTB Manual Therapy: Distraction, inf, AP, PA grade 3 GH mobs  [facility] Adult PT Treatment:                                                DATE: 06/25/23 Therapeutic Exercise: 30d pectoral stretch x3 30" Standing Shoulder Row 2x10 reps RTB Standing Single Arm Elbow Flexion 4# 2x10 reps Forearm Pronation and Supination 2# 2x10 reps Shoulder ER 2x8 YTB Upper trap/GH jt stretch x3 30" Modalities: Iontophoresis to the R anterior shoulder, dexamethosone, 4mg /ml, pt to wear 6 hours Pt advised re: skin irritation Self Care: Use of cold pack for 10 -15 mins for pain management                                                                                                                               PATIENT EDUCATION: Education details: Eval findings, POC, HEP, self care Person educated: Patient Education method: Explanation, Demonstration, Tactile cues, Verbal cues, and Handouts Education comprehension: verbalized understanding, returned demonstration, verbal cues required, and tactile cues required  HOME EXERCISE PROGRAM: Access Code: [email or web address] URL: [email or web address] Date: 07/07/2023 Prepared by: Joellyn Rued  Exercises - Standing Shoulder Row with Anchored Resistance  - 1 x daily - 7 x weekly - 2-3 sets - 10 reps - 3 hold - Standing Single Arm Elbow Flexion with Resistance  - 1 x daily - 7 x weekly - 2-3 sets - 10 reps - 3 hold - Forearm Pronation and Supination with Hammer  -  1 x daily - 7 x weekly - 2-3 sets - 10 reps - 3 hold - Shoulder External Rotation and Scapular Retraction with  Resistance  - 1 x daily - 7 x weekly - 2-3 sets - 10 reps - 3 hold - Corner Pec Minor Stretch  - 1 x daily - 7 x weekly - 1 sets - 3 reps - 30 hold - Seated Upper Trapezius Stretch  - 1 x daily - 7 x weekly - 1 sets - 3 reps - 30 hold - Sleeper Stretch (Mirrored)  - 2 x daily - 7 x weekly - 1 sets - 3-5 reps - 30 hold ASSESSMENT:  CLINICAL IMPRESSION: Reassessed R shoulder active and passive ROM with flexion and ER both improving, while IR continues to be decreased. Pt has discomfort at the end range of each motion. A sleeper stretch was completed and added to the pt's HEP to address shoulder IR ROM. Since the last PT session, the pt's R shoulder pain has continued to be improved with pt rating at 0/10. R bicep strength tested at 5/5 without pain. Overall, the pt has made good improvement. Pt is to return to PT next week, at that time will assess for continuation of PT vs DC.    EVAL: Patient is a 57 y.o. female who was seen today for physical therapy evaluation and treatment for M25.511 (ICD-10-CM) - Acute pain of right shoulder . On today's eval pt presented with min/mod limitations for R shoulder ROMs globally. ROM was and strength were limited by pain. 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 Pt will benefit from skilled PT 2w6 to address impairments to optimize function with less pain.   OBJECTIVE IMPAIRMENTS: decreased activity tolerance, decreased ROM, decreased strength, impaired UE functional use, postural dysfunction, obesity, and pain.   ACTIVITY LIMITATIONS: carrying, lifting, sleeping, and reach over head  PARTICIPATION LIMITATIONS: meal prep, cleaning, laundry, and hobby-knitting  PERSONAL FACTORS: Fitness, Past/current experiences, Time since onset of injury/illness/exacerbation, and 1-2 comorbidities: DM, high BMI  are also affecting patient's functional outcome.   REHAB POTENTIAL: Good  CLINICAL DECISION MAKING: Stable/uncomplicated  EVALUATION COMPLEXITY:  Low   GOALS:  SHORT TERM GOALS: Target date: 06/25/23  Pt will be Ind in an initial HEP Baseline: Started Goal status: MET  2.  Pt will voice understanding of measures to assist in pain reduction  Baseline: For cold pack and cross friction massage Goal status: MET  LONG TERM GOALS: Target date: 07/23/23  Pt will be Ind in a final HEP to maintain achieved LOF  Baseline: started Goal status: INITIAL  2.  Increase R shoulder AROM for flexion to 135d, ER to T3, and IR L1 for improved functional use of the R shoulder/UE Baseline: see flow sheets Goal status: INITIAL  3.  Increase R shoulder and bicep strength to 4+ or greater for improved function of the R shoulder/UE Baseline:  07/02/23: See flow sheets Goal status: Improving  4.  Pt will report a decrease in R shoulder pain to 0-2/10 or less for improved R shoulder/UE function and QOL Baseline:0-4/10  Goal status: Ongoing  5.  Pt's FOTO score will improved to the predicted value of 57% as indication of improved function  Baseline: 46% Goal status: INITIAL  PLAN:  PT FREQUENCY: 2x/week  PT DURATION: 6 weeks  PLANNED INTERVENTIONS: Therapeutic exercises, Therapeutic activity, Neuromuscular re-education, Balance training, Gait training, Patient/Family education, Self Care, Joint mobilization, Aquatic Therapy, Dry Needling, Electrical stimulation, Cryotherapy, Moist heat, Taping, Vasopneumatic  device, Ultrasound, Ionotophoresis 4mg /ml Dexamethasone, Manual therapy, and Re-evaluation  PLAN FOR NEXT SESSION: Review FOTO; assess response to HEP; progress therex as indicated; use of modalities, manual therapy; and TPDN as indicated.  Karol Skarzynski MS, PT 07/07/23 5:24 PM

## 2023-07-09 ENCOUNTER — Ambulatory Visit: Payer: Medicaid Other | Admitting: Physical Therapy

## 2023-07-13 ENCOUNTER — Other Ambulatory Visit: Payer: Self-pay | Admitting: Family Medicine

## 2023-07-13 ENCOUNTER — Encounter: Payer: Self-pay | Admitting: Sports Medicine

## 2023-07-13 ENCOUNTER — Ambulatory Visit: Payer: Medicaid Other | Admitting: Sports Medicine

## 2023-07-13 DIAGNOSIS — M25511 Pain in right shoulder: Secondary | ICD-10-CM

## 2023-07-13 DIAGNOSIS — G8929 Other chronic pain: Secondary | ICD-10-CM

## 2023-07-13 DIAGNOSIS — M7501 Adhesive capsulitis of right shoulder: Secondary | ICD-10-CM | POA: Diagnosis not present

## 2023-07-13 DIAGNOSIS — Z794 Long term (current) use of insulin: Secondary | ICD-10-CM

## 2023-07-13 DIAGNOSIS — E1165 Type 2 diabetes mellitus with hyperglycemia: Secondary | ICD-10-CM | POA: Diagnosis not present

## 2023-07-13 NOTE — Progress Notes (Signed)
Jessica Ponce - 57 y.o. female MRN [id number]  Date of birth: 12-10-1965  Office Visit Note: Visit Date: 07/13/2023 PCP: Jessica Skeans, MD Referred by: Jessica Skeans, MD  Subjective: Chief Complaint  Patient presents with   Right Shoulder - Follow-up   HPI: Jessica Ponce is a pleasant 57 y.o. female who presents today for f/u of right frozen shoulder.  Had US-guided GHJ injection on 05/21/23, repeat on 06/28/33 which was regular volume.  She found good relief in pain as well as improve range of motion following this.  At this point she feels like she is at least 85% improved.  Only has some mild restriction with internal rotation and some pain with certain reaching motions but in general much improved function.  No longer has pain sleeping at night.  She is working on controlling her blood glucose, but working with PCP to get her sensor and Occupational hygienist. Also working to implement dietary changes.  She has been undergoing formalized physical therapy, has 1 additional session.  She is doing home exercises every other day as well.  Lab Results  Component Value Date   HGBA1C 10.3 (A) 05/17/2023   Pertinent ROS were reviewed with the patient and found to be negative unless otherwise specified above in HPI.   Assessment & Plan: Visit Diagnoses:  1. Adhesive capsulitis of right shoulder   2. Chronic right shoulder pain   3. Type 2 diabetes mellitus with hyperglycemia, with long-term current use of insulin (HCC)    Plan: Discussed with Jessica Ponce the plan for her improving adhesive capsulitis as well as her uncontrolled type 2 diabetes.  She did get excellent relief in pain and improvement in range of motion from 2 subsequent glenohumeral joint injections.  Given the improvement anterior elevated glucose levels, we will hold on further injection at this point.  She will complete her formalized physical therapy and I would like her to continue working hard on her home rehab exercises to further  improve pain and stabilize the shoulder.  We discussed the importance of improving glucose control, she is working on receiving her Occupational hygienist and sensor.  She will continue her insulin Humalog 75/25 with 20u BID with meals, as well as sliding scale/correctional insulin based on her glucose readings. Ok to continue Gabapentin 300mg  BID-TID as needed as well for pain or diabetic neuropathy.  She will follow-up in 1 month with Dr. Roda Ponce for further evaluation of her adhesive capsulitis.  If for some reason her progress plateaus or she needs 1 subsequent injection, I am happy to see her back.  Otherwise follow-up with Dr. Roda Ponce.   Recommended following up with her primary care doctor in that office to help with obtaining home Dexcom sensor and transmitter to help aid in her diabetic control.  Follow-up: Return for f/u in 1 month with Dr. Roda Ponce for frozen shoulder.   Meds & Orders: No orders of the defined types were placed in this encounter.  No orders of the defined types were placed in this encounter.    Procedures: No procedures performed      Clinical History: No specialty comments available.  She reports that she has never smoked. She has never been exposed to tobacco smoke. She has never used smokeless tobacco.  Recent Labs    05/17/23 0813  HGBA1C 10.3*    Objective:   Vital Signs: LMP 12/10/2015 (LMP Unknown)   Physical Exam  Gen: Well-appearing, in no acute distress; non-toxic CV: Regular Rate. Well-perfused. Warm.  Resp: Breathing unlabored on room air; no wheezing. Psych: Fluid speech in conversation; appropriate affect; normal thought process Neuro: Sensation intact throughout. No gross coordination deficits.   Ortho Exam - Right shoulder: No bony TTP, no redness or swelling.  There is full range of motion both active and passively with forward flexion as well as abduction.  External rotation is improved to about 50 degrees compared to 60 degrees of the contralateral arm.   Internal rotation with thumb to L3 compared to T10 on the contralateral arm.  All planes of motion are improved from previous visit.  Imaging:  Previous imaging: XR Shoulder Right X-rays show mild degenerative changes to the [facility] joint otherwise x-rays are  unremarkable.  Past Medical/Family/Surgical/Social History: Medications & Allergies reviewed per EMR, new medications updated. Patient Active Problem List   Diagnosis Date Noted   Complicated migraine 03/21/2023   Acute ischemic stroke (HCC) 05/27/2022   Type 2 diabetes mellitus with hyperglycemia (HCC) 10/07/2021   SVT (supraventricular tachycardia) 10/07/2021   Family history of thyroid disorder 07/15/2020   Mixed hyperlipidemia 04/23/2020   Family history of breast cancer 03/11/2020   Type 2 diabetes mellitus (HCC) 03/11/2020   Hypertension, essential 07/31/2016   Family history of Alzheimer's disease 08/04/2009   Past Medical History:  Diagnosis Date   Allergy    Asthma    no current problems, does not use inhaler   Diabetes mellitus without complication (HCC)    type 2   Essential hypertension    Hyperlipidemia    Family History  Problem Relation Age of Onset   Memory loss Mother    Breast cancer Mother 90   Stroke Father    Breast cancer Maternal Aunt 7   Colon cancer Neg Hx    Rectal cancer Neg Hx    Stomach cancer Neg Hx    Esophageal cancer Neg Hx    Past Surgical History:  Procedure Laterality Date   APPENDECTOMY  1985   exploratory laparoscopy     WISDOM TOOTH EXTRACTION     Social History   Occupational History   Not on file  Tobacco Use   Smoking status: Never    Passive exposure: Never   Smokeless tobacco: Never  Vaping Use   Vaping status: Never Used  Substance and Sexual Activity   Alcohol use: Yes    Alcohol/week: 0.0 standard drinks of alcohol    Comment: occasional wine   Drug use: No   Sexual activity: Yes    Birth control/protection: Post-menopausal

## 2023-07-13 NOTE — Therapy (Signed)
OUTPATIENT PHYSICAL THERAPY SHOULDER TREATMENT NOTE/Discharge   Patient Name: Jessica Ponce MRN: [id number] DOB:1966-01-30, 57 y.o., female Today's Date: 07/15/2023  END OF SESSION:   PT End of Session - 07/14/23 1505     Visit Number 7    Number of Visits 13    Date for PT Re-Evaluation 07/23/23    Authorization Type [place] MEDICAID HEALTHY BLUE    Authorization Time Period Appoved 7 visits 06/02/23-07/31/23    PT Start Time 1504    PT Stop Time 1545    PT Time Calculation (min) 41 min    Activity Tolerance Patient tolerated treatment well    Behavior During Therapy WFL for tasks assessed/performed                  Past Medical History:  Diagnosis Date   Allergy    Asthma    no current problems, does not use inhaler   Diabetes mellitus without complication (HCC)    type 2   Essential hypertension    Hyperlipidemia    Past Surgical History:  Procedure Laterality Date   APPENDECTOMY  1985   exploratory laparoscopy     WISDOM TOOTH EXTRACTION     Patient Active Problem List   Diagnosis Date Noted   Complicated migraine 03/21/2023   Acute ischemic stroke (HCC) 05/27/2022   Type 2 diabetes mellitus with hyperglycemia (HCC) 10/07/2021   SVT (supraventricular tachycardia) 10/07/2021   Family history of thyroid disorder 07/15/2020   Mixed hyperlipidemia 04/23/2020   Family history of breast cancer 03/11/2020   Type 2 diabetes mellitus (HCC) 03/11/2020   Hypertension, essential 07/31/2016   Family history of Alzheimer's disease 08/04/2009    PCP: Georganna Skeans, MD  REFERRING PROVIDER: Tarry Kos, MD  REFERRING DIAG: M25.511 (ICD-10-CM) - Acute pain of right shoulder   THERAPY DIAG:  Acute pain of right shoulder  Muscle weakness (generalized)  Rationale for Evaluation and Treatment: Rehabilitation  ONSET DATE: 6 months or greater  SUBJECTIVE:                                                                                                                                                                                       SUBJECTIVE STATEMENT: Pt reports 85% improvement with her R shoulder. Pain occurs primarily c reaching out to the side.  EVAL: Pt reports the pain initiated with R biceps and progressed to her shoulder. An injection she received in the R shoulder has helped to reduce the pain and improve motion.  Hand dominance: Left  PERTINENT HISTORY: DM, high BMI  PAIN:  Are you having pain? Yes: NPRS scale: 0/10 Pain location: R shoulder, biceps  Pain description: throb, Charley horse feeling Aggravating factors: Reaching above and out to the side Relieving factors: Rest, limited use Pain range on eval: 0-4/10 past week 0-4/10 certain movements  PERTINENT HISTORY: DM, high BMI  PRECAUTIONS: None  WEIGHT BEARING RESTRICTIONS: No  FALLS:  Has patient fallen in last 6 months? No  LIVING ENVIRONMENT: Lives with: lives with their family Lives in: House/apartment No issue with accessing or mobility within home  OCCUPATION: Research scientist (life sciences); enjoys knitting  PLOF: Independent  PATIENT GOALS: Better ROM with less pain  NEXT MD VISIT:   OBJECTIVE:   DIAGNOSTIC FINDINGS Xray 05/21/23 X-rays show mild degenerative changes to the [facility] joint otherwise x-rays are  unremarkable.   PATIENT SURVEYS:  FOTO: Perceived function   46%, predicted   57%   COGNITION: Overall cognitive status: Within functional limits for tasks assessed     SENSATION: WFL  POSTURE: Forward head, rounded shoulders, increased thoracic kyphosis  UPPER EXTREMITY ROM:   Active ROM Right eval Left eval Rt 07/07/23 Rt 07/14/23  Shoulder flexion A120, P120, pain at end range 140 A135 p at end range   Shoulder extension      Shoulder abduction      Shoulder adduction      Shoulder internal rotation A to PSIS, P60, pain at end range T A to PSIS, P60, pain at end range P 60, A to L3  Shoulder external rotation A to T2, P60, pain at end range  T4 A T3, P 75   Elbow flexion      Elbow extension      Wrist flexion      Wrist extension      Wrist ulnar deviation      Wrist radial deviation      Wrist pronation      Wrist supination      (Blank rows = not tested)  UPPER EXTREMITY MMT:  MMT Right eval Left eval RT 07/02/23 RT 07/07/23  Shoulder flexion 4 pain  5 no p   Shoulder extension      Shoulder abduction 4 pain  4+ c pain   Shoulder adduction      Shoulder internal rotation 4 Pain     Shoulder external rotation 4 pain  5 no p   Middle trapezius      Lower trapezius      Elbow flexion 4 pain   5 no p  Elbow extension      Wrist flexion      Wrist extension      Wrist ulnar deviation      Wrist radial deviation      Wrist pronation      Wrist supination      Grip strength (lbs)      (Blank rows = not tested)  SHOULDER SPECIAL TESTS: Impingement tests: Hawkins/Kennedy impingement test: negative SLAP lesions: Crank test: negative Rotator cuff assessment: Empty can test: negative pain without weakness Biceps assessment: Yergason's test: positive   JOINT MOBILITY TESTING:  WNLs  PALPATION:  TTP to ant shoulder and along the prox bicipital tendon    TODAY'S TREATMENT:  [facility] Adult PT Treatment:                                                DATE: 07/14/23 Therapeutic Exercise: UBE 1.5 mins FWD/BWD each 30d pectoral stretch x3  30" Sleeper stretch 3x30" Standing Shoulder Hor Abd Star pattern 3x5 reps RTB Standing Single Arm Elbow Flexion 5# 3x10 reps Forearm Pronation and Supination 3# 3x10 reps Shoulder ER 3x10 RTB Updated HEP  [facility] Adult PT Treatment:                                                DATE: 07/07/23  Therapeutic Exercise: UBE 1.5 mins FWD/BWD each 30d pectoral stretch x3 30" Sleeper stretch 3x30" Standing Shoulder Row 3x10 reps GTB Standing Single Arm Elbow Flexion 5# 3x10 reps Forearm Pronation and Supination 3# 3x10 reps Shoulder ER 3x10 YTB ROMs Therapeutic Activity: FOTO  reassessment and review  [facility] Adult PT Treatment:                                                DATE: 07/02/23  Therapeutic Exercise: UBE 1.5 mins FWD/BWD each Pulley flexion 1 min 30d pectoral stretch x3 30" Standing Shoulder Row 3x10 reps GTB Standing Single Arm Elbow Flexion 5# 2x10 reps Forearm Pronation and Supination 3# 2x10 reps Shoulder ER 2x10 YTB Manual Therapy: STM to the R GH area, upper shoulder, and posterior scapula muscles                                                                                                                              PATIENT EDUCATION: Education details: Eval findings, POC, HEP, self care Person educated: Patient Education method: Explanation, Demonstration, Tactile cues, Verbal cues, and Handouts Education comprehension: verbalized understanding, returned demonstration, verbal cues required, and tactile cues required  HOME EXERCISE PROGRAM: Access Code: [email or web address] URL: [email or web address] Date: 07/14/2023 Prepared by: Joellyn Rued  Exercises - Standing Shoulder Row with Anchored Resistance  - 1 x daily - 7 x weekly - 2-3 sets - 10 reps - 3 hold - Standing Single Arm Elbow Flexion with Resistance  - 1 x daily - 7 x weekly - 2-3 sets - 10 reps - 3 hold - Forearm Pronation and Supination with Hammer  - 1 x daily - 7 x weekly - 2-3 sets - 10 reps - 3 hold - Shoulder External Rotation and Scapular Retraction with Resistance  - 1 x daily - 7 x weekly - 2-3 sets - 10 reps - 3 hold - Standing Shoulder Horizontal Abduction with Resistance  - 1 x daily - 7 x weekly - 2-3 sets - 10 reps - 3 hold - Corner Pec Minor Stretch  - 1 x daily - 7 x weekly - 1 sets - 3 reps - 30 hold - Seated Upper Trapezius Stretch  - 1 x daily - 7 x weekly - 1 sets - 3 reps - 30 hold -  Sleeper Stretch (Mirrored)  - 2 x daily - 7 x weekly - 1 sets - 3-5 reps - 30 hold - Standing Bilateral Shoulder Internal Rotation AAROM with Dowel  - 2 x daily - 7 x weekly -  1 sets - 3-5 reps - 30 hold  ASSESSMENT:  CLINICAL IMPRESSION: Pt completed her final PT session today. She has made good progress in all areas re: pain, ROM and strength, and function of the R shoulder. Pt is Ind c a HEP to maintain and/or progress the R shoulder's current LOF. Pt is in agreement with DC from PT services at this time.    EVAL: Patient is a 57 y.o. female who was seen today for physical therapy evaluation and treatment for M25.511 (ICD-10-CM) - Acute pain of right shoulder . On today's eval pt presented with min/mod limitations for R shoulder ROMs globally. ROM was and strength were limited by pain. 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 Pt will benefit from skilled PT 2w6 to address impairments to optimize function with less pain.   OBJECTIVE IMPAIRMENTS: decreased activity tolerance, decreased ROM, decreased strength, impaired UE functional use, postural dysfunction, obesity, and pain.   ACTIVITY LIMITATIONS: carrying, lifting, sleeping, and reach over head  PARTICIPATION LIMITATIONS: meal prep, cleaning, laundry, and hobby-knitting  PERSONAL FACTORS: Fitness, Past/current experiences, Time since onset of injury/illness/exacerbation, and 1-2 comorbidities: DM, high BMI  are also affecting patient's functional outcome.   REHAB POTENTIAL: Good  CLINICAL DECISION MAKING: Stable/uncomplicated  EVALUATION COMPLEXITY: Low   GOALS:  SHORT TERM GOALS: Target date: 06/25/23  Pt will be Ind in an initial HEP Baseline: Started Goal status: MET  2.  Pt will voice understanding of measures to assist in pain reduction  Baseline: For cold pack and cross friction massage Goal status: MET  LONG TERM GOALS: Target date: 07/23/23  Pt will be Ind in a final HEP to maintain achieved LOF  Baseline: started Goal status: MET-07/14/23  2.  Increase R shoulder AROM for flexion to 135d, ER to T3, and IR L1 for improved functional use of the R  shoulder/UE Baseline: see flow sheets Goal status: MET - 07/14/23  3.  Increase R shoulder and bicep strength to 4+ or greater for improved function of the R shoulder/UE Baseline:  07/02/23: See flow sheets Goal status: MET- 07/07/23  4.  Pt will report a decrease in R shoulder pain to 0-2/10 or less for improved R shoulder/UE function and QOL Baseline:0-4/10  07/14/23: 0/10 primarily Goal status: MET  5.  Pt's FOTO score will improved to the predicted value of 57% as indication of improved function  Baseline: 46% Goal status: MET at 59% on 07/07/23  PLAN:  PT FREQUENCY: 2x/week  PT DURATION: 6 weeks  PLANNED INTERVENTIONS: Therapeutic exercises, Therapeutic activity, Neuromuscular re-education, Balance training, Gait training, Patient/Family education, Self Care, Joint mobilization, Aquatic Therapy, Dry Needling, Electrical stimulation, Cryotherapy, Moist heat, Taping, Vasopneumatic device, Ultrasound, Ionotophoresis 4mg /ml Dexamethasone, Manual therapy, and Re-evaluation  PLAN FOR NEXT SESSION:   PHYSICAL THERAPY DISCHARGE SUMMARY  Visits from Start of Care: 7  Current functional level related to goals / functional outcomes: See clinical impression and PT goals    Remaining deficits: See clinical impression and PT goals    Education / Equipment: HEP and Pt ED   Patient agrees to discharge. Patient goals were met. Patient is being discharged due to being pleased with the current functional level.   Dublin Grayer MS, PT 07/15/23 5:42 AM

## 2023-07-14 ENCOUNTER — Ambulatory Visit: Payer: Medicaid Other

## 2023-07-14 DIAGNOSIS — M25511 Pain in right shoulder: Secondary | ICD-10-CM

## 2023-07-14 DIAGNOSIS — M6281 Muscle weakness (generalized): Secondary | ICD-10-CM

## 2023-07-15 ENCOUNTER — Other Ambulatory Visit: Payer: Self-pay

## 2023-07-15 NOTE — Telephone Encounter (Signed)
Pt states she thinks the  PA for Continuous Glucose Transmitter (DEXCOM G6 TRANSMITTER) MISC.   has not been done b/c it has not been approved.  She got the approval for the transmitter. Please advise. Pt would like a call back. Pt states she has had no meter for 2 months.

## 2023-07-16 ENCOUNTER — Other Ambulatory Visit: Payer: Self-pay

## 2023-07-16 ENCOUNTER — Ambulatory Visit: Payer: Medicaid Other

## 2023-07-21 ENCOUNTER — Ambulatory Visit: Payer: Medicaid Other | Admitting: Physical Therapy

## 2023-07-23 ENCOUNTER — Ambulatory Visit: Payer: Medicaid Other

## 2023-07-29 ENCOUNTER — Telehealth: Payer: Self-pay | Admitting: Orthopaedic Surgery

## 2023-07-29 NOTE — Telephone Encounter (Signed)
Called patient left message to return call to R/S her appointment with Mardella Layman for the same day but in the afternoon.

## 2023-08-01 DIAGNOSIS — Z808 Family history of malignant neoplasm of other organs or systems: Secondary | ICD-10-CM | POA: Insufficient documentation

## 2023-08-09 ENCOUNTER — Other Ambulatory Visit: Payer: Self-pay | Admitting: Family Medicine

## 2023-08-13 ENCOUNTER — Ambulatory Visit: Payer: Medicaid Other | Admitting: Physician Assistant

## 2023-08-17 ENCOUNTER — Ambulatory Visit: Payer: Medicaid Other | Admitting: Physician Assistant

## 2023-08-19 ENCOUNTER — Ambulatory Visit: Payer: Medicaid Other | Admitting: Family Medicine

## 2023-08-26 ENCOUNTER — Emergency Department (HOSPITAL_COMMUNITY)
Admission: EM | Admit: 2023-08-26 | Discharge: 2023-08-27 | Disposition: A | Discharge status: Home / Self Care | Payer: Medicaid Other | Attending: Emergency Medicine | Admitting: Emergency Medicine

## 2023-08-26 ENCOUNTER — Emergency Department (HOSPITAL_COMMUNITY): Payer: Medicaid Other

## 2023-08-26 ENCOUNTER — Ambulatory Visit: Payer: Medicaid Other | Admitting: Family Medicine

## 2023-08-26 ENCOUNTER — Other Ambulatory Visit: Payer: Self-pay

## 2023-08-26 DIAGNOSIS — U071 COVID-19: Secondary | ICD-10-CM | POA: Insufficient documentation

## 2023-08-26 DIAGNOSIS — R0602 Shortness of breath: Secondary | ICD-10-CM | POA: Diagnosis present

## 2023-08-26 DIAGNOSIS — E119 Type 2 diabetes mellitus without complications: Secondary | ICD-10-CM | POA: Diagnosis not present

## 2023-08-26 DIAGNOSIS — Z7982 Long term (current) use of aspirin: Secondary | ICD-10-CM | POA: Insufficient documentation

## 2023-08-26 DIAGNOSIS — D72829 Elevated white blood cell count, unspecified: Secondary | ICD-10-CM | POA: Diagnosis not present

## 2023-08-26 DIAGNOSIS — Z794 Long term (current) use of insulin: Secondary | ICD-10-CM | POA: Insufficient documentation

## 2023-08-26 DIAGNOSIS — J4541 Moderate persistent asthma with (acute) exacerbation: Secondary | ICD-10-CM | POA: Diagnosis not present

## 2023-08-26 DIAGNOSIS — Z79899 Other long term (current) drug therapy: Secondary | ICD-10-CM | POA: Diagnosis not present

## 2023-08-26 DIAGNOSIS — F419 Anxiety disorder, unspecified: Secondary | ICD-10-CM | POA: Insufficient documentation

## 2023-08-26 DIAGNOSIS — I10 Essential (primary) hypertension: Secondary | ICD-10-CM | POA: Insufficient documentation

## 2023-08-26 LAB — SARS CORONAVIRUS 2 BY RT PCR: SARS Coronavirus 2 by RT PCR: POSITIVE — AB

## 2023-08-26 MED ORDER — MAGNESIUM SULFATE 2 GM/50ML IV SOLN
2.0000 g | Freq: Once | INTRAVENOUS | Status: AC
  Administered 2023-08-27: 2 g via INTRAVENOUS

## 2023-08-26 MED ORDER — ALBUTEROL SULFATE HFA 108 (90 BASE) MCG/ACT IN AERS
2.0000 | INHALATION_SPRAY | RESPIRATORY_TRACT | Status: DC | PRN
  Administered 2023-08-26: 2 via RESPIRATORY_TRACT
  Filled 2023-08-26: qty 6.7

## 2023-08-26 NOTE — ED Triage Notes (Signed)
Pt arrives via POV form Urgent Care d/t concerns for SOB and wheezing. Was given duoneb tx and 10 mg IM dexamethasone.
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
## 2023-08-27 ENCOUNTER — Ambulatory Visit: Payer: Medicaid Other | Admitting: Physician Assistant

## 2023-08-27 LAB — CBC WITH DIFFERENTIAL/PLATELET
Abs Immature Granulocytes: 0.15 10*3/uL — ABNORMAL HIGH (ref 0.00–0.07)
Basophils Absolute: 0.1 10*3/uL (ref 0.0–0.1)
Basophils Relative: 0 %
Eosinophils Absolute: 0.3 10*3/uL (ref 0.0–0.5)
Eosinophils Relative: 2 %
HCT: 38.7 % (ref 36.0–46.0)
Hemoglobin: 12.7 g/dL (ref 12.0–15.0)
Immature Granulocytes: 1 %
Lymphocytes Relative: 10 %
Lymphs Abs: 1.8 10*3/uL (ref 0.7–4.0)
MCH: 29.3 pg (ref 26.0–34.0)
MCHC: 32.8 g/dL (ref 30.0–36.0)
MCV: 89.2 fL (ref 80.0–100.0)
Monocytes Absolute: 0.4 10*3/uL (ref 0.1–1.0)
Monocytes Relative: 2 %
Neutro Abs: 14.8 10*3/uL — ABNORMAL HIGH (ref 1.7–7.7)
Neutrophils Relative %: 85 %
Platelets: 309 10*3/uL (ref 150–400)
RBC: 4.34 MIL/uL (ref 3.87–5.11)
RDW: 13.2 % (ref 11.5–15.5)
WBC: 17.5 10*3/uL — ABNORMAL HIGH (ref 4.0–10.5)
nRBC: 0 % (ref 0.0–0.2)

## 2023-08-27 LAB — BASIC METABOLIC PANEL
Anion gap: 12 (ref 5–15)
BUN: 14 mg/dL (ref 6–20)
CO2: 22 mmol/L (ref 22–32)
Calcium: 9.1 mg/dL (ref 8.9–10.3)
Chloride: 104 mmol/L (ref 98–111)
Creatinine, Ser: 0.83 mg/dL (ref 0.44–1.00)
GFR, Estimated: >60 mL/min (ref >60)
Glucose, Bld: 257 mg/dL — ABNORMAL HIGH (ref 70–99)
Potassium: 3.7 mmol/L (ref 3.5–5.1)
Sodium: 138 mmol/L (ref 135–145)

## 2023-08-27 NOTE — Discharge Instructions (Signed)
Coricidin HBP as needed as directed for symptom control. Continue nebulizer and inhaler treatments as needed. Recheck with your PCP next week. Return to the ER for worsening or concerning symptoms.

## 2023-09-02 ENCOUNTER — Ambulatory Visit: Payer: Medicaid Other | Admitting: Orthopaedic Surgery

## 2023-09-02 ENCOUNTER — Encounter: Payer: Self-pay | Admitting: Orthopaedic Surgery

## 2023-09-02 DIAGNOSIS — M7501 Adhesive capsulitis of right shoulder: Secondary | ICD-10-CM

## 2023-09-02 NOTE — Progress Notes (Signed)
Office Visit Note   Patient: Jessica Ponce           Date of Birth: 11-28-1966           MRN: [id number] Visit Date: 09/02/2023              Requested by: Georganna Skeans, MD [address] [place],  [place] [address] PCP: Georganna Skeans, MD   Assessment & Plan: Visit Diagnoses:  1. Adhesive capsulitis of right shoulder     Plan: Jessica Ponce is a 57 year old female with resolving right shoulder adhesive capsulitis.  She has had 2 injections and a full course of physical therapy.  She is about 85% better.  From my standpoint she does not need any more injections or advanced imaging.  I think as long as her diabetic control continues to improve I would expect full resolution.  In the meantime she will continue doing exercises.  Follow-up as needed.  Follow-Up Instructions: No follow-ups on file.   Orders:  No orders of the defined types were placed in this encounter.  No orders of the defined types were placed in this encounter.     Procedures: No procedures performed   Clinical Data: No additional findings.   Subjective: Chief Complaint  Patient presents with   Right Shoulder - Pain    HPI Jessica Ponce returns today for follow-up for right shoulder adhesive capsulitis.  She has had 2 glenohumeral injections.  She feels that she is about 85% better.  She had to be placed on steroids because of post COVID asthma.  This has caused her A1c to increase. Review of Systems  Constitutional: Negative.   HENT: Negative.    Eyes: Negative.   Respiratory: Negative.    Cardiovascular: Negative.   Endocrine: Negative.   Musculoskeletal: Negative.   Neurological: Negative.   Hematological: Negative.   Psychiatric/Behavioral: Negative.    All other systems reviewed and are negative.    Objective: Vital Signs: LMP 12/10/2015 (LMP Unknown)   Physical Exam Vitals and nursing note reviewed.  Constitutional:      Appearance: She is well-developed.  HENT:     Head:  Normocephalic and atraumatic.  Pulmonary:     Effort: Pulmonary effort is normal.  Abdominal:     Palpations: Abdomen is soft.  Musculoskeletal:     Cervical back: Neck supple.  Skin:    General: Skin is warm.     Capillary Refill: Capillary refill takes less than 2 seconds.  Neurological:     Mental Status: She is alert and oriented to person, place, and time.  Psychiatric:        Behavior: Behavior normal.        Thought Content: Thought content normal.        Judgment: Judgment normal.     Ortho Exam Exam of the right shoulder shows near full range of motion with mild discomfort.  Manual muscle testing is normal. Specialty Comments:  No specialty comments available.  Imaging: No results found.   PMFS History: Patient Active Problem List   Diagnosis Date Noted   Complicated migraine 03/21/2023   Acute ischemic stroke (HCC) 05/27/2022   Type 2 diabetes mellitus with hyperglycemia (HCC) 10/07/2021   SVT (supraventricular tachycardia) (HCC) 10/07/2021   Family history of thyroid disorder 07/15/2020   Mixed hyperlipidemia 04/23/2020   Family history of breast cancer 03/11/2020   Type 2 diabetes mellitus (HCC) 03/11/2020   Hypertension, essential 07/31/2016   Family history of Alzheimer's disease 08/04/2009  Past Medical History:  Diagnosis Date   Allergy    Asthma    no current problems, does not use inhaler   Diabetes mellitus without complication (HCC)    type 2   Essential hypertension    Hyperlipidemia     Family History  Problem Relation Age of Onset   Memory loss Mother    Breast cancer Mother 68   Stroke Father    Breast cancer Maternal Aunt 74   Colon cancer Neg Hx    Rectal cancer Neg Hx    Stomach cancer Neg Hx    Esophageal cancer Neg Hx     Past Surgical History:  Procedure Laterality Date   APPENDECTOMY  1985   exploratory laparoscopy     WISDOM TOOTH EXTRACTION     Social History   Occupational History   Not on file  Tobacco Use    Smoking status: Never    Passive exposure: Never   Smokeless tobacco: Never  Vaping Use   Vaping status: Never Used  Substance and Sexual Activity   Alcohol use: Yes    Alcohol/week: 0.0 standard drinks of alcohol    Comment: occasional wine   Drug use: No   Sexual activity: Yes    Birth control/protection: Post-menopausal

## 2023-09-09 ENCOUNTER — Ambulatory Visit: Payer: Medicaid Other | Admitting: Dermatology

## 2023-09-09 ENCOUNTER — Encounter: Payer: Self-pay | Admitting: Dermatology

## 2023-09-09 VITALS — BP 127/77 | HR 86

## 2023-09-09 DIAGNOSIS — L814 Other melanin hyperpigmentation: Secondary | ICD-10-CM

## 2023-09-09 DIAGNOSIS — D1801 Hemangioma of skin and subcutaneous tissue: Secondary | ICD-10-CM | POA: Diagnosis not present

## 2023-09-09 DIAGNOSIS — Z808 Family history of malignant neoplasm of other organs or systems: Secondary | ICD-10-CM

## 2023-09-09 DIAGNOSIS — Z1283 Encounter for screening for malignant neoplasm of skin: Secondary | ICD-10-CM | POA: Diagnosis not present

## 2023-09-09 DIAGNOSIS — L719 Rosacea, unspecified: Secondary | ICD-10-CM | POA: Diagnosis not present

## 2023-09-09 DIAGNOSIS — L578 Other skin changes due to chronic exposure to nonionizing radiation: Secondary | ICD-10-CM

## 2023-09-09 DIAGNOSIS — B353 Tinea pedis: Secondary | ICD-10-CM | POA: Diagnosis not present

## 2023-09-09 DIAGNOSIS — D229 Melanocytic nevi, unspecified: Secondary | ICD-10-CM

## 2023-09-09 DIAGNOSIS — W908XXA Exposure to other nonionizing radiation, initial encounter: Secondary | ICD-10-CM

## 2023-09-09 DIAGNOSIS — L821 Other seborrheic keratosis: Secondary | ICD-10-CM

## 2023-09-09 MED ORDER — DOXYCYCLINE HYCLATE 50 MG PO CAPS
50.0000 mg | ORAL_CAPSULE | Freq: Every day | ORAL | 5 refills | Status: DC
Start: 2023-09-09 — End: 2024-04-10

## 2023-09-09 MED ORDER — METRONIDAZOLE 0.75 % EX CREA
TOPICAL_CREAM | Freq: Two times a day (BID) | CUTANEOUS | 3 refills | Status: AC
Start: 2023-09-09 — End: 2024-09-08

## 2023-09-09 MED ORDER — TERBINAFINE HCL 1 % EX CREA
1.0000 | TOPICAL_CREAM | Freq: Two times a day (BID) | CUTANEOUS | 3 refills | Status: DC
Start: 2023-09-09 — End: 2023-11-04

## 2023-09-09 NOTE — Patient Instructions (Addendum)
For rosacea wash with sulphur soap--you can buy on [employer]  Skin Education : We counseled the patient regarding the following: Sun screen (SPF 30 or greater) should be applied during peak UV exposure (between 10am and 2pm) and reapplied after exercise or swimming.  The ABCDEs of melanoma were reviewed with the patient, and the importance of monthly self-examination of moles was emphasized. Should any moles change in shape or color, or itch, bleed or burn, pt will contact our office for evaluation sooner then their interval appointment.  Plan: Sunscreen Recommendations We recommended a broad spectrum sunscreen with a SPF of 30 or higher.  SPF 30 sunscreens block approximately 97 percent of the sun's harmful rays. Sunscreens should be applied at least 15 minutes prior to expected sun exposure and then every 2 hours after that as long as sun exposure continues. If swimming or exercising sunscreen should be reapplied every 45 minutes to an hour after getting wet or sweating. One ounce, or the equivalent of a shot glass full of sunscreen, is adequate to protect the skin not covered by a bathing suit. We also recommended a lip balm with a sunscreen as well. Sun protective clothing can be used in lieu of sunscreen but must be worn the entire time you are exposed to the sun's rays.   Important Information   Due to recent changes in healthcare laws, you may see results of your pathology and/or laboratory studies on MyChart before the doctors have had a chance to review them. We understand that in some cases there may be results that are confusing or concerning to you. Please understand that not all results are received at the same time and often the doctors may need to interpret multiple results in order to provide you with the best plan of care or course of treatment. Therefore, we ask that you please give [place] 2 business days to thoroughly review all your results before contacting the office for clarification.  Should we see a critical lab result, you will be contacted sooner.     If You Need Anything After Your Visit   If you have any questions or concerns for your doctor, please call our main line at [phone]. If no one answers, please leave a voicemail as directed and we will return your call as soon as possible. Messages left after 4 pm will be answered the following business day.    You may also send [place] a message via MyChart. We typically respond to MyChart messages within 1-2 business days.  For prescription refills, please ask your pharmacy to contact our office. Our fax number is [phone].  If you have an urgent issue when the clinic is closed that cannot wait until the next business day, you can page your doctor at the number below.     Please note that while we do our best to be available for urgent issues outside of office hours, we are not available 24/7.    If you have an urgent issue and are unable to reach [place], you may choose to seek medical care at your doctor's office, retail clinic, urgent care center, or emergency room.   If you have a medical emergency, please immediately call 911 or go to the emergency department. In the event of inclement weather, please call our main line at [phone] for an update on the status of any delays or closures.  Dermatology Medication Tips: Please keep the boxes that topical medications come in in order to help keep track  of the instructions about where and how to use these. Pharmacies typically print the medication instructions only on the boxes and not directly on the medication tubes.   If your medication is too expensive, please contact our office at [phone] or send [place] a message through MyChart.    We are unable to tell what your co-pay for medications will be in advance as this is different depending on your insurance coverage. However, we may be able to find a substitute medication at lower cost or fill out paperwork to get  insurance to cover a needed medication.    If a prior authorization is required to get your medication covered by your insurance company, please allow [place] 1-2 business days to complete this process.   Drug prices often vary depending on where the prescription is filled and some pharmacies may offer cheaper prices.   The website www.goodrx.com contains coupons for medications through different pharmacies. The prices here do not account for what the cost may be with help from insurance (it may be cheaper with your insurance), but the website can give you the price if you did not use any insurance.  - You can print the associated coupon and take it with your prescription to the pharmacy.  - You may also stop by our office during regular business hours and pick up a GoodRx coupon card.  - If you need your prescription sent electronically to a different pharmacy, notify our office through [facility] or by phone at [phone]

## 2023-09-09 NOTE — Progress Notes (Signed)
New Patient Visit   Subjective  Jessica Ponce is a 57 y.o. female who presents for the following: Skin Cancer Screening and Full Body Skin Exam  The patient presents for Total-Body Skin Exam (TBSE) for skin cancer screening and mole check. The patient has spots, moles and lesions to be evaluated, some may be new or changing. Pt has no hx of skin cancer but has family hx of MM.  Sister recently diagnosed with melanoma  Patient also has lesions on her face, that she thinks are consistent with rosacea, present on and off for years, patient is not currently using anything prescription.   The following portions of the chart were reviewed this encounter and updated as appropriate: medications, allergies, medical history  Review of Systems:  No other skin or systemic complaints except as noted in HPI or Assessment and Plan.  Objective  Well appearing patient in no apparent distress; mood and affect are within normal limits.  A full examination was performed including scalp, head, eyes, ears, nose, lips, neck, chest, axillae, abdomen, back, buttocks, bilateral upper extremities, bilateral lower extremities, hands, feet, fingers, toes, fingernails, and toenails. All findings within normal limits unless otherwise noted below.   Relevant physical exam findings are noted in the Assessment and Plan.   Assessment & Plan   SKIN CANCER SCREENING PERFORMED TODAY.  ACTINIC DAMAGE - Chronic condition, secondary to cumulative UV/sun exposure - diffuse scaly erythematous macules with underlying dyspigmentation - Recommend daily broad spectrum sunscreen SPF 30+ to sun-exposed areas, reapply every 2 hours as needed.  - Staying in the shade or wearing long sleeves, sun glasses (UVA+UVB protection) and wide brim hats (4-inch brim around the entire circumference of the hat) are also recommended for sun protection.  - Call for new or changing lesions.  MELANOCYTIC NEVI - Tan-brown and/or  pink-flesh-colored symmetric macules and papules - Benign appearing on exam today - Observation - Call clinic for new or changing moles - Recommend daily use of broad spectrum spf 30+ sunscreen to sun-exposed areas.   SEBORRHEIC KERATOSIS - Stuck-on, waxy, tan-brown papules and/or plaques  - Benign-appearing - Discussed benign etiology and prognosis. - Observe - Call for any changes  LENTIGINES Exam: scattered tan macules Due to sun exposure Treatment Plan: Benign-appearing, observe. Recommend daily broad spectrum sunscreen SPF 30+ to sun-exposed areas, reapply every 2 hours as needed.  Call for any changes    HEMANGIOMA Exam: red papule(s) Discussed benign nature. Recommend observation. Call for changes.   ROSACEA Exam Mid face erythema with telangiectasias +/- scattered inflammatory papules  Chronic and persistent condition with duration or expected duration over one year. Condition is bothersome/symptomatic for patient. Currently flared.   Rosacea is a chronic progressive skin condition usually affecting the face of adults, causing redness and/or acne bumps. It is treatable but not curable. It sometimes affects the eyes (ocular rosacea) as well. It may respond to topical and/or systemic medication and can flare with stress, sun exposure, alcohol, exercise, topical steroids (including hydrocortisone/cortisone 10) and some foods.  Daily application of broad spectrum spf 30+ sunscreen to face is recommended to reduce flares.  Treatment Plan Uses sulfur soap to wash face  -Start Doxycycline 50 mg daily -Apply Metrocream twice a day  TINEA PEDIS- acute condition not at treatment goal Exam: Scaling and maceration web spaces and over distal and lateral soles.  Treatment Plan: Terbinifine topical apply to feet bid   Return in about 3 months (around 12/10/2023) for Rosacea f/u.  I, Tillie Fantasia,  CMA, am acting as scribe for Gwenith Daily, MD.   We spent 45 min reviewing  records, taking the patient history, providing face to face care with the patient, sending prescriptions.  Documentation: I have reviewed the above documentation for accuracy and completeness, and I agree with the above.  Gwenith Daily, MD

## 2023-09-17 ENCOUNTER — Encounter: Payer: Self-pay | Admitting: Family Medicine

## 2023-09-17 ENCOUNTER — Ambulatory Visit: Payer: Medicaid Other | Admitting: Family Medicine

## 2023-09-17 VITALS — BP 137/88 | HR 83 | Temp 98.3°F | Resp 18 | Ht 65.0 in | Wt 211.4 lb

## 2023-09-17 DIAGNOSIS — J4520 Mild intermittent asthma, uncomplicated: Secondary | ICD-10-CM | POA: Diagnosis not present

## 2023-09-17 DIAGNOSIS — E782 Mixed hyperlipidemia: Secondary | ICD-10-CM

## 2023-09-17 DIAGNOSIS — E1165 Type 2 diabetes mellitus with hyperglycemia: Secondary | ICD-10-CM

## 2023-09-17 DIAGNOSIS — R0609 Other forms of dyspnea: Secondary | ICD-10-CM

## 2023-09-17 DIAGNOSIS — Z794 Long term (current) use of insulin: Secondary | ICD-10-CM

## 2023-09-17 DIAGNOSIS — U099 Post covid-19 condition, unspecified: Secondary | ICD-10-CM

## 2023-09-17 DIAGNOSIS — I10 Essential (primary) hypertension: Secondary | ICD-10-CM | POA: Diagnosis not present

## 2023-09-17 LAB — POCT GLYCOSYLATED HEMOGLOBIN (HGB A1C): Hemoglobin A1C: 10.4 % — AB (ref 4.0–5.6)

## 2023-09-17 NOTE — Progress Notes (Signed)
Established Patient Office Visit  Subjective    Patient ID: Jessica Ponce, female    DOB: 1966/11/28  Age: 57 y.o. MRN: [id number]  CC:  Chief Complaint  Patient presents with   Follow-up    Wheezing, trouble with asthma    HPI Jessica Ponce presents with complaint of SOB, wheezing which has been persistent since she had Covid in September 2024. She has been to the ED several times and has had several chest xrays that have not shown any acute abnormalities. She has also had EKG. She also has had issues managing her blood sugars which she relates to steroids given for her breathing.    Outpatient Encounter Medications as of 09/17/2023  Medication Sig   amLODipine (NORVASC) 10 MG tablet TAKE 1 TABLET BY MOUTH EVERY DAY   aspirin EC 81 MG tablet Take 1 tablet (81 mg total) by mouth daily. Swallow whole.   buPROPion (WELLBUTRIN XL) 150 MG 24 hr tablet TAKE 1 TABLET BY MOUTH EVERY DAY   Continuous Glucose Sensor (DEXCOM G6 SENSOR) MISC Check blood sugar before meals 3 times daily   Continuous Glucose Transmitter (DEXCOM G6 TRANSMITTER) MISC Use to check blood sugar continuously throughout the day. Change once every 90 days.   doxycycline (VIBRAMYCIN) 50 MG capsule Take 1 capsule (50 mg total) by mouth daily.   gabapentin (NEURONTIN) 300 MG capsule TAKE 1 CAPSULE BY MOUTH THREE TIMES A DAY   Insulin Lispro Prot & Lispro (HUMALOG 75/25 MIX) (75-25) 100 UNIT/ML Kwikpen INJECT 20 UNITS INTO THE SKIN 2 TIMES DAILY WITH A MEAL.   Insulin Pen Needle (BD PEN NEEDLE NANO 2ND GEN) 32G X 4 MM MISC 1 APPLICATION BY DOES NOT APPLY ROUTE DAILY.   losartan (COZAAR) 100 MG tablet TAKE 1 TABLET BY MOUTH EVERY DAY   metroNIDAZOLE (METROCREAM) 0.75 % cream Apply topically 2 (two) times daily.   sertraline (ZOLOFT) 100 MG tablet TAKE 1 TABLET BY MOUTH EVERY DAY   terbinafine (LAMISIL) 1 % cream Apply 1 Application topically 2 (two) times daily.   No facility-administered encounter medications on file as of  09/17/2023.    Past Medical History:  Diagnosis Date   Allergy    Asthma    no current problems, does not use inhaler   Diabetes mellitus without complication (HCC)    type 2   Essential hypertension    Hyperlipidemia     Past Surgical History:  Procedure Laterality Date   APPENDECTOMY  1985   exploratory laparoscopy     WISDOM TOOTH EXTRACTION      Family History  Problem Relation Age of Onset   Memory loss Mother    Breast cancer Mother 57   Stroke Father    Breast cancer Maternal Aunt 74   Colon cancer Neg Hx    Rectal cancer Neg Hx    Stomach cancer Neg Hx    Esophageal cancer Neg Hx     Social History   Socioeconomic History   Marital status: Married    Spouse name: [name]   Number of children: 2   Years of education: 16   Highest education level: Bachelor's degree (e.g., BA, AB, BS)  Occupational History   Not on file  Tobacco Use   Smoking status: Never    Passive exposure: Never   Smokeless tobacco: Never  Vaping Use   Vaping status: Never Used  Substance and Sexual Activity   Alcohol use: Yes    Alcohol/week: 0.0 standard drinks of alcohol  Comment: occasional wine   Drug use: No   Sexual activity: Yes    Birth control/protection: Post-menopausal  Other Topics Concern   Not on file  Social History Narrative   Lives with husband and 2 kids   Caffeine use: Drinks 2-3 cups coffee per day   Social Determinants of Health   Financial Resource Strain: Medium Risk (05/14/2023)   Overall Financial Resource Strain (CARDIA)    Difficulty of Paying Living Expenses: Somewhat hard  Food Insecurity: Food Insecurity Present (05/14/2023)   Hunger Vital Sign    Worried About Running Out of Food in the Last Year: Sometimes true    Ran Out of Food in the Last Year: Never true  Transportation Needs: No Transportation Needs (05/14/2023)   PRAPARE - Administrator, Civil Service (Medical): No    Lack of Transportation (Non-Medical): No  Physical  Activity: Insufficiently Active (05/14/2023)   Exercise Vital Sign    Days of Exercise per Week: 2 days    Minutes of Exercise per Session: 20 min  Stress: Stress Concern Present (05/14/2023)   [employer] of Occupational Health - Occupational Stress Questionnaire    Feeling of Stress : Very much  Social Connections: Moderately Isolated (05/14/2023)   Social Connection and Isolation Panel [NHANES]    Frequency of Communication with Friends and Family: Once a week    Frequency of Social Gatherings with Friends and Family: Once a week    Attends Religious Services: 1 to 4 times per year    Active Member of [employer] or Organizations: No    Attends Engineer, structural: Not on file    Marital Status: Married  Catering manager Violence: Not on file    Review of Systems  Constitutional:  Negative for chills and fever.  Respiratory:  Positive for cough, shortness of breath and wheezing.   All other systems reviewed and are negative.       Objective    BP 137/88 (BP Location: Right Arm, Patient Position: Sitting, Cuff Size: Normal)   Pulse 83   Temp 98.3 F (36.8 C) (Oral)   Resp 18   Ht 5\' 5"  (1.651 m)   Wt 211 lb 6.4 oz (95.9 kg)   LMP 12/10/2015 (LMP Unknown)   SpO2 93%   BMI 35.18 kg/m   Physical Exam Vitals and nursing note reviewed.  Constitutional:      General: She is not in acute distress.    Appearance: She is obese.  Cardiovascular:     Rate and Rhythm: Normal rate and regular rhythm.  Pulmonary:     Effort: Pulmonary effort is normal.     Breath sounds: Wheezing present.  Abdominal:     Palpations: Abdomen is soft.     Tenderness: There is no abdominal tenderness.  Neurological:     General: No focal deficit present.     Mental Status: She is alert and oriented to person, place, and time.         Assessment & Plan:  1. Type 2 diabetes mellitus with hyperglycemia, with long-term current use of insulin (HCC) Elevated A1c and not at goal. ?  Hindered by steroid use.  Referred to [facility] for med management - HgB A1c  2. COVID-19 long hauler manifesting chronic dyspnea Referral for further eval/mgt - Ambulatory referral to Pulmonology  3. Mild intermittent asthma without complication As above - Ambulatory referral to Pulmonology  4. Hypertension, essential Appears stable. continue  5. Mixed hyperlipidemia continue  Return in about 3 months (around 12/18/2023).   Tommie Raymond, MD

## 2023-09-19 ENCOUNTER — Emergency Department (HOSPITAL_BASED_OUTPATIENT_CLINIC_OR_DEPARTMENT_OTHER): Payer: Medicaid Other

## 2023-09-19 ENCOUNTER — Emergency Department (HOSPITAL_BASED_OUTPATIENT_CLINIC_OR_DEPARTMENT_OTHER)
Admission: EM | Admit: 2023-09-19 | Discharge: 2023-09-19 | Disposition: A | Discharge status: Home / Self Care | Payer: Medicaid Other | Attending: Emergency Medicine | Admitting: Emergency Medicine

## 2023-09-19 ENCOUNTER — Other Ambulatory Visit: Payer: Self-pay

## 2023-09-19 ENCOUNTER — Encounter (HOSPITAL_BASED_OUTPATIENT_CLINIC_OR_DEPARTMENT_OTHER): Payer: Self-pay | Admitting: Emergency Medicine

## 2023-09-19 DIAGNOSIS — I10 Essential (primary) hypertension: Secondary | ICD-10-CM | POA: Diagnosis not present

## 2023-09-19 DIAGNOSIS — E119 Type 2 diabetes mellitus without complications: Secondary | ICD-10-CM | POA: Insufficient documentation

## 2023-09-19 DIAGNOSIS — R0600 Dyspnea, unspecified: Secondary | ICD-10-CM | POA: Diagnosis not present

## 2023-09-19 DIAGNOSIS — Z7982 Long term (current) use of aspirin: Secondary | ICD-10-CM | POA: Insufficient documentation

## 2023-09-19 DIAGNOSIS — R0602 Shortness of breath: Secondary | ICD-10-CM | POA: Diagnosis present

## 2023-09-19 DIAGNOSIS — Z794 Long term (current) use of insulin: Secondary | ICD-10-CM | POA: Insufficient documentation

## 2023-09-19 DIAGNOSIS — Z8616 Personal history of COVID-19: Secondary | ICD-10-CM | POA: Insufficient documentation

## 2023-09-19 DIAGNOSIS — J45909 Unspecified asthma, uncomplicated: Secondary | ICD-10-CM | POA: Insufficient documentation

## 2023-09-19 DIAGNOSIS — Z79899 Other long term (current) drug therapy: Secondary | ICD-10-CM | POA: Insufficient documentation

## 2023-09-19 LAB — BASIC METABOLIC PANEL
Anion gap: 13 (ref 5–15)
BUN: 13 mg/dL (ref 6–20)
CO2: 22 mmol/L (ref 22–32)
Calcium: 9.4 mg/dL (ref 8.9–10.3)
Chloride: 99 mmol/L (ref 98–111)
Creatinine, Ser: 0.91 mg/dL (ref 0.44–1.00)
GFR, Estimated: >60 mL/min (ref >60)
Glucose, Bld: 374 mg/dL — ABNORMAL HIGH (ref 70–99)
Potassium: 3.6 mmol/L (ref 3.5–5.1)
Sodium: 134 mmol/L — ABNORMAL LOW (ref 135–145)

## 2023-09-19 LAB — CBC
HCT: 40.3 % (ref 36.0–46.0)
Hemoglobin: 13.8 g/dL (ref 12.0–15.0)
MCH: 29 pg (ref 26.0–34.0)
MCHC: 34.2 g/dL (ref 30.0–36.0)
MCV: 84.7 fL (ref 80.0–100.0)
Platelets: 367 10*3/uL (ref 150–400)
RBC: 4.76 MIL/uL (ref 3.87–5.11)
RDW: 13 % (ref 11.5–15.5)
WBC: 12.4 10*3/uL — ABNORMAL HIGH (ref 4.0–10.5)
nRBC: 0 % (ref 0.0–0.2)

## 2023-09-19 LAB — CBG MONITORING, ED: Glucose-Capillary: 262 mg/dL — ABNORMAL HIGH (ref 70–99)

## 2023-09-19 MED ORDER — ALBUTEROL SULFATE (2.5 MG/3ML) 0.083% IN NEBU
INHALATION_SOLUTION | RESPIRATORY_TRACT | Status: AC
  Administered 2023-09-19: 2.5 mg via RESPIRATORY_TRACT
  Filled 2023-09-19: qty 3

## 2023-09-19 MED ORDER — ALBUTEROL SULFATE (2.5 MG/3ML) 0.083% IN NEBU: 2.5000 mg | INHALATION_SOLUTION | Freq: Once | RESPIRATORY_TRACT | Status: AC

## 2023-09-19 MED ORDER — IOHEXOL 350 MG/ML SOLN
75.0000 mL | Freq: Once | INTRAVENOUS | Status: AC | PRN
  Administered 2023-09-19: 75 mL via INTRAVENOUS

## 2023-09-19 MED ORDER — IPRATROPIUM-ALBUTEROL 0.5-2.5 (3) MG/3ML IN SOLN: 3.0000 mL | Freq: Once | RESPIRATORY_TRACT | Status: AC

## 2023-09-19 MED ORDER — INSULIN ASPART 100 UNIT/ML IJ SOLN
4.0000 [IU] | Freq: Once | INTRAMUSCULAR | Status: AC
  Administered 2023-09-19: 4 [IU] via SUBCUTANEOUS

## 2023-09-19 MED ORDER — IPRATROPIUM-ALBUTEROL 0.5-2.5 (3) MG/3ML IN SOLN
RESPIRATORY_TRACT | Status: AC
  Administered 2023-09-19: 3 mL via RESPIRATORY_TRACT
  Filled 2023-09-19: qty 3

## 2023-09-19 NOTE — ED Notes (Signed)
CBG 262. °

## 2023-09-19 NOTE — ED Triage Notes (Signed)
Pt c/o continued [facility] since she had Covid in August;  has been seen several times for same; RT in to assess

## 2023-09-19 NOTE — Discharge Instructions (Signed)
Follow-up with your primary care doctor and pulmonology as planned.

## 2023-09-19 NOTE — ED Provider Notes (Signed)
[place] [facility] Provider Note   CSN: [id number] Arrival date & time: 09/19/23  1811     History  Chief Complaint  Patient presents with   Shortness of Breath    Jessica Ponce is a 57 y.o. female.   Shortness of Breath Patient presents with shortness of breath over the last couple months.  Has been seen by PCP.  Has an appointment to see pulmonology.  Has had shortness of breath since previous COVID infection.  Is diabetic.  States sugars have gone up with steroids in the past.  Occasional cough.  Has some wheezes at times.    Past Medical History:  Diagnosis Date   Allergy    Asthma    no current problems, does not use inhaler   Diabetes mellitus without complication (HCC)    type 2   Essential hypertension    Hyperlipidemia     Home Medications Prior to Admission medications   Medication Sig Start Date End Date Taking? Authorizing Provider  amLODipine (NORVASC) 10 MG tablet TAKE 1 TABLET BY MOUTH EVERY DAY 03/17/23   Georganna Skeans, MD  aspirin EC 81 MG tablet Take 1 tablet (81 mg total) by mouth daily. Swallow whole. 05/30/22   Elmer Picker, NP  buPROPion (WELLBUTRIN XL) 150 MG 24 hr tablet TAKE 1 TABLET BY MOUTH EVERY DAY 08/10/23   Georganna Skeans, MD  Continuous Glucose Sensor (DEXCOM G6 SENSOR) MISC Check blood sugar before meals 3 times daily 06/22/23   Georganna Skeans, MD  Continuous Glucose Transmitter (DEXCOM G6 TRANSMITTER) MISC Use to check blood sugar continuously throughout the day. Change once every 90 days. 06/18/23   Hoy Register, MD  doxycycline (VIBRAMYCIN) 50 MG capsule Take 1 capsule (50 mg total) by mouth daily. 09/09/23   Gwenith Daily, MD  gabapentin (NEURONTIN) 300 MG capsule TAKE 1 CAPSULE BY MOUTH THREE TIMES A DAY 08/14/22   Georganna Skeans, MD  Insulin Lispro Prot & Lispro (HUMALOG 75/25 MIX) (75-25) 100 UNIT/ML Kwikpen INJECT 20 UNITS INTO THE SKIN 2 TIMES DAILY WITH A MEAL. 07/01/23   Georganna Skeans, MD   Insulin Pen Needle (BD PEN NEEDLE NANO 2ND GEN) 32G X 4 MM MISC 1 APPLICATION BY DOES NOT APPLY ROUTE DAILY. 12/04/22   Georganna Skeans, MD  losartan (COZAAR) 100 MG tablet TAKE 1 TABLET BY MOUTH EVERY DAY 04/01/23   Georganna Skeans, MD  metroNIDAZOLE (METROCREAM) 0.75 % cream Apply topically 2 (two) times daily. 09/09/23 09/08/24  Gwenith Daily, MD  sertraline (ZOLOFT) 100 MG tablet TAKE 1 TABLET BY MOUTH EVERY DAY 07/13/23   Georganna Skeans, MD  terbinafine (LAMISIL) 1 % cream Apply 1 Application topically 2 (two) times daily. 09/09/23   Gwenith Daily, MD      Allergies    Valsartan, Atorvastatin, Codeine, and Metformin and related    Review of Systems   Review of Systems  Respiratory:  Positive for shortness of breath.     Physical Exam Updated Vital Signs BP (!) 149/88   Pulse 76   Temp 97.9 F (36.6 C) (Oral)   Resp 18   LMP 12/10/2015 (LMP Unknown)   SpO2 95%  Physical Exam Vitals and nursing note reviewed.  Cardiovascular:     Rate and Rhythm: Regular rhythm.  Pulmonary:     Breath sounds: Wheezing present.     Comments: Somewhat harsh breath sounds.  No focal wheezing. Chest:     Chest wall: No tenderness.  Musculoskeletal:  Right lower leg: No edema.     Left lower leg: No edema.  Neurological:     Mental Status: She is alert.     ED Results / Procedures / Treatments   Labs (all labs ordered are listed, but only abnormal results are displayed) Labs Reviewed  BASIC METABOLIC PANEL - Abnormal; Notable for the following components:      Result Value   Sodium 134 (*)    Glucose, Bld 374 (*)    All other components within normal limits  CBC - Abnormal; Notable for the following components:   WBC 12.4 (*)    All other components within normal limits  CBG MONITORING, ED - Abnormal; Notable for the following components:   Glucose-Capillary 262 (*)    All other components within normal limits    EKG None  Radiology CT Angio Chest PE W and/or Wo  Contrast  Result Date: 09/19/2023 CLINICAL DATA:  Shortness of breath following [place] COVID infection, initial encounter EXAM: CT ANGIOGRAPHY CHEST WITH CONTRAST TECHNIQUE: Multidetector CT imaging of the chest was performed using the standard protocol during bolus administration of intravenous contrast. Multiplanar CT image reconstructions and MIPs were obtained to evaluate the vascular anatomy. RADIATION DOSE REDUCTION: This exam was performed according to the departmental dose-optimization program which includes automated exposure control, adjustment of the mA and/or kV according to patient size and/or use of iterative reconstruction technique. CONTRAST:  75mL OMNIPAQUE IOHEXOL 350 MG/ML SOLN COMPARISON:  Chest x-ray from earlier in the same day. FINDINGS: Cardiovascular: Thoracic aorta shows no aneurysmal dilatation or evidence of dissection. No cardiac enlargement is seen. No coronary calcifications are noted. The pulmonary artery shows a normal branching pattern bilaterally. No filling defect to suggest pulmonary embolism is seen. Mediastinum/Nodes: Thoracic inlet is within normal limits. No hilar or mediastinal adenopathy is noted. The esophagus as visualized is within normal limits. Lungs/Pleura: Lungs are well aerated bilaterally. No focal infiltrate or sizable effusion is seen. No parenchymal nodules are noted. Upper Abdomen: Visualized upper abdomen shows no acute abnormality. Musculoskeletal: No acute bony abnormality is noted. Review of the MIP images confirms the above findings. IMPRESSION: No evidence of pulmonary emboli. Electronically Signed   By: Alcide Clever M.D.   On: 09/19/2023 20:24   DG Chest Portable 1 View  Result Date: 09/19/2023 CLINICAL DATA:  Shortness of breath, history of prior COVID infection EXAM: PORTABLE CHEST 1 VIEW COMPARISON:  08/26/2023 FINDINGS: The heart size and mediastinal contours are within normal limits. Both lungs are clear. The visualized skeletal structures  are unremarkable. IMPRESSION: No active disease. Electronically Signed   By: Alcide Clever M.D.   On: 09/19/2023 20:15    Procedures Procedures    Medications Ordered in ED Medications  ipratropium-albuterol (DUONEB) 0.5-2.5 (3) MG/3ML nebulizer solution 3 mL (3 mLs Nebulization Given 09/19/23 1841)  albuterol (PROVENTIL) (2.5 MG/3ML) 0.083% nebulizer solution 2.5 mg (2.5 mg Nebulization Given 09/19/23 1841)  iohexol (OMNIPAQUE) 350 MG/ML injection 75 mL (75 mLs Intravenous Contrast Given 09/19/23 1956)  insulin aspart (novoLOG) injection 4 Units (4 Units Subcutaneous Given 09/19/23 2241)    ED Course/ Medical Decision Making/ A&P                                 Medical Decision Making Amount and/or Complexity of Data Reviewed Labs: ordered. Radiology: ordered.  Risk Prescription drug management.   Shortness of breath coughing.  Had developed worse after COVID  previously.  Had been seen under September for the same with positive COVID test at that time but likely had reinfected before that.  Workup reassuring.  X-ray reassuring.  However with worsening shortness of breath had got CT angiography to evaluate causes such as pulmonary embolism but can be associate with COVID.  It was negative.  Did have hyperglycemia that was up to almost 400.  On recheck it however was down to 260.  Given dose of insulin.  Has insulin at home.  States she has follow-up with the diabetes coordinator's about getting on a different insulin.  Appears stable for discharge home.        Final Clinical Impression(s) / ED Diagnoses Final diagnoses:  Dyspnea, unspecified type    Rx / DC Orders ED Discharge Orders     None         Benjiman Core, MD 09/19/23 2350

## 2023-09-19 NOTE — ED Notes (Signed)
Ambulated from triage to room 4, HR 112, SpO2 93-94% on r/a.  Patient endorses mild SHOB while walking.

## 2023-10-01 ENCOUNTER — Emergency Department
Admission: EM | Admit: 2023-10-01 | Discharge: 2023-10-01 | Disposition: A | Discharge status: Home / Self Care | Payer: Medicaid Other | Attending: Emergency Medicine | Admitting: Emergency Medicine

## 2023-10-01 ENCOUNTER — Other Ambulatory Visit: Payer: Self-pay

## 2023-10-01 ENCOUNTER — Encounter: Payer: Self-pay | Admitting: Emergency Medicine

## 2023-10-01 ENCOUNTER — Emergency Department: Payer: Medicaid Other

## 2023-10-01 DIAGNOSIS — E119 Type 2 diabetes mellitus without complications: Secondary | ICD-10-CM | POA: Diagnosis not present

## 2023-10-01 DIAGNOSIS — R0602 Shortness of breath: Secondary | ICD-10-CM | POA: Diagnosis present

## 2023-10-01 DIAGNOSIS — Z20822 Contact with and (suspected) exposure to covid-19: Secondary | ICD-10-CM | POA: Insufficient documentation

## 2023-10-01 DIAGNOSIS — I10 Essential (primary) hypertension: Secondary | ICD-10-CM | POA: Diagnosis not present

## 2023-10-01 DIAGNOSIS — J45901 Unspecified asthma with (acute) exacerbation: Secondary | ICD-10-CM | POA: Insufficient documentation

## 2023-10-01 LAB — TROPONIN I (HIGH SENSITIVITY): Troponin I (High Sensitivity): 3 ng/L (ref <18)

## 2023-10-01 LAB — BASIC METABOLIC PANEL
Anion gap: 10 (ref 5–15)
BUN: 12 mg/dL (ref 6–20)
CO2: 27 mmol/L (ref 22–32)
Calcium: 9.3 mg/dL (ref 8.9–10.3)
Chloride: 101 mmol/L (ref 98–111)
Creatinine, Ser: 0.67 mg/dL (ref 0.44–1.00)
GFR, Estimated: >60 mL/min (ref >60)
Glucose, Bld: 179 mg/dL — ABNORMAL HIGH (ref 70–99)
Potassium: 3.6 mmol/L (ref 3.5–5.1)
Sodium: 138 mmol/L (ref 135–145)

## 2023-10-01 LAB — SARS CORONAVIRUS 2 BY RT PCR: SARS Coronavirus 2 by RT PCR: NEGATIVE

## 2023-10-01 LAB — CBC WITH DIFFERENTIAL/PLATELET
Abs Immature Granulocytes: 0.05 10*3/uL (ref 0.00–0.07)
Basophils Absolute: 0.1 10*3/uL (ref 0.0–0.1)
Basophils Relative: 0 %
Eosinophils Absolute: 0.5 10*3/uL (ref 0.0–0.5)
Eosinophils Relative: 4 %
HCT: 40.7 % (ref 36.0–46.0)
Hemoglobin: 13.7 g/dL (ref 12.0–15.0)
Immature Granulocytes: 0 %
Lymphocytes Relative: 30 %
Lymphs Abs: 3.8 10*3/uL (ref 0.7–4.0)
MCH: 29.3 pg (ref 26.0–34.0)
MCHC: 33.7 g/dL (ref 30.0–36.0)
MCV: 87.2 fL (ref 80.0–100.0)
Monocytes Absolute: 0.4 10*3/uL (ref 0.1–1.0)
Monocytes Relative: 3 %
Neutro Abs: 8.1 10*3/uL — ABNORMAL HIGH (ref 1.7–7.7)
Neutrophils Relative %: 63 %
Platelets: 352 10*3/uL (ref 150–400)
RBC: 4.67 MIL/uL (ref 3.87–5.11)
RDW: 13.2 % (ref 11.5–15.5)
WBC: 12.9 10*3/uL — ABNORMAL HIGH (ref 4.0–10.5)
nRBC: 0 % (ref 0.0–0.2)

## 2023-10-01 MED ORDER — IPRATROPIUM-ALBUTEROL 0.5-2.5 (3) MG/3ML IN SOLN
3.0000 mL | Freq: Once | RESPIRATORY_TRACT | Status: AC
  Administered 2023-10-01: 3 mL via RESPIRATORY_TRACT
  Filled 2023-10-01: qty 3

## 2023-10-01 MED ORDER — PREDNISONE 10 MG PO TABS: ORAL_TABLET | ORAL | 0 refills | Status: AC

## 2023-10-01 MED ORDER — PREDNISONE 20 MG PO TABS
60.0000 mg | ORAL_TABLET | Freq: Once | ORAL | Status: AC
  Administered 2023-10-01: 60 mg via ORAL
  Filled 2023-10-01: qty 3

## 2023-10-01 NOTE — ED Notes (Signed)
Patient declined discharge vital signs. 

## 2023-10-01 NOTE — ED Triage Notes (Signed)
Pt here via [facility] with a cough. Pt had covid 08/01/23. Pt was given an inhaler with no relief. 2 duonebs given by ems. Pt has an upcoming appt with pulmonology.   172/88 86 18 98% RA

## 2023-10-01 NOTE — ED Provider Notes (Signed)
[facility] Provider Note    Event Date/Time   First MD Initiated Contact with Patient 10/01/23 1631     (approximate)   History   Chief Complaint Cough and Shortness of Breath   HPI  Jessica Ponce is a 57 y.o. female with past medical history of hypertension, hyperlipidemia, diabetes, and asthma who presents to the ED complaining of shortness of breath.  Patient reports that she has been dealing with waxing and waning difficulty breathing ever since she was diagnosed with COVID-19 at the beginning of September.  Over the past few days, she has had increased dry cough along with difficulty breathing, denies any fevers or pain in her chest.  She has not had any pain or swelling in her legs, does feel like she has been wheezing but has not had any relief with albuterol at home.  She has follow-up scheduled with pulmonary next week due to these recurrent episodes.     Physical Exam   Triage Vital Signs: ED Triage Vitals [10/01/23 1335]  Encounter Vitals Group     BP (!) 141/90     Systolic BP Percentile      Diastolic BP Percentile      Pulse Rate 91     Resp 16     Temp 98.3 F (36.8 C)     Temp src      SpO2 91 %     Weight      Height      Head Circumference      Peak Flow      Pain Score      Pain Loc      Pain Education      Exclude from Growth Chart     Most recent vital signs: Vitals:   10/01/23 1335  BP: (!) 141/90  Pulse: 91  Resp: 16  Temp: 98.3 F (36.8 C)  SpO2: 91%    Constitutional: Alert and oriented. Eyes: Conjunctivae are normal. Head: Atraumatic. Nose: No congestion/rhinnorhea. Mouth/Throat: Mucous membranes are moist.  Cardiovascular: Normal rate, regular rhythm. Grossly normal heart sounds.  2+ radial pulses bilaterally. Respiratory: Normal respiratory effort.  No retractions. Lungs with expiratory wheezing throughout. Gastrointestinal: Soft and nontender. No distention. Musculoskeletal: No lower extremity  tenderness nor edema.  Neurologic:  Normal speech and language. No gross focal neurologic deficits are appreciated.    ED Results / Procedures / Treatments   Labs (all labs ordered are listed, but only abnormal results are displayed) Labs Reviewed  CBC WITH DIFFERENTIAL/PLATELET - Abnormal; Notable for the following components:      Result Value   WBC 12.9 (*)    Neutro Abs 8.1 (*)    All other components within normal limits  BASIC METABOLIC PANEL - Abnormal; Notable for the following components:   Glucose, Bld 179 (*)    All other components within normal limits  SARS CORONAVIRUS 2 BY RT PCR  TROPONIN I (HIGH SENSITIVITY)     EKG  ED ECG REPORT I, Chesley Noon, the attending physician, personally viewed and interpreted this ECG.   Date: 10/01/2023  EKG Time: 17:29  Rate: 82  Rhythm: normal sinus rhythm  Axis: Normal  Intervals:none  ST&T Change: None  RADIOLOGY Chest x-ray reviewed and interpreted by me with no infiltrate, edema, or effusion.  PROCEDURES:  Critical Care performed: No  Procedures   MEDICATIONS ORDERED IN ED: Medications  predniSONE (DELTASONE) tablet 60 mg (60 mg Oral Given 10/01/23 1807)  ipratropium-albuterol (DUONEB) 0.5-2.5 (  3) MG/3ML nebulizer solution 3 mL (3 mLs Nebulization Given 10/01/23 1810)     IMPRESSION / MDM / ASSESSMENT AND PLAN / ED COURSE  I reviewed the triage vital signs and the nursing notes.                              57 y.o. female with past medical history of hypertension, hyperlipidemia, diabetes, and asthma who presents to the ED complaining of ongoing cough with increasing difficulty breathing over the past few days.  Patient's presentation is most consistent with acute presentation with potential threat to life or bodily function.  Differential diagnosis includes, but is not limited to, ACS, PE, asthma exacerbation, CHF, COVID-19, pneumonia, anemia, electrolyte abnormality.  Patient nontoxic-appearing and  in no acute distress, vital signs are unremarkable.  She is not in any respiratory distress but does have significant expiratory wheezing on exam.  She reports relief with albuterol neb given by EMS, will give dose of prednisone and DuoNeb here in the ED.  COVID testing is negative and chest x-ray is unremarkable.  EKG and labs are pending at this time.  Low suspicion for PE as she had CTA of her chest performed less than 2 weeks ago that was normal.  Labs are reassuring with mild leukocytosis but no significant anemia, electrolyte abnormality, or AKI.  Troponin within normal limits and I doubt ACS or PE.  Wheezing resolved on reassessment and patient reports her breathing feels better, she is appropriate for outpatient management with course of steroids, has pulmonary follow-up scheduled in 5 days.  She was counseled to monitor blood glucose closely while on steroids and to return to the ED for new or worsening symptoms.  Patient agrees with plan.      FINAL CLINICAL IMPRESSION(S) / ED DIAGNOSES   Final diagnoses:  Exacerbation of persistent asthma, unspecified asthma severity     Rx / DC Orders   ED Discharge Orders          Ordered    predniSONE (DELTASONE) 10 MG tablet  Daily        10/01/23 1834             Note:  This document was prepared using Dragon voice recognition software and may include unintentional dictation errors.   Chesley Noon, MD 10/01/23 [phone]

## 2023-10-01 NOTE — ED Triage Notes (Signed)
Pt complains of cough and SOB since beginning of September. Pt states continues to cough. 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 pt states cough is getting worse and at times causes nausea and vomiting. PT has appointment with pulmonology next week but feels like she needs something to help with breathing before then. Home O2 has been reading upper 80s.

## 2023-10-04 ENCOUNTER — Ambulatory Visit: Payer: Medicaid Other | Attending: Family Medicine | Admitting: Pharmacist

## 2023-10-04 DIAGNOSIS — E119 Type 2 diabetes mellitus without complications: Secondary | ICD-10-CM

## 2023-10-04 DIAGNOSIS — Z7984 Long term (current) use of oral hypoglycemic drugs: Secondary | ICD-10-CM | POA: Diagnosis not present

## 2023-10-04 DIAGNOSIS — E782 Mixed hyperlipidemia: Secondary | ICD-10-CM | POA: Diagnosis not present

## 2023-10-04 MED ORDER — LANTUS SOLOSTAR 100 UNIT/ML ~~LOC~~ SOPN: 30.0000 [IU] | PEN_INJECTOR | Freq: Every day | SUBCUTANEOUS | 1 refills | Status: DC

## 2023-10-04 MED ORDER — DEXCOM G7 RECEIVER DEVI: 0 refills | Status: DC

## 2023-10-04 MED ORDER — DEXCOM G7 SENSOR MISC: 6 refills | Status: DC

## 2023-10-04 NOTE — Progress Notes (Unsigned)
S:     No chief complaint on file.  57 y.o. female who presents for diabetes evaluation, education, and management.   Patient was referred and last seen by Primary Care Provider, Dr. Andrey Campanile, on 09/17/23.   PMH is significant for T2DM, stroke, asthma, long COVID sx, hyperlipidemia.   At last visit with Dr. Andrey Campanile, A1c was 10.4% and she was referred to pharmacy for medication management. Patient endorsed difficulty managing blood sugars with recent steroid use for her asthma/persistent dyspnea. Of note, last lipid panel on file from June 2023: TC 343, TG 281, HDL 50, LDL 237.  Patient presents in good spirits and without assistance. Reports adherence to medications as prescribed. She notes that since starting prednisone on 11/1 BG has been much improved to the 100s. Prior to this she reported BG was 260-300s consistently. Has had increased stress lately. Reports that her father-in-law recently passed away and she is now caring more for her mother-in-law who cannot drive herself. Also notes that her daughter recently told her she is transgender and would like to transition which has caused stress. Also notes she has an appt with pulmonology for her long COVID sx in a couple days. Has Dexcom G6, but would prefer to use G7 so she does not have to use separate transmitter/sensors devices. Discussed elevated LDL with her today. She reports high cholesterol runs in her family, but it "has never caused any issues". Noted that her 79 year old daughter's doctor talked about how she has high cholesterol as well. Patient reports that she does her own research and does not think cholesterol medications are right for her. Aunt with high cholesterol as well, but notes it has never caused any blockages. Reports that statins caused increased blood sugar in the past. Patient explained that she does have a documented history of stroke, but doctors told her this may have been a complicated migraine.  Family/Social  History:  Father- stroke Mother- breast cancer, memory loss  Current diabetes medications include: Humalog 75/25 - 20 units twice a day with meals Previously tried diabetes medications include: Ozempic (vomiting), metformin (diarrhea) Current hypertension medications include: losartan 100 mg daily, amlodipine 10 mg daily Current hyperlipidemia medications include: none Previously tried hyperlipidemia medications include: atorvastatin and rosuvastatin (patient self discontinued due to elevated blood sugar)  Patient reports adherence to taking all medications as prescribed.   Do you feel that your medications are working for you? yes Have you been experiencing any side effects to the medications prescribed? no Do you have any problems obtaining medications due to transportation or finances? no Insurance coverage: [place] Medicaid  Patient reports hypoglycemic events. Today had BG in 70s and felt shakey. Sx resolved when she had something to eat.  Reported home blood sugars: BG in 100s since starting prednisone on 11/1, prior to that BG was 260-300.  Patient denies nocturia (nighttime urination). Patient endorse polyphagia when her BG is high. Patient reports neuropathy (nerve pain). Patient reports visual changes, but does wear glasses and follows with eye doctor. Notes that her vision changed with stroke. Patient reports self foot exams.   Patient reported dietary habits: Eats 2 meals/day Breakfast: coffee Lunch: eggs, sausage, cheese Dinner: chicken thighs, tries to stay away from bread Snacks: does admit to snacking at night, cookies, trail mix more lately since she has had increased stress Drinks: water, sometimes puts flavoring in water which is low carb, very little soda   O:   ROS  Physical Exam  BG in clinic  on Dexcom app was 106  Lab Results  Component Value Date   HGBA1C 10.4 (A) 09/17/2023   There were no vitals filed for this visit.  Lipid Panel     Component  Value Date/Time   CHOL 343 (H) 05/28/2022 0522   CHOL 278 (H) 06/26/2021 0921   TRIG 281 (H) 05/28/2022 0522   HDL 50 05/28/2022 0522   HDL 58 06/26/2021 0921   CHOLHDL 6.9 05/28/2022 0522   VLDL 56 (H) 05/28/2022 0522   LDLCALC 237 (H) 05/28/2022 0522   LDLCALC 186 (H) 06/26/2021 0921    Clinical Atherosclerotic Cardiovascular Disease (ASCVD): Yes  The ASCVD Risk score (Arnett DK, et al., 2019) failed to calculate for the following reasons:   The patient has a prior MI or stroke diagnosis   Patient is participating in a Managed Medicaid Plan:  No  A/P: Diabetes longstanding currently uncontrolled based on last A1c 10.4%. Difficult to assess BG control given lack of access to Dexcom reports. Will switch to G7 today so we are able to see reports and per patient request. Patient is able to verbalize appropriate hypoglycemia management plan. Medication adherence appears appropriate. Control is suboptimal due to need for medication optimization. Unclear why BG has actually improved since starting on prednisone and discussed that steroids usually raise blood sugar. This may be due to changes in diet. Will switch her from combination insulin to basal insulin to lower risk of hypoglycemia, provide better glycemic control, and allow for easier insulin adjustments. Counseled patient to reach out to clinic if she experiences hypoglycemia or sustained hyperglycemia especially when she completes prednisone on 11/12 as this will likely affect her BG. -Discontinued Humalog 75/25 -Start Lantus 30 units daily -Patient educated on purpose, proper use, and potential adverse effects of Lantus.  -Extensively discussed pathophysiology of diabetes, recommended lifestyle interventions, dietary effects on blood sugar control.  -Counseled on s/sx of and management of hypoglycemia.  -Next A1c anticipated 12/18/23.   ASCVD risk - secondary prevention in patient with diabetes. Last LDL is 237 not at goal of <62 mg/dL.  Patient with hx of stroke (although appears this could have been a complicated migraine) does have risk factors including T2DM, HTN, HLD. High intensity statin indicated. Discussed elevated LDL with patient and she was hesitant to take cholesterol medications as these caused increased blood sugar in the past and she just doesn't think these medications are right for her from her own research. Since her last lipid panel was from over 1 year ago, she agreed to get updated labs today and discuss further next visit. Will also refer her to cardiology given significantly elevated LDL of 237 with family hx of high cholesterol, concern for familial hypercholesterolemia.  -F/u - updated lipid panel today  Written patient instructions provided. Patient verbalized understanding of treatment plan.  Total time in face to face counseling 30 minutes.    Follow-up:  Pharmacist in 1 month PCP clinic visit in 12/21/23   Jarrett Ables, PharmD PGY-1 Pharmacy Resident

## 2023-10-05 ENCOUNTER — Encounter: Payer: Self-pay | Admitting: Pharmacist

## 2023-10-05 ENCOUNTER — Other Ambulatory Visit: Payer: Self-pay | Admitting: Family Medicine

## 2023-10-05 LAB — LIPID PANEL
Chol/HDL Ratio: 4 ratio (ref 0.0–4.4)
Cholesterol, Total: 267 mg/dL — ABNORMAL HIGH (ref 100–199)
HDL: 66 mg/dL (ref >39)
LDL Chol Calc (NIH): 153 mg/dL — ABNORMAL HIGH (ref 0–99)
Triglycerides: 266 mg/dL — ABNORMAL HIGH (ref 0–149)
VLDL Cholesterol Cal: 48 mg/dL — ABNORMAL HIGH (ref 5–40)

## 2023-10-06 ENCOUNTER — Encounter: Payer: Self-pay | Admitting: Pulmonary Disease

## 2023-10-06 ENCOUNTER — Other Ambulatory Visit: Payer: Self-pay | Admitting: Family Medicine

## 2023-10-06 ENCOUNTER — Ambulatory Visit: Payer: Medicaid Other | Admitting: Pulmonary Disease

## 2023-10-06 VITALS — BP 130/86 | HR 82 | Temp 97.6°F | Ht 65.0 in | Wt 213.0 lb

## 2023-10-06 DIAGNOSIS — J4540 Moderate persistent asthma, uncomplicated: Secondary | ICD-10-CM | POA: Diagnosis not present

## 2023-10-06 DIAGNOSIS — R0602 Shortness of breath: Secondary | ICD-10-CM | POA: Diagnosis not present

## 2023-10-06 LAB — NITRIC OXIDE: Nitric Oxide: 53

## 2023-10-06 MED ORDER — SYMBICORT 160-4.5 MCG/ACT IN AERO: 2.0000 | INHALATION_SPRAY | Freq: Two times a day (BID) | RESPIRATORY_TRACT | 12 refills | Status: AC

## 2023-10-06 NOTE — Progress Notes (Signed)
Synopsis: Referred in by Georganna Skeans, MD   Subjective:   PATIENT ID: Jessica Ponce GENDER: female DOB: 04-Jul-1966, MRN: [id number]  Chief Complaint  Patient presents with   Consult    Cough, shortness of breath on exertion and occasional at rest and wheezing.     HPI Jessica Ponce is a pleasant 57 year old female patient with a past medical history of mild intermittent asthma, hypertension, depression on Wellbutrin presenting to the pulmonary clinic for further evaluation of her asthma.  She said she contracted COVID in end of August September she started having worsening symptoms.  Described as chest tightness and inability to take in deep breath.  Also reports dry cough persistent throughout the day.  She was started on prednisone taper by her primary care physician.  She also been using albuterol nebulizer and albuterol inhaler as needed on a daily basis.  She was diagnosed with asthma as a child, before that episode has been only on albuterol inhaler as needed.  She has never been hospitalized or intubated for asthma.  Her triggers are mostly viral infections but also she is somewhat sensitive to strong scents and high humidity.  Family history - she denies any family history of asthma  Social history - never smoker denies alcohol use and denies illicit drug use.  Works as a Lawyer  ROS All systems were reviewed and are negative except for the above. Objective:   Vitals:   10/06/23 1035  BP: 130/86  Pulse: 82  Temp: 97.6 F (36.4 C)  TempSrc: Temporal  SpO2: 95%  Weight: 213 lb (96.6 kg)  Height: 5\' 5"  (1.651 m)   95% on RA BMI Readings from Last 3 Encounters:  10/06/23 35.45 kg/m  09/17/23 35.18 kg/m  08/26/23 35.78 kg/m   Wt Readings from Last 3 Encounters:  10/06/23 213 lb (96.6 kg)  09/17/23 211 lb 6.4 oz (95.9 kg)  08/26/23 215 lb (97.5 kg)    Physical Exam GEN: NAD, Healthy Appearing HEENT: Supple Neck, Reactive Pupils, EOMI  CVS: Normal S1,  Normal S2, RRR, No murmurs or ES appreciated  Lungs: Clear bilateral air entry.  Abdomen: Soft, non tender, non distended, + BS  Extremities: Warm and well perfused, No edema  Skin: No suspicious lesions appreciated  Psych: Normal Affect  Labs and imaging were reviewed. Ancillary Information   CBC    Component Value Date/Time   WBC 12.9 (H) 10/01/2023 1723   RBC 4.67 10/01/2023 1723   HGB 13.7 10/01/2023 1723   HGB 13.6 06/26/2021 0921   HCT 40.7 10/01/2023 1723   HCT 40.8 06/26/2021 0921   PLT 352 10/01/2023 1723   PLT 328 06/26/2021 0921   MCV 87.2 10/01/2023 1723   MCV 88 06/26/2021 0921   MCH 29.3 10/01/2023 1723   MCHC 33.7 10/01/2023 1723   RDW 13.2 10/01/2023 1723   RDW 13.0 06/26/2021 0921   LYMPHSABS 3.8 10/01/2023 1723   LYMPHSABS 3.3 (H) 06/26/2021 0921   MONOABS 0.4 10/01/2023 1723   EOSABS 0.5 10/01/2023 1723   EOSABS 0.3 06/26/2021 0921   BASOSABS 0.1 10/01/2023 1723   BASOSABS 0.1 06/26/2021 0921   Labs and imaging were reviewed.      No data to display          Assessment & Plan:  Jessica Ponce is a pleasant 57 year old female patient with a past medical history of mild intermittent asthma, hypertension, depression on Wellbutrin presenting to the pulmonary clinic for further evaluation of her asthma.  #Severe persistent  asthma  EOS 500 FENO 54   []  Start Budesonide-Formoterol [Symbicort] 160-4.5 2 puffs BID. Advised mouth rinse post each use.  []  Finishing steroid taper on Tuesday.  []  C/w Albuterol 2puffs Q6H as needed  []  Obtain Antigen panel and total IgE.  []  Consider Biologics if symptoms do not resolve on next visit.   Return in about 4 weeks (around 11/03/2023).  I spent 60 minutes caring for this patient today, including preparing to see the patient, obtaining a medical history , reviewing a separately obtained history, performing a medically appropriate examination and/or evaluation, counseling and educating the patient/family/caregiver,  ordering medications, tests, or procedures, documenting clinical information in the electronic health record, and independently interpreting results (not separately reported/billed) and communicating results to the patient/family/caregiver  Janann Colonel, MD [facility] 10/06/2023 11:46 AM

## 2023-10-06 NOTE — Telephone Encounter (Signed)
Sorry I just saw you live in [place]. You can go to a [facility] draw station if you would like. I can give you a list if you need one. Just let me know where you choose to get them done so I can send the order if you [facility], CMA

## 2023-10-06 NOTE — Telephone Encounter (Signed)
Hi Mrs. Talaga, you can go to [facility] entrance, when you have a chance and have the labs done.  Just stop at the registration desk and tell them you are there to have labs done. The order has already been placed in the system.  Foye Clock, CMA

## 2023-10-27 ENCOUNTER — Other Ambulatory Visit: Payer: Self-pay | Admitting: Family Medicine

## 2023-10-27 ENCOUNTER — Telehealth: Payer: Self-pay | Admitting: Pulmonary Disease

## 2023-10-27 DIAGNOSIS — E119 Type 2 diabetes mellitus without complications: Secondary | ICD-10-CM

## 2023-10-27 NOTE — Telephone Encounter (Signed)
Returning missed call

## 2023-10-27 NOTE — Telephone Encounter (Signed)
Patient returning call for PFT appt.

## 2023-10-27 NOTE — Telephone Encounter (Signed)
I have spoke with the patient and her PFT has been scheduled on 11/02/23 @ 1:00pm at [facility] Entrance

## 2023-11-02 ENCOUNTER — Ambulatory Visit: Payer: Medicaid Other | Attending: Pulmonary Disease

## 2023-11-02 DIAGNOSIS — J4540 Moderate persistent asthma, uncomplicated: Secondary | ICD-10-CM | POA: Diagnosis present

## 2023-11-02 MED ORDER — ALBUTEROL SULFATE (2.5 MG/3ML) 0.083% IN NEBU
2.5000 mg | INHALATION_SOLUTION | Freq: Once | RESPIRATORY_TRACT | Status: AC
  Administered 2023-11-02: 2.5 mg via RESPIRATORY_TRACT
  Filled 2023-11-02: qty 3

## 2023-11-03 NOTE — Progress Notes (Unsigned)
Synopsis: Referred in by Georganna Skeans, MD   Subjective:   PATIENT ID: Jessica Ponce GENDER: female DOB: [birth year], MRN: [id number]  No chief complaint on file.   HPI Jessica Ponce is a pleasant 56 year old female patient with a past medical history of mild intermittent asthma, hypertension, depression on Wellbutrin presenting to the pulmonary clinic for further evaluation of her asthma.  She said she contracted COVID in end of August September she started having worsening symptoms.  Described as chest tightness and inability to take in deep breath.  Also reports dry cough persistent throughout the day.  She was started on prednisone taper by her primary care physician. She also been using albuterol nebulizer and albuterol inhaler as needed on a daily basis.  Completed steroid taper. Feeling better but not fully back to baseline. Not using her rescue inhaler.   She was diagnosed with asthma as a child, before that episode has been only on albuterol inhaler as needed.  She has never been hospitalized or intubated for asthma.  Her triggers are mostly viral infections but also she is somewhat sensitive to strong scents and high humidity.  Family history - she denies any family history of asthma  Social history - never smoker denies alcohol use and denies illicit drug use.  Works as a Lawyer  ROS All systems were reviewed and are negative except for the above. Objective:   There were no vitals filed for this visit.    on RA BMI Readings from Last 3 Encounters:  10/06/23 35.45 kg/m  09/17/23 35.18 kg/m  08/26/23 35.78 kg/m   Wt Readings from Last 3 Encounters:  10/06/23 213 lb (96.6 kg)  09/17/23 211 lb 6.4 oz (95.9 kg)  08/26/23 215 lb (97.5 kg)    Physical Exam GEN: NAD, Healthy Appearing HEENT: Supple Neck, Reactive Pupils, EOMI  CVS: Normal S1, Normal S2, RRR, No murmurs or ES appreciated  Lungs: Clear bilateral air entry.  Abdomen: Soft, non tender, non  distended, + BS  Extremities: Warm and well perfused, No edema  Skin: No suspicious lesions appreciated  Psych: Normal Affect  Labs and imaging were reviewed.  Ancillary Information   CBC    Component Value Date/Time   WBC 12.9 (H) 10/01/2023 1723   RBC 4.67 10/01/2023 1723   HGB 13.7 10/01/2023 1723   HGB 13.6 06/26/2021 0921   HCT 40.7 10/01/2023 1723   HCT 40.8 06/26/2021 0921   PLT 352 10/01/2023 1723   PLT 328 06/26/2021 0921   MCV 87.2 10/01/2023 1723   MCV 88 06/26/2021 0921   MCH 29.3 10/01/2023 1723   MCHC 33.7 10/01/2023 1723   RDW 13.2 10/01/2023 1723   RDW 13.0 06/26/2021 0921   LYMPHSABS 3.8 10/01/2023 1723   LYMPHSABS 3.3 (H) 06/26/2021 0921   MONOABS 0.4 10/01/2023 1723   EOSABS 0.5 10/01/2023 1723   EOSABS 0.3 06/26/2021 0921   BASOSABS 0.1 10/01/2023 1723   BASOSABS 0.1 06/26/2021 0921   Labs and imaging were reviewed.      No data to display          Assessment & Plan:  Jessica Ponce is a pleasant 57 year old female patient with a past medical history of mild intermittent asthma, hypertension, depression on Wellbutrin presenting to the pulmonary clinic for further evaluation of her asthma.  #Severe persistent asthma  EOS 500 FENO 54 --> 41 12/5  ACT 12/05 - 16.   Exacerbated by a viral infection. No eczema or allergic rhinitis. No hypersensitivites to cold  air or humidity.   []  C/w Budesonide-Formoterol [Symbicort] 160-4.5 2 puffs BID. Advised mouth rinse post each use.  []  C/w Albuterol 2puffs Q6H as needed  []  Obtain Antigen panel and total IgE.  []  Referred to [facility] asthma/allergy to start Dupixent    #OSA on CPAP #GERD on pantoprazole 40mg  PO daily  No follow-ups on file.  I spent 35 minutes caring for this patient today, including preparing to see the patient, obtaining a medical history , reviewing a separately obtained history, performing a medically appropriate examination and/or evaluation, counseling and educating the  patient/family/caregiver, ordering medications, tests, or procedures, documenting clinical information in the electronic health record, and independently interpreting results (not separately reported/billed) and communicating results to the patient/family/caregiver  Janann Colonel, MD [facility] 11/03/2023 9:21 PM

## 2023-11-04 ENCOUNTER — Ambulatory Visit: Payer: Medicaid Other | Admitting: Pulmonary Disease

## 2023-11-04 ENCOUNTER — Ambulatory Visit: Payer: Self-pay | Admitting: Pharmacist

## 2023-11-04 ENCOUNTER — Telehealth: Payer: Self-pay

## 2023-11-04 ENCOUNTER — Other Ambulatory Visit
Admission: RE | Admit: 2023-11-04 | Discharge: 2023-11-04 | Disposition: A | Discharge status: Home / Self Care | Payer: Medicaid Other | Source: Ambulatory Visit | Attending: Pulmonary Disease | Admitting: Pulmonary Disease

## 2023-11-04 ENCOUNTER — Encounter: Payer: Self-pay | Admitting: Pulmonary Disease

## 2023-11-04 ENCOUNTER — Encounter (INDEPENDENT_AMBULATORY_CARE_PROVIDER_SITE_OTHER): Payer: Self-pay

## 2023-11-04 VITALS — BP 116/76 | HR 82 | Temp 97.7°F | Resp 97 | Ht 65.0 in | Wt 219.2 lb

## 2023-11-04 DIAGNOSIS — J4540 Moderate persistent asthma, uncomplicated: Secondary | ICD-10-CM | POA: Diagnosis present

## 2023-11-04 DIAGNOSIS — R0602 Shortness of breath: Secondary | ICD-10-CM | POA: Diagnosis not present

## 2023-11-04 DIAGNOSIS — G4733 Obstructive sleep apnea (adult) (pediatric): Secondary | ICD-10-CM

## 2023-11-04 LAB — NITRIC OXIDE: Nitric Oxide: 41

## 2023-11-04 NOTE — Telephone Encounter (Signed)
Forms re-faxed to [phone].   Thank you,

## 2023-11-04 NOTE — Telephone Encounter (Signed)
Patient seen in office today. Patient signed formes for Dupixent. Forms faxed to pharmacy team for completion.

## 2023-11-04 NOTE — Addendum Note (Signed)
Addended by: Yehuda Savannah on: 11/04/2023 09:47 AM   Modules accepted: Orders

## 2023-11-05 ENCOUNTER — Telehealth: Payer: Self-pay

## 2023-11-05 NOTE — Telephone Encounter (Signed)
Received New start paperwork for DUPIXENT. Initiated a Prior Authorization request to [employer] via CoverMyMeds, however pt has Medicaid and plan requires documented evidence stating that pt continues to have inadequate control of asthma symptoms after a minimum of 3 months on a long-acting beta agonist inhaler therapy. Pt was started on maintenance inhaler for the first time yesterday (11/04/23).   PA denials through Medicaid persist for 6 months, therefore--in the interest of initiating therapy as soon as possible--we are unable to proceed with investigating coverage for Dupixent at this time . Pt's condition will need to be reevaluated after a minimum of 3 months and documentation MUST be provided that pt's asthma is still inadequately controlled despite compliance with inhaler regimen.

## 2023-11-07 LAB — ALLERGEN PANEL (27) + IGE
Alternaria Alternata IgE: <0.1 kU/L
Aspergillus Fumigatus IgE: <0.1 kU/L
Bahia Grass IgE: <0.1 kU/L
Bermuda Grass IgE: <0.1 kU/L
Cat Dander IgE: <0.1 kU/L
Cedar, Mountain IgE: <0.1 kU/L
Cladosporium Herbarum IgE: <0.1 kU/L
Cocklebur IgE: <0.1 kU/L
Cockroach, American IgE: <0.1 kU/L
Common Silver Birch IgE: <0.1 kU/L
D Farinae IgE: <0.1 kU/L
D Pteronyssinus IgE: <0.1 kU/L
Dog Dander IgE: <0.1 kU/L
Elm, American IgE: <0.1 kU/L
Hickory, White IgE: <0.1 kU/L
IgE (Immunoglobulin E), Serum: 6 [IU]/mL (ref 6–495)
Johnson Grass IgE: <0.1 kU/L
Kentucky Bluegrass IgE: <0.1 kU/L
Maple/Box Elder IgE: <0.1 kU/L
Mucor Racemosus IgE: <0.1 kU/L
Oak, White IgE: <0.1 kU/L
Penicillium Chrysogen IgE: <0.1 kU/L
Pigweed, Rough IgE: <0.1 kU/L
Plantain, English IgE: <0.1 kU/L
Ragweed, Short IgE: <0.1 kU/L
Setomelanomma Rostrat: <0.1 kU/L
Timothy Grass IgE: <0.1 kU/L
White Mulberry IgE: <0.1 kU/L

## 2023-11-09 ENCOUNTER — Ambulatory Visit: Payer: Medicaid Other | Attending: Family Medicine | Admitting: Pharmacist

## 2023-11-09 DIAGNOSIS — E1165 Type 2 diabetes mellitus with hyperglycemia: Secondary | ICD-10-CM

## 2023-11-09 DIAGNOSIS — E119 Type 2 diabetes mellitus without complications: Secondary | ICD-10-CM

## 2023-11-09 DIAGNOSIS — Z794 Long term (current) use of insulin: Secondary | ICD-10-CM | POA: Diagnosis not present

## 2023-11-09 MED ORDER — LANTUS SOLOSTAR 100 UNIT/ML ~~LOC~~ SOPN: 40.0000 [IU] | PEN_INJECTOR | Freq: Every day | SUBCUTANEOUS | 1 refills | Status: DC

## 2023-11-09 NOTE — Progress Notes (Signed)
S:     No chief complaint on file.  57 y.o. female who presents for diabetes evaluation, education, and management.   Patient was referred and last seen by Primary Care Provider, Dr. Andrey Campanile, on 09/17/23. We saw her on 10/04/23 and changed her Humalog mix to Lantus.  PMH is significant for T2DM, stroke, asthma, long COVID sx, hyperlipidemia.   Today, patient presents in good spirits and without assistance. Was diagnosed as GDM ~57 YO. Was managed initially with insulin and then insulin was stopped when she delivered her son. She was tried on metformin after that. She has also tried Ozempic in the past. Was then placed on insulin again. Since being changed to Lantus, her sugars at home remain elevated.   Family/Social History:  Father- stroke Mother- breast cancer, memory loss  Current diabetes medications include: Lantus 30u daily  Current hypertension medications include: losartan 100 mg daily, amlodipine 10 mg daily Current hyperlipidemia medications include: none Previously tried hyperlipidemia medications include: atorvastatin and rosuvastatin (patient self discontinued due to elevated blood sugar)  Patient reports adherence to taking all medications as prescribed.   Insurance coverage: [place] Medicaid  Patient denies hypoglycemic events.   Patient denies nocturia (nighttime urination). Patient endorse polyphagia when her BG is high. Patient reports neuropathy (nerve pain). Patient reports visual changes, but does wear glasses and follows with eye doctor. Notes that her vision changed with stroke. Patient reports self foot exams.   Patient reported dietary habits: Eats 2 meals/day Breakfast: coffee Lunch: eggs, sausage, cheese Dinner: chicken thighs, tries to stay away from bread Snacks: does admit to snacking at night, cookies, trail mix more lately since she has had increased stress Drinks: water, sometimes puts flavoring in water which is low carb, very little  soda   O:  Date of Download: 30 day download, 11/09/2023 Average Glucose: 251 mg/dL Glucose Management Indicator: 9.3%  Time in Goal:  - Time in range 70-180: 11% - Time above range: 41% - Time below range: 48%  Lab Results  Component Value Date   HGBA1C 10.4 (A) 09/17/2023   There were no vitals filed for this visit.  Lipid Panel     Component Value Date/Time   CHOL 267 (H) 10/04/2023 1536   TRIG 266 (H) 10/04/2023 1536   HDL 66 10/04/2023 1536   CHOLHDL 4.0 10/04/2023 1536   CHOLHDL 6.9 05/28/2022 0522   VLDL 56 (H) 05/28/2022 0522   LDLCALC 153 (H) 10/04/2023 1536    Clinical Atherosclerotic Cardiovascular Disease (ASCVD): Yes  The ASCVD Risk score (Arnett DK, et al., 2019) failed to calculate for the following reasons:   The patient has a prior MI or stroke diagnosis   Patient is participating in a Managed Medicaid Plan:  No  A/P: Diabetes longstanding currently uncontrolled based on last A1c 10.4%. Clarity reports reveal increased CBG readings at home. We will titrate basal insulin to ~0.5u/kg to give her a chance to control fasting glucose levels without having to add meal time bolus insulin doses. Patient is able to verbalize appropriate hypoglycemia management plan. Medication adherence appears appropriate. -Increase Lantus to 40 units daily. Increase to 46u daily in 1 week if home CBG readings are >130 fasting.  -Patient educated on purpose, proper use, and potential adverse effects of Lantus.  -Extensively discussed pathophysiology of diabetes, recommended lifestyle interventions, dietary effects on blood sugar control.  -Counseled on s/sx of and management of hypoglycemia.  -Next A1c anticipated 12/18/23.   Written patient instructions provided. Patient  verbalized understanding of treatment plan.  Total time in face to face counseling 30 minutes.    Follow-up:  Pharmacist in 1 month  Butch Penny, PharmD, [place], CPP Clinical Pharmacist [facility] [phone]

## 2023-11-12 ENCOUNTER — Other Ambulatory Visit: Payer: Self-pay | Admitting: Family Medicine

## 2023-11-12 DIAGNOSIS — I10 Essential (primary) hypertension: Secondary | ICD-10-CM

## 2023-11-15 NOTE — Telephone Encounter (Signed)
Requested Prescriptions  Pending Prescriptions Disp Refills   losartan (COZAAR) 100 MG tablet [Pharmacy Med Name: LOSARTAN POTASSIUM 100 MG TAB] 90 tablet 1    Sig: TAKE 1 TABLET BY MOUTH EVERY DAY     Cardiovascular:  Angiotensin Receptor Blockers Passed - 11/15/2023  8:37 AM      Passed - Cr in normal range and within 180 days    Creatinine, Ser  Date Value Ref Range Status  10/01/2023 0.67 0.44 - 1.00 mg/dL Final   Creatinine, Urine  Date Value Ref Range Status  09/17/2007 103.7  Final         Passed - K in normal range and within 180 days    Potassium  Date Value Ref Range Status  10/01/2023 3.6 3.5 - 5.1 mmol/L Final         Passed - Patient is not pregnant      Passed - Last BP in normal range    BP Readings from Last 1 Encounters:  11/04/23 116/76         Passed - Valid encounter within last 6 months    Recent Outpatient Visits           6 days ago Type 2 diabetes mellitus with hyperglycemia, with long-term current use of insulin (HCC)   [facility] [facility] [place] [facility] [place]. [facility] Lois Huxley, [place] L, RPH-CPP   1 month ago Type 2 diabetes mellitus without complication, without long-term current use of insulin (HCC)   [place] [facility] [place]. [facility] Yehuda Savannah L, RPH-CPP   1 month ago Type 2 diabetes mellitus with hyperglycemia, with long-term current use of insulin [facility])   [place] [facility], Lauris Poag, MD   6 months ago Type 2 diabetes mellitus with hyperglycemia, with long-term current use of insulin [facility])   [place] [facility], MD   1 year ago Hypertension, essential   [place] [facility], MD       Future Appointments             In 2 weeks O'Neal, Ronnald Ramp, MD [facility] at [facility]   In 3 weeks Lois Huxley, Cornelius Moras, RPH-CPP [place]  [facility] [place] - [facility] Of [place]. [facility]   In 1 month Georganna Skeans, MD [facility] at [facility]             amLODipine (NORVASC) 10 MG tablet [Pharmacy Med Name: AMLODIPINE BESYLATE 10 MG TAB] 90 tablet 1    Sig: TAKE 1 TABLET BY MOUTH EVERY DAY     Cardiovascular: Calcium Channel Blockers 2 Passed - 11/15/2023  8:37 AM      Passed - Last BP in normal range    BP Readings from Last 1 Encounters:  11/04/23 116/76         Passed - Last Heart Rate in normal range    Pulse Readings from Last 1 Encounters:  11/04/23 82         Passed - Valid encounter within last 6 months    Recent Outpatient Visits           6 days ago Type 2 diabetes mellitus with hyperglycemia, with long-term current use of insulin (HCC)   [place] [facility] [place] [facility] [place]  [facility] Lois Huxley, [place] L, RPH-CPP   1 month ago Type 2 diabetes mellitus without complication, without long-term current use of insulin (HCC)   [place] [facility] - [facility] Of [place]. [facility] Yehuda Savannah L, RPH-CPP   1 month ago Type 2 diabetes mellitus with hyperglycemia, with long-term current use of insulin [facility])   [place] [facility], Lauris Poag, MD   6 months ago Type 2 diabetes mellitus with hyperglycemia, with long-term current use of insulin [facility])   [place] [facility], MD   1 year ago Hypertension, essential   [place] [facility], MD       Future Appointments             In 2 weeks O'Neal, Ronnald Ramp, MD [facility] at [facility]   In 3 weeks Lois Huxley, Cornelius Moras, RPH-CPP  [facility] [place] - [facility] Dept Of [place]. [facility]   In 1 month Georganna Skeans, MD [facility] at [facility]

## 2023-11-16 LAB — PULMONARY FUNCTION TEST ARMC ONLY
DL/VA % pred: 100 %
DL/VA: 4.23 ml/min/mmHg/L
DLCO unc % pred: 81 %
DLCO unc: 17.23 ml/min/mmHg
FEF 25-75 Post: 2.03 L/s
FEF 25-75 Pre: 1.57 L/s
FEF2575-%Change-Post: 29 %
FEF2575-%Pred-Post: 81 %
FEF2575-%Pred-Pre: 63 %
FEV1-%Change-Post: 6 %
FEV1-%Pred-Post: 71 %
FEV1-%Pred-Pre: 67 %
FEV1-Post: 1.93 L
FEV1-Pre: 1.82 L
FEV1FVC-%Change-Post: 1 %
FEV1FVC-%Pred-Pre: 99 %
FEV6-%Change-Post: 4 %
FEV6-%Pred-Post: 72 %
FEV6-%Pred-Pre: 69 %
FEV6-Post: 2.43 L
FEV6-Pre: 2.32 L
FEV6FVC-%Pred-Post: 103 %
FEV6FVC-%Pred-Pre: 103 %
FVC-%Change-Post: 4 %
FVC-%Pred-Post: 69 %
FVC-%Pred-Pre: 66 %
FVC-Post: 2.43 L
FVC-Pre: 2.32 L
Post FEV1/FVC ratio: 79 %
Post FEV6/FVC ratio: 100 %
Pre FEV1/FVC ratio: 78 %
Pre FEV6/FVC Ratio: 100 %
RV % pred: 117 %
RV: 2.35 L
TLC % pred: 90 %
TLC: 4.71 L

## 2023-11-21 ENCOUNTER — Other Ambulatory Visit: Payer: Self-pay | Admitting: Family Medicine

## 2023-11-21 DIAGNOSIS — E119 Type 2 diabetes mellitus without complications: Secondary | ICD-10-CM

## 2023-11-26 ENCOUNTER — Ambulatory Visit: Payer: Medicaid Other | Admitting: Family Medicine

## 2023-11-28 NOTE — Progress Notes (Signed)
 Cardiology Office Note:  .   Date:  11/30/2023  ID:  Jessica Ponce, DOB [birth year], MRN [id number] PCP: Tanda Bleacher, MD  [facility] Providers Cardiologist:  None { History of Present Illness: .   Jessica Ponce is a 57 y.o. female with history of DM, HTN, HLD, CVA who presents for the evaluation of HLD at the request of Tanda Bleacher, MD.  CT 09/19/2023 no CAC EKG: NSR 82 bpm, no acute changes   History of Present Illness   Jessica Ponce, a 57 year old with a history of diabetes, hypertension, and hyperlipidemia, presents for evaluation of hyperlipidemia. The patient's most recent LDL cholesterol level was 846, and she reports intolerance to statin medications, which she believes raise her blood sugar levels. The patient is also working on managing her diabetes, with a recent A1c level above 10.  Two years ago, the patient experienced a stroke. Since then, she has had episodes of facial numbness and left-sided weakness, which have been attributed to complex migraines. The patient also reports occasional chest discomfort, described as dull or achy, which lasts for about a day. The cause of this discomfort is unclear.  The patient has a history of COVID-19, which has resulted in long-term respiratory symptoms, including tightness in the chest and wheezing. These symptoms have improved but have not returned to her pre-COVID state. The patient has been diagnosed with asthma and is currently under the care of a pulmonologist.  The patient has a family history of stroke and Alzheimer's disease but no known family history of heart disease. She does not smoke and consumes alcohol occasionally. 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          Problem List DM -A1c 10.4 HLD -T chol 267, HDL 66, LDL 153, TG 266 HTN Asthma  CVA SVT -Dx 2022 -> adenosine     ROS: All other ROS reviewed and  negative. Pertinent positives noted in the HPI.     Studies Reviewed: SABRA        TTE 05/28/2022  1. Left ventricular ejection fraction, by estimation, is 55 to 60%. The  left ventricle has normal function. The left ventricle has no regional  wall motion abnormalities. Left ventricular diastolic parameters are  consistent with Grade I diastolic  dysfunction (impaired relaxation).   2. Right ventricular systolic function is normal. The right ventricular  size is normal. Tricuspid regurgitation signal is inadequate for assessing  PA pressure.   3. The mitral valve is normal in structure. Trivial mitral valve  regurgitation. No evidence of mitral stenosis.   4. The aortic valve is tricuspid. Aortic valve regurgitation is not  visualized. Aortic valve sclerosis is present, with no evidence of aortic  valve stenosis. Aortic valve area, by VTI measures 2.72 cm. Aortic valve  mean gradient measures 4.0 mmHg.  Aortic valve Vmax measures 1.16 m/s.   5. Aortic dilatation noted. There is borderline dilatation of the  ascending aorta, measuring 37 mm.   6. The inferior vena cava is normal in size with greater than 50%  respiratory variability, suggesting right atrial pressure of 3 mmHg.  Physical Exam:   VS:  BP 112/62 (BP Location: Left Arm, Patient Position: Sitting, Cuff Size: Large)   Pulse 95   Ht 5' 4 (1.626 m)   Wt 223 lb 9.6 oz (101.4 kg)   LMP 12/10/2015 (LMP Unknown)   SpO2 96%   BMI 38.38  kg/m    Wt Readings from Last 3 Encounters:  11/30/23 223 lb 9.6 oz (101.4 kg)  11/04/23 219 lb 3.2 oz (99.4 kg)  10/06/23 213 lb (96.6 kg)    GEN: Well nourished, well developed in no acute distress NECK: No JVD; No carotid bruits CARDIAC: RRR, no murmurs, rubs, gallops RESPIRATORY:  Clear to auscultation without rales, wheezing or rhonchi  ABDOMEN: Soft, non-tender, non-distended EXTREMITIES:  No edema; No deformity  ASSESSMENT AND PLAN: .   Assessment and Plan    Hyperlipidemia LDL  cholesterol of 153, statin intolerance reported. Discussed the need for cholesterol-lowering medication due to diabetes. Patient interested in Repatha. -Refer to pharmacy lipid clinic for PCSK9 inhibitor therapy.  Diabetes A1c above 10, difficulty in managing blood sugar levels. -Refer to endocrinology for further evaluation and management.  Chest Pain Non-cardiac in nature, dull and achy, intermittent. Recent CT scan showed no coronary calcium , EKG normal without acute ischemic changes, and echo last year was normal. -Continue to monitor symptoms.  Follow-up -Return visit in 1 year.              Follow-up: Return in about 1 year (around 11/29/2024).   Signed, Darryle DASEN. Barbaraann, MD, [facility]  [facility]  [address], [address] [place], [place] [address] [phone]  11:18 AM

## 2023-11-30 ENCOUNTER — Ambulatory Visit: Payer: Medicaid Other | Attending: Cardiovascular Disease | Admitting: Cardiovascular Disease

## 2023-11-30 ENCOUNTER — Encounter: Payer: Self-pay | Admitting: Cardiovascular Disease

## 2023-11-30 VITALS — BP 112/62 | HR 95 | Ht 64.0 in | Wt 223.6 lb

## 2023-11-30 DIAGNOSIS — Z794 Long term (current) use of insulin: Secondary | ICD-10-CM

## 2023-11-30 DIAGNOSIS — E1165 Type 2 diabetes mellitus with hyperglycemia: Secondary | ICD-10-CM

## 2023-11-30 DIAGNOSIS — I471 Supraventricular tachycardia, unspecified: Secondary | ICD-10-CM

## 2023-11-30 DIAGNOSIS — R072 Precordial pain: Secondary | ICD-10-CM | POA: Diagnosis not present

## 2023-11-30 DIAGNOSIS — I150 Renovascular hypertension: Secondary | ICD-10-CM

## 2023-11-30 DIAGNOSIS — E782 Mixed hyperlipidemia: Secondary | ICD-10-CM | POA: Diagnosis not present

## 2023-11-30 NOTE — Patient Instructions (Addendum)
 Medication Instructions:  Your physician recommends that you continue on your current medications as directed. Please refer to the Current Medication list given to you today.    *If you need a refill on your cardiac medications before your next appointment, please call your pharmacy*   Lab Work: NONE    If you have labs (blood work) drawn today and your tests are completely normal, you will receive your results only by: MyChart Message (if you have MyChart) OR A paper copy in the mail If you have any lab test that is abnormal or we need to change your treatment, we will call you to review the results.   Testing/Procedures: NONE    Follow-Up: At [facility], you and your health needs are our priority.  As part of our continuing mission to provide you with exceptional heart care, we have created designated Provider Care Teams.  These Care Teams include your primary Cardiologist (physician) and Advanced Practice Providers (APPs -  Physician Assistants and Nurse Practitioners) who all work together to provide you with the care you need, when you need it.  We recommend signing up for the patient portal called MyChart.  Sign up information is provided on this After Visit Summary.  MyChart is used to connect with patients for Virtual Visits (Telemedicine).  Patients are able to view lab/test results, encounter notes, upcoming appointments, etc.  Non-urgent messages can be sent to your provider as well.   To learn more about what you can do with MyChart, go to [email or web address].    Your next appointment:   1 year(s)  The format for your next appointment:   In Person  Provider:   Darryle Decent, MD    Other Instructions Dr. Decent has referred you to [facility] for management of your cholesterol and referred you to [facility] for Diabetes Management.

## 2023-12-03 ENCOUNTER — Other Ambulatory Visit (HOSPITAL_COMMUNITY): Payer: Self-pay

## 2023-12-03 ENCOUNTER — Telehealth: Payer: Self-pay | Admitting: Pharmacist

## 2023-12-03 ENCOUNTER — Other Ambulatory Visit: Payer: Self-pay | Admitting: Family Medicine

## 2023-12-03 ENCOUNTER — Ambulatory Visit: Payer: Medicaid Other | Attending: Internal Medicine | Admitting: Pharmacist

## 2023-12-03 ENCOUNTER — Telehealth: Payer: Self-pay | Admitting: Pharmacy Technician

## 2023-12-03 ENCOUNTER — Encounter: Payer: Self-pay | Admitting: Pharmacist

## 2023-12-03 DIAGNOSIS — E782 Mixed hyperlipidemia: Secondary | ICD-10-CM

## 2023-12-03 DIAGNOSIS — I639 Cerebral infarction, unspecified: Secondary | ICD-10-CM

## 2023-12-03 DIAGNOSIS — E119 Type 2 diabetes mellitus without complications: Secondary | ICD-10-CM

## 2023-12-03 NOTE — Patient Instructions (Addendum)
 It was nice meeting you today  We would like your LDL (bad cholesterol) to be less than 55  The medication we talked about today is called Repatha which is an injection you would take once every 2 weeks  I will complete the prior authorization for you and contact you when it is approved  Once you start the medication we will recheck your fasting lipid panel in about 2-3 months  Please message or call with any questions  Medford Bolk, PharmD, BCACP, CDCES, CPP [address], [address] [place], [place], [address] Phone: [phone], Fax: [phone]

## 2023-12-03 NOTE — Telephone Encounter (Signed)
 Pharmacy Patient Advocate Encounter   Received notification from Pt Calls Messages that prior authorization for Repatha SureClick 140MG /ML auto-injectors is required/requested.   Insurance verification completed.   The patient is insured through [facility] MEDICAID .   Per test claim: PA required; PA submitted to above mentioned insurance via CoverMyMeds Key/confirmation #/EOC [id number] Status is pending

## 2023-12-03 NOTE — Telephone Encounter (Signed)
 PA request has been Submitted. New Encounter created for follow up. For additional info see Pharmacy Prior Auth telephone encounter from 12/03/23.

## 2023-12-03 NOTE — Progress Notes (Signed)
 Patient ID: Jessica Ponce                 DOB: [birth year]                    MRN: [id number]     HPI: Jessica Ponce is a 58 y.o. female patient referred to lipid clinic by Dr Barbaraann. [facility] is significant for T2DM, stroke, HTN, HLD, and statin intolerance.  Patient presents today to discuss lipid management. Has tried atorvastatin and rosuvastatin  in past and both caused increased blood sugars. Has been struggling with her blood glucose, recently switched to Lantus .  Reports she eats a very low carb and low gluten diet to try to prevent hyperglycemia.   Has no significant family history of CAD.  Current Medications: N/A  Intolerances:  Rosuvastatin  Atorvastatin  Risk Factors:  T2DM Hx of CVA HLD (possibly familial component)  LDL goal: <55  Labs: TC 267, Trigs 266, HDL 66, LDL 153 (10/04/23)  Past Medical History:  Diagnosis Date   Allergy    Asthma    no current problems, does not use inhaler   Diabetes mellitus without complication (HCC)    type 2   Essential hypertension    Hyperlipidemia    Stroke [facility])     Current Outpatient Medications on File Prior to Visit  Medication Sig Dispense Refill   albuterol  (PROVENTIL ) (2.5 MG/3ML) 0.083% nebulizer solution Take 2.5 mg by nebulization every 4 (four) hours as needed for shortness of breath or wheezing.     amLODipine  (NORVASC ) 10 MG tablet TAKE 1 TABLET BY MOUTH EVERY DAY 90 tablet 1   aspirin  EC 81 MG tablet Take 1 tablet (81 mg total) by mouth daily. Swallow whole. 30 tablet 12   BD PEN NEEDLE NANO 2ND GEN 32G X 4 MM MISC USE AS DIRECTED DAILY 100 each 2   budesonide-formoterol (SYMBICORT ) 160-4.5 MCG/ACT inhaler Inhale 2 puffs into the lungs in the morning and at bedtime. 30.6 g 12   buPROPion  (WELLBUTRIN  XL) 150 MG 24 hr tablet TAKE 1 TABLET BY MOUTH EVERY DAY 90 tablet 0   Continuous Glucose Sensor (DEXCOM G7 SENSOR) MISC Use to check blood glucose throughout the day. Changes sensors once every 10 days. 3 each 6    doxycycline  (VIBRAMYCIN ) 50 MG capsule Take 1 capsule (50 mg total) by mouth daily. 30 capsule 5   gabapentin  (NEURONTIN ) 300 MG capsule TAKE 1 CAPSULE BY MOUTH THREE TIMES A DAY 84 capsule 1   insulin  glargine (LANTUS  SOLOSTAR) 100 UNIT/ML Solostar Pen Inject 40 Units into the skin daily. Increase to 46 units after 1 week if fasting sugar is still above 130. 45 mL 1   losartan  (COZAAR ) 100 MG tablet TAKE 1 TABLET BY MOUTH EVERY DAY 90 tablet 1   metroNIDAZOLE  (METROCREAM ) 0.75 % cream Apply topically 2 (two) times daily. 45 g 3   omeprazole  (PRILOSEC) 20 MG capsule TAKE 1 CAPSULE BY MOUTH EVERY DAY 90 capsule 1   sertraline  (ZOLOFT ) 100 MG tablet TAKE 1 TABLET BY MOUTH EVERY DAY 90 tablet 1   VENTOLIN  HFA 108 (90 Base) MCG/ACT inhaler Inhale 2 puffs into the lungs every 4 (four) hours as needed for wheezing or shortness of breath.     No current facility-administered medications on file prior to visit.    Allergies  Allergen Reactions   Valsartan Diarrhea   Atorvastatin Other (See Comments)    Elevated sugars   Codeine Nausea And Vomiting   Metformin And  Related Diarrhea    Assessment/Plan:  1. Hyperlipidemia - Patient last LDL 153 which is above goal of <55. Possible familial component as LDL was 225 three years ago. Encouraged patient to try statins again since she is being followed for her DM but she is hesitant. Regardless, will need aggressive lipid lowering to reach goal.  Using PCSK9i demo pen, educated patient on mechanism of action, storage, site selection, administration, and possible adverse effects. Will complete PA and contact patient when approved. Recheck fasting lipid panel in 2-3 months.  Start Repatha 140mg  q 2 weeks Recheck lipid panel in 2-3 months  Medford Bolk, PharmD, BCACP, CDCES, CPP [address] [place], [place], [address] Phone: [phone], Fax: [phone]

## 2023-12-03 NOTE — Telephone Encounter (Signed)
 Pharmacy Patient Advocate Encounter  Received notification from [place] MEDICAID that Prior Authorization for Repatha 140mg /ml sureclick has been DENIED.  See denial reason below. No denial letter attached in CMM. Will attach denial letter to Media tab once received.   PA #/Case ID/Reference #: [id number]

## 2023-12-03 NOTE — Telephone Encounter (Signed)
 Please submit PA for Repatha

## 2023-12-03 NOTE — Telephone Encounter (Signed)
 New key [id number]

## 2023-12-08 ENCOUNTER — Telehealth: Payer: Self-pay

## 2023-12-08 ENCOUNTER — Other Ambulatory Visit (HOSPITAL_COMMUNITY): Payer: Self-pay

## 2023-12-08 NOTE — Telephone Encounter (Signed)
 Pharmacy Patient Advocate Encounter  Received notification from [facility] that Prior Authorization for REPATHA has been DENIED.  Full denial letter will be uploaded to the media tab. See denial reason below.  *Denied because patient has not tried and failed zetia .

## 2023-12-08 NOTE — Telephone Encounter (Signed)
 PA request has been Started. New Encounter created for follow up. For additional info see Pharmacy Prior Auth telephone encounter from 12/08/2023.

## 2023-12-08 NOTE — Telephone Encounter (Signed)
 Pharmacy Patient Advocate Encounter   Received notification from CoverMyMeds that prior authorization for REPATHA is required/requested.   Insurance verification completed.   The patient is insured through [facility] .   Per test claim: PA required; PA submitted to above mentioned insurance via CoverMyMeds Key/confirmation #/EOC [id number] Status is pending

## 2023-12-10 ENCOUNTER — Ambulatory Visit: Payer: Medicaid Other | Admitting: Pharmacist

## 2023-12-13 ENCOUNTER — Ambulatory Visit: Payer: Medicaid Other | Attending: Family Medicine | Admitting: Pharmacist

## 2023-12-13 ENCOUNTER — Encounter: Payer: Self-pay | Admitting: Pharmacist

## 2023-12-13 DIAGNOSIS — E782 Mixed hyperlipidemia: Secondary | ICD-10-CM

## 2023-12-13 DIAGNOSIS — Z7985 Long-term (current) use of injectable non-insulin antidiabetic drugs: Secondary | ICD-10-CM

## 2023-12-13 DIAGNOSIS — E119 Type 2 diabetes mellitus without complications: Secondary | ICD-10-CM | POA: Diagnosis not present

## 2023-12-13 DIAGNOSIS — Z794 Long term (current) use of insulin: Secondary | ICD-10-CM | POA: Diagnosis not present

## 2023-12-13 LAB — POCT GLYCOSYLATED HEMOGLOBIN (HGB A1C): HbA1c, POC (controlled diabetic range): 10 % — AB (ref 0.0–7.0)

## 2023-12-13 MED ORDER — INSULIN GLARGINE-YFGN 100 UNIT/ML ~~LOC~~ SOPN: 54.0000 [IU] | PEN_INJECTOR | Freq: Every day | SUBCUTANEOUS | 1 refills | Status: DC

## 2023-12-13 MED ORDER — NOVOLOG FLEXPEN 100 UNIT/ML ~~LOC~~ SOPN: 5.0000 [IU] | PEN_INJECTOR | Freq: Three times a day (TID) | SUBCUTANEOUS | 1 refills | Status: DC

## 2023-12-13 MED ORDER — EZETIMIBE 10 MG PO TABS: 10.0000 mg | ORAL_TABLET | Freq: Every day | ORAL | 1 refills | Status: DC

## 2023-12-13 MED ORDER — BD PEN NEEDLE NANO 2ND GEN 32G X 4 MM MISC: 2 refills | Status: AC

## 2023-12-13 NOTE — Telephone Encounter (Signed)
 Patient started on Zetia at PCP.

## 2023-12-13 NOTE — Progress Notes (Signed)
 S:     Chief Complaint  Patient presents with   Diabetes Mellitus   Hyperlipidemia   58 y.o. female who presents for diabetes evaluation, education, and management. 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   Patient was referred and last seen by Primary Care Provider, Dr. Tanda, on 09/17/23. We saw her on 10/04/23 and changed her Humalog  mix to Lantus . At her last pharmacist visit on 11/09/23, her Semglee  was increased from 30 units daily to 40 units daily with instructions to increase to 46 units, if CBG continued to be > 130 .  Today, patient presents in good spirits and without assistance. She reports that she increased her Semglee  to 46 units daily as instructed without much improvement in her blood sugars. She reports that she has been under some stress at home helping her daughter, but has a goal to increase her attention to self care. Feels that she needs to add meal time insulin . Reports that when she was on Ozempic  0.25 mg she experienced multiple episodes of vomiting overnight and she does not think it was related to food choices. Pt was seen by cardiology recently and PA was submitted for Repatha given statin intolerance (elevated BG) and premature ASCVD - however, PA was denied by [facility] because they require trial of ezetmibe first. She has not heard back from the cardiology office yet.   Family/Social History:  Father- stroke Mother- breast cancer, memory loss  Current diabetes medications include: Semglee  (insulin  glargine) 46u daily  Current hypertension medications include: losartan  100 mg daily, amlodipine  10 mg daily Current hyperlipidemia medications include: none  Previously tried hyperlipidemia medications include: atorvastatin and rosuvastatin  (patient self discontinued due to elevated blood sugar)  Patient reports adherence to taking all medications as prescribed.    Insurance coverage: [place] [employer]  Patient denies hypoglycemic events.   Patient denies nocturia (nighttime urination). Patient endorse polyphagia/polydipsia when her BG is high. Patient reports neuropathy (nerve pain) in her fingers and toes Patient reports visual changes, but does wear glasses and follows with eye doctor. Notes that her vision changed with stroke. Patient reports self foot exams. Has a spot on her heel she wants her PCP to check out.   Patient reported dietary habits: Eats 2 meals/day Breakfast: coffee Lunch: eggs, sausage, cheese Dinner: chicken thighs, tries to stay away from bread Snacks: does admit to snacking at night, cookies, trail mix more lately since she has had increased stress Drinks: water, sometimes puts flavoring in water which is low carb, very little soda  Physical activity: occasional walk with her daughter, cleaning the house. Continues to be limited by [facility] after COVID-19 infection in Aug 2024.    O:  Date of Download: 30 day download, 12/13/23 Average Glucose: 255 mg/dL Glucose Management Indicator: 9.4%  Time in Goal:  - Time in range 70-180: 10% - Time above range (high): 37% - Time above range (very high): 52% - GMI 9.7%  90 day 12/13/23: TIR: 11%, 40% high, 48% very highs  30 day 12/13/23: GMI 9.4%  Lab Results  Component Value Date   HGBA1C 10.0 (A) 12/13/2023   There were no vitals filed for this visit.  Lipid Panel     Component Value Date/Time   CHOL 267 (H) 10/04/2023 1536   TRIG 266 (H) 10/04/2023 1536   HDL 66 10/04/2023 1536   CHOLHDL 4.0 10/04/2023 1536   CHOLHDL  6.9 05/28/2022 0522   VLDL 56 (H) 05/28/2022 0522   LDLCALC 153 (H) 10/04/2023 1536    Clinical Atherosclerotic Cardiovascular Disease (ASCVD): Yes  The ASCVD Risk score (Arnett DK, et al., 2019) failed to calculate for the following reasons:   Risk score cannot be calculated because patient has a medical history suggesting prior/existing ASCVD    Patient is participating in a Managed Medicaid Plan:  No  A/P: Diabetes longstanding currently uncontrolled based A1c today of 10.0% improved from 10.4% 3 months ago, but still above goal < 7%. Correlates with 14d average BG of 255 mg/dL and TIR of 89%. Patient is unfortunately unable to tolerate GLP-1RA or metformin. She is at high risk of GU infections with SGLT2i given A1c > 10% and elevated BMI. Therefore, she requires further titration of basal insulin  and addition of prandial insulin . Patient is able to verbalize appropriate hypoglycemia management plan. Medication adherence appears appropriate. -INCREASE insulin  glargine to 54 units daily -START insulin  aspart (Novolog ) 5 units TID with meals. Educated patient to take meal-time insulin  up to 15 minutes prior to eating or at the start of the meal. -Patient educated on purpose, proper use, and potential adverse effects of insulin  glargine and insulin  aspart.  -Extensively discussed pathophysiology of diabetes, recommended lifestyle interventions, dietary effects on blood sugar control.  -Counseled on s/sx of and management of hypoglycemia.  -Next A1c anticipated 03/12/24.   Hyperlipidemia/ASCVD Risk Reduction: - Currently uncontrolled with LDL-C of 153 mg/dL above goal < 55 mg/dL given premature ASCVD. Currently untreated given pt hesitance to retrial statin with history of worsened BG on the medication. PA for Repatha submitted by cardiology office denied due to requiring trial of ezetimibe . Patient amenable to trying ezetimibe  and following with cardiology for repeat lipid panel to obtain Repatha approval in the future.  - Reviewed long term complications of uncontrolled cholesterol - START ezetimibe  10 mg PO daily  - Communicated with cardiology CPP to facilitate follow-up and Repatha approval.    Written patient instructions provided. Patient verbalized understanding of treatment plan.  Total time in face to face counseling 30 minutes.     Follow-up:  Pharmacist in 1 month  Lorain Baseman, PharmD PGY1 Pharmacy Resident  Herlene Fleeta Morris, PharmD, BCACP, CPP Clinical Pharmacist [facility] [phone]

## 2023-12-21 ENCOUNTER — Ambulatory Visit: Payer: Medicaid Other | Admitting: Family Medicine

## 2023-12-30 ENCOUNTER — Telehealth: Payer: Self-pay

## 2023-12-30 NOTE — Telephone Encounter (Signed)
Are we able to submit a PA for this patient's dexcom? She is on basal + bolus insulin.

## 2023-12-30 NOTE — Telephone Encounter (Signed)
Copied from CRM [id number]. Topic: Clinical - Prescription Issue >> Dec 28, 2023 11:07 AM Shelah Lewandowsky wrote: Reason for CRM: Message for [facility], insurance is saying Continuous Glucose Sensor (DEXCOM G7 SENSOR) MISC is not covered but it was covered last year, please call patient [phone]

## 2023-12-31 ENCOUNTER — Telehealth: Payer: Self-pay

## 2023-12-31 ENCOUNTER — Other Ambulatory Visit: Payer: Self-pay

## 2023-12-31 NOTE — Telephone Encounter (Signed)
Copied from CRM [id number]. Topic: Clinical - Prescription Issue >> Dec 31, 2023 10:11 AM Patsy Lager T wrote: Reason for CRM: patient called stated the script for Continuous Glucose Sensor (DEXCOM G7 SENSOR) needs a prior auth. Please f/u with patient

## 2023-12-31 NOTE — Telephone Encounter (Signed)
Pharmacy Patient Advocate Encounter  Received notification from [facility] that Prior Authorization for [facility] G7 SENSOR has been APPROVED from 12/31/2023 to 12/30/2024   PA #/Case ID/Reference #: [id number]  [facility] HAS BEEN NOTIFIED AND MYCHART MESSAGE SENT TO PATIENT.

## 2023-12-31 NOTE — Telephone Encounter (Signed)
Pharmacy Patient Advocate Encounter   Received notification from CoverMyMeds that prior authorization for [facility] G7 SENSOR is required/requested.   Insurance verification completed.   The patient is insured through [employer] .   Per test claim: PA required; PA submitted to above mentioned insurance via CoverMyMeds Key/confirmation #/EOC [id number] Status is pending

## 2023-12-31 NOTE — Telephone Encounter (Signed)
 Noted thank you

## 2024-01-05 ENCOUNTER — Other Ambulatory Visit: Payer: Self-pay | Admitting: Family Medicine

## 2024-01-05 DIAGNOSIS — E782 Mixed hyperlipidemia: Secondary | ICD-10-CM

## 2024-01-07 ENCOUNTER — Ambulatory Visit (INDEPENDENT_AMBULATORY_CARE_PROVIDER_SITE_OTHER): Payer: Medicaid Other | Admitting: Pulmonary Disease

## 2024-01-07 ENCOUNTER — Encounter: Payer: Self-pay | Admitting: Pulmonary Disease

## 2024-01-07 ENCOUNTER — Ambulatory Visit
Admission: RE | Admit: 2024-01-07 | Discharge: 2024-01-07 | Disposition: A | Discharge status: Home / Self Care | Payer: Medicaid Other | Source: Ambulatory Visit | Attending: Pulmonary Disease | Admitting: Pulmonary Disease

## 2024-01-07 VITALS — BP 124/84 | HR 92 | Temp 97.4°F | Ht 64.0 in | Wt 229.2 lb

## 2024-01-07 DIAGNOSIS — R0602 Shortness of breath: Secondary | ICD-10-CM

## 2024-01-07 DIAGNOSIS — J4540 Moderate persistent asthma, uncomplicated: Secondary | ICD-10-CM

## 2024-01-07 DIAGNOSIS — E66812 Obesity, class 2: Secondary | ICD-10-CM | POA: Diagnosis not present

## 2024-01-07 LAB — NITRIC OXIDE: Nitric Oxide: 62

## 2024-01-07 MED ORDER — SPIRIVA RESPIMAT 1.25 MCG/ACT IN AERS: 2.0000 | INHALATION_SPRAY | Freq: Every day | RESPIRATORY_TRACT | 0 refills | Status: AC

## 2024-01-07 NOTE — Patient Instructions (Addendum)
 VISIT SUMMARY:  Today, we discussed your recent shortness of breath and exhaustion, which you have been experiencing with minimal exertion. We reviewed your asthma management, recent episode of severe vomiting, and new symptoms of swelling in your hands and feet. We also talked about your ongoing issues with diabetes, high cholesterol, and obstructive sleep apnea. Additionally, we addressed your concerns about your thyroid  health and referred you to an endocrinologist for further evaluation.  YOUR PLAN:  -ASTHMA: Asthma is a condition where your airways become inflamed and narrow, making it hard to breathe. Your recent symptoms are likely due to a recent norovirus infection. We will continue your current medication, Symbicort  160 mcg twice daily, and add Spiriva  1.25 mcg 2 puffs once daily. I have provided you with Spiriva  samples and demonstrated how to use it. A chest x-ray has been ordered, and you should follow up with Dr.Assaker as scheduled. Please contact us  if your symptoms worsen.  -OBSTRUCTIVE SLEEP APNEA: Obstructive sleep apnea is a condition where your breathing stops and starts during sleep. You should continue using your CPAP machine and follow up with your neurologist, Dr. Dedra Dohmeier, for ongoing management.  -DIABETES MELLITUS: Diabetes is a condition where your blood sugar levels are too high. We suspect that fluid retention may be contributing to the swelling in your hands and feet. Please continue to monitor your blood glucose levels and follow your current diabetes management plan.  -HYPERLIPIDEMIA: Hyperlipidemia means you have high levels of cholesterol in your blood, which can increase your risk of heart disease. Despite having no blockages, it is important to continue your current cholesterol medication as advised.  -GENERAL HEALTH MAINTENANCE: Given your family history of hypothyroidism,you have referred you to an endocrinologist for further evaluation of your thyroid .  Please follow up with the endocrinologist as recommended.  INSTRUCTIONS:  Please follow up with Dr. Malka on  6 March as scheduled. If your symptoms worsen, contact us  immediately.

## 2024-01-07 NOTE — Progress Notes (Signed)
 Subjective:    Patient ID: Jessica Ponce, female    DOB: February 08, 1966, 58 y.o.   MRN: [id number]  Patient Care Team: Tanda Bleacher, MD as PCP - General (Family Medicine) Malka Domino, MD as Consulting Physician (Pulmonary Disease)  Chief Complaint  Patient presents with   Acute Visit    SOB. Wheezing. Occasional cough.     BACKGROUND: Patient is a 59 year old lifelong never smoker with a history of asthma, hypertension, depression and generalized anxiety disorder presents for an ACUTE VISIT due to increased shortness of breath.  She is a patient of Dr. Domino Malka.   HPI Discussed the use of AI scribe software for clinical note transcription with the patient, who gave verbal consent to proceed.  History of Present Illness   Jessica Ponce is a 58 year old female with asthma and diabetes who presents as an ACUTE VISIT with a complaint of shortness of breath.  She experiences shortness of breath and exhaustion with minimal exertion and activities around the home, such as vacuuming. She has been using her rescue inhaler more frequently in the past few days, approximately twice a day, but not today. She has a history of asthma and uses Symbicort  160 mcg, which she believes helps her condition. She also uses a CPAP machine for sleep apnea, which is managed by her neurologist, Dr. Dedra Dohmeier.  She mentions a recent episode of severe vomiting, which she attributes to a norovirus infection about a week ago. She visited urgent care during this illness and tested negative for influenza. She is concerned about possibly aspirating during the vomiting episode.  She has not had any chest pain, no orthopnea nor paroxysmal nocturnal dyspnea.She reports swelling in her hands and feet, which is a new symptom.   She reports constant body aches and fatigue since experiencing long COVID symptoms starting in September. No reflux issues unless consuming processed or fried foods, and she takes  Prilosec for reflux management. No current fevers or chills but occasional cough and hot flashes, which she attributes to menopause.  She has a history of high cholesterol and diabetes. She has seen a cardiologist for her cholesterol, who confirmed no coronary obstruction.  Family history is significant for hypothyroidism in her sister and mother. She suspects her thyroid  may need further evaluation and is working with a wellness facility and has been referred to an endocrinologist for further assessment.   I have reviewed her records and laboratory data thus far as well as Dr. Nelda notes.   DATA 05/28/2022 echocardiogram: LVEF 55 to 60%, LV function normal.  No regional wall motion abnormalities.  Grade 1 DD.  Right side structure and function normal.  No evidence of pulmonary hypertension. 09/19/2023 CT angio chest: No pulmonary emboli, lung windows unremarkable.  No abnormalities noted. 10/01/2023 CXR PA and lateral: No active cardiopulmonary disease 11/02/2023 PFTs: FEV1 1.82 L or 67% predicted, FVC 2.32 L or 66% predicted, FEV1/FVC 78%, there is small airways component, ERV low at 15% consistent with obesity minimal restriction noted.  Diffusion capacity normal. 11/04/2023 allergen panel: IgE 6, allergen panel negative.    Review of Systems A 10 point review of systems was performed and it is as noted above otherwise negative.   Past Medical History:  Diagnosis Date   Allergy    Asthma    no current problems, does not use inhaler   Diabetes mellitus without complication (HCC)    type 2   Essential hypertension    Hyperlipidemia  Stroke [facility])     Past Surgical History:  Procedure Laterality Date   APPENDECTOMY  1985   exploratory laparoscopy     WISDOM TOOTH EXTRACTION      Patient Active Problem List   Diagnosis Date Noted   Anxiety 08/26/2023   Family history of malignant melanoma of skin 08/01/2023   Complicated migraine 03/21/2023   Acute ischemic stroke  (HCC) 05/27/2022   Type 2 diabetes mellitus with hyperglycemia (HCC) 10/07/2021   SVT (supraventricular tachycardia) (HCC) 10/07/2021   Family history of thyroid  disorder 07/15/2020   Mixed hyperlipidemia 04/23/2020   Family history of breast cancer 03/11/2020   Type 2 diabetes mellitus (HCC) 03/11/2020   Hypertension, essential 07/31/2016   Family history of Alzheimer's disease 08/04/2009    Family History  Problem Relation Age of Onset   Memory loss Mother    Breast cancer Mother 59   Stroke Father    Breast cancer Maternal Aunt 74   Colon cancer Neg Hx    Rectal cancer Neg Hx    Stomach cancer Neg Hx    Esophageal cancer Neg Hx     Social History   Tobacco Use   Smoking status: Never    Passive exposure: Never   Smokeless tobacco: Never  Substance Use Topics   Alcohol use: Yes    Alcohol/week: 0.0 standard drinks of alcohol    Comment: occasional wine    Allergies  Allergen Reactions   Valsartan Diarrhea   Ozempic  (0.25 Or 0.5 Mg-Dose) [Semaglutide (0.25 Or 0.5mg -Dos)] Nausea And Vomiting    Multiple episodes of severe vomiting on Ozempic    Atorvastatin Other (See Comments)    Elevated sugars   Codeine Nausea And Vomiting   Metformin And Related Diarrhea    Current Meds  Medication Sig   albuterol  (PROVENTIL ) (2.5 MG/3ML) 0.083% nebulizer solution Take 2.5 mg by nebulization every 4 (four) hours as needed for shortness of breath or wheezing.   amLODipine  (NORVASC ) 10 MG tablet TAKE 1 TABLET BY MOUTH EVERY DAY   aspirin  EC 81 MG tablet Take 1 tablet (81 mg total) by mouth daily. Swallow whole.   budesonide-formoterol (SYMBICORT ) 160-4.5 MCG/ACT inhaler Inhale 2 puffs into the lungs in the morning and at bedtime.   buPROPion  (WELLBUTRIN  XL) 150 MG 24 hr tablet TAKE 1 TABLET BY MOUTH EVERY DAY   Continuous Glucose Sensor (DEXCOM G7 SENSOR) MISC Use to check blood glucose throughout the day. Changes sensors once every 10 days.   doxycycline  (VIBRAMYCIN ) 50 MG  capsule Take 1 capsule (50 mg total) by mouth daily.   ezetimibe  (ZETIA ) 10 MG tablet TAKE 1 TABLET BY MOUTH EVERY DAY   gabapentin  (NEURONTIN ) 300 MG capsule TAKE 1 CAPSULE BY MOUTH THREE TIMES A DAY   insulin  aspart (NOVOLOG  FLEXPEN) 100 UNIT/ML FlexPen Inject 5 Units into the skin 3 (three) times daily with meals.   insulin  glargine-yfgn (SEMGLEE ) 100 UNIT/ML Pen Inject 54 Units into the skin daily.   Insulin  Pen Needle (BD PEN NEEDLE NANO 2ND GEN) 32G X 4 MM MISC Use to inject insulin  a total of 4 times daily.   losartan  (COZAAR ) 100 MG tablet TAKE 1 TABLET BY MOUTH EVERY DAY   metroNIDAZOLE  (METROCREAM ) 0.75 % cream Apply topically 2 (two) times daily.   omeprazole  (PRILOSEC) 20 MG capsule TAKE 1 CAPSULE BY MOUTH EVERY DAY   sertraline  (ZOLOFT ) 100 MG tablet TAKE 1 TABLET BY MOUTH EVERY DAY   VENTOLIN  HFA 108 (90 Base) MCG/ACT inhaler Inhale 2 puffs into  the lungs every 4 (four) hours as needed for wheezing or shortness of breath.    Immunization History  Administered Date(s) Administered   Influenza,inj,Quad PF,6+ Mos 09/16/2020   PFIZER(Purple Top)SARS-COV-2 Vaccination 03/22/2020, 04/12/2020   Zoster, Live 09/16/2020        Objective:     BP 124/84 (BP Location: Right Arm, Cuff Size: Large)   Pulse 92   Temp (!) 97.4 F (36.3 C)   Ht 5' 4 (1.626 m)   Wt 229 lb 3.2 oz (104 kg)   LMP 12/10/2015 (LMP Unknown)   SpO2 92%   BMI 39.34 kg/m   SpO2: 92 % O2 Device: None (Room air)  GENERAL: Obese woman, in no acute distress, fully ambulatory.  No conversational dyspnea. HEAD: Normocephalic, atraumatic.  EYES: Pupils equal, round, reactive to light.  No scleral icterus.  MOUTH: Dentition intact, oral mucosa moist.  No thrush. NECK: Supple. No thyromegaly. Trachea midline. No JVD.  No adenopathy. PULMONARY: Good air entry bilaterally.  Coarse, otherwise, no adventitious sounds. CARDIOVASCULAR: S1 and S2. Regular rate and rhythm.  No rubs, murmurs or gallops  heard. ABDOMEN: Benign. MUSCULOSKELETAL: No joint deformity, no clubbing, no overt edema appreciated.  NEUROLOGIC: No overt focal deficit, no gait disturbance, speech is fluent. SKIN: Intact,warm,dry. PSYCH: Appears preoccupied.  Mildly anxious.  Ambulatory oxymetry was performed today:  At rest on room air oxygen saturation was 94%, the patient ambulated at a moderate pace, completed 3 laps, O2 nadir 94%, moderate shortness of breath.  Resting heart rate was 88 bpm at maximum for this exercise 106 bpm.  Lab Results  Component Value Date   NITRICOXIDE 62 01/07/2024  *54>>41>>62   Assessment & Plan:     ICD-10-CM   1. Shortness of breath  R06.02 Nitric oxide     DG Chest 2 View    2. Moderate persistent asthma, unspecified whether complicated  J45.40    FeNO 62 ppb Suspect element LONA asthma associated with obesity    3. Obesity, Class II, BMI 35-39.9  Z33.187      Orders Placed This Encounter  Procedures   Nitric oxide    Meds ordered this encounter  Medications   Tiotropium Bromide Monohydrate  (SPIRIVA  RESPIMAT) 1.25 MCG/ACT AERS    Sig: Inhale 2 puffs into the lungs daily.    Dispense:  12 g    Refill:  0    Lot Number?:   [id number]    Expiration Date?:   06/30/2024    Quantity:   3   Discussion:    Asthma/Moderate Persistent/ LONA Asthma associated with obesity Asthma exacerbation with shortness of breath and wheezing. FeNO 62 ppb. Symptoms likely triggered by recent norovirus infection. No active wheezing noted during examination.  Patient has not exhibited eosinophilia related IgE previously.  Avoiding steroids due to diabetes. Singulair not recommended due to potential for depression and suicidal thoughts in a patient with generalized anxiety disorder. Discussed risks and benefits of continuing Symbicort  and adding Spiriva . Explained that Spiriva  is used once daily and demonstrated its usage. - Continue Symbicort  160 mcg twice daily - Add Spiriva  1.25 mcg, 2 puffs once  daily - Provide Spiriva  samples and demonstrate usage - Order chest x-ray - Follow-up with Dr. Malka on March 6 - Advise to contact if symptoms worsen  Obstructive Sleep Apnea Obstructive sleep apnea managed with CPAP. Follow-up with neurologist Dr. Dedra Dohmeier for CPAP management. - Continue using CPAP - Follow-up with neurologist Dr. Dedra Dohmeier  Diabetes Mellitus Diabetes with potential fluid retention  contributing to swelling in hands and feet. Avoiding steroids to prevent glycemic control disruption. - Monitor blood glucose levels - Continue current diabetes management plan  Hyperlipidemia Hyperlipidemia with family history. Advised to take cholesterol medication as recommended by cardiology. - Continue current lipid management plan  General Health Maintenance Referred to an endocrinologist by primary care for further thyroid  evaluation due to family history of hypothyroidism. - Follow-up with endocrinologist for thyroid  evaluation  Follow-up - Follow-up with Dr. Malka as scheduled on 6 March - Immediate follow-up if symptoms worsen.     Advised if symptoms do not improve or worsen, to please contact office for sooner follow up or seek emergency care.    I spent 40 minutes of dedicated to the care of this patient on the date of this encounter to include pre-visit review of records, face-to-face time with the patient discussing conditions above, post visit ordering of testing, clinical documentation with the electronic health record, making appropriate referrals as documented, and communicating necessary findings to members of the patients care team.   C. Leita Sanders, MD Advanced Bronchoscopy PCC[facility]    *This note was dictated using voice recognition software/Dragon.  Despite best efforts to proofread, errors can occur which can change the meaning. Any transcriptional errors that result from this process are unintentional and may  not be fully corrected at the time of dictation.

## 2024-01-08 ENCOUNTER — Other Ambulatory Visit: Payer: Self-pay | Admitting: Family Medicine

## 2024-01-11 ENCOUNTER — Encounter: Payer: Self-pay | Admitting: Family Medicine

## 2024-01-11 ENCOUNTER — Ambulatory Visit: Payer: Medicaid Other | Admitting: Family Medicine

## 2024-01-11 VITALS — BP 124/82 | HR 83 | Temp 97.8°F | Resp 16 | Ht 65.0 in | Wt 228.0 lb

## 2024-01-11 DIAGNOSIS — E66812 Obesity, class 2: Secondary | ICD-10-CM

## 2024-01-11 DIAGNOSIS — E1165 Type 2 diabetes mellitus with hyperglycemia: Secondary | ICD-10-CM | POA: Diagnosis not present

## 2024-01-11 DIAGNOSIS — K219 Gastro-esophageal reflux disease without esophagitis: Secondary | ICD-10-CM

## 2024-01-11 DIAGNOSIS — I10 Essential (primary) hypertension: Secondary | ICD-10-CM | POA: Diagnosis not present

## 2024-01-11 DIAGNOSIS — M79672 Pain in left foot: Secondary | ICD-10-CM

## 2024-01-11 DIAGNOSIS — F419 Anxiety disorder, unspecified: Secondary | ICD-10-CM

## 2024-01-11 DIAGNOSIS — Z794 Long term (current) use of insulin: Secondary | ICD-10-CM | POA: Diagnosis not present

## 2024-01-11 DIAGNOSIS — E782 Mixed hyperlipidemia: Secondary | ICD-10-CM | POA: Diagnosis not present

## 2024-01-11 DIAGNOSIS — Z6837 Body mass index (BMI) 37.0-37.9, adult: Secondary | ICD-10-CM

## 2024-01-11 DIAGNOSIS — F32A Depression, unspecified: Secondary | ICD-10-CM

## 2024-01-11 MED ORDER — VENTOLIN HFA 108 (90 BASE) MCG/ACT IN AERS
2.0000 | INHALATION_SPRAY | RESPIRATORY_TRACT | 2 refills | Status: AC | PRN
Start: 2024-01-11 — End: ?

## 2024-01-11 NOTE — Progress Notes (Unsigned)
Established Patient Office Visit  Subjective    Patient ID: Jessica Ponce, female    DOB: [birth year]  Age: 58 y.o. MRN: [id number]  CC:  Chief Complaint  Patient presents with   Follow-up    3 month, body aches all the time, feet swelling, hand are swelling and aching, bottom of right heel is in pain    HPI Jessica Ponce presents for routine follow up of chronic med issues including diabetes and hypertension. Patient also reports bilateral foot pain. Denies known trauma or injury.   Outpatient Encounter Medications as of 01/11/2024  Medication Sig   albuterol (PROVENTIL) (2.5 MG/3ML) 0.083% nebulizer solution Take 2.5 mg by nebulization every 4 (four) hours as needed for shortness of breath or wheezing.   amLODipine (NORVASC) 10 MG tablet TAKE 1 TABLET BY MOUTH EVERY DAY   aspirin EC 81 MG tablet Take 1 tablet (81 mg total) by mouth daily. Swallow whole.   budesonide-formoterol (SYMBICORT) 160-4.5 MCG/ACT inhaler Inhale 2 puffs into the lungs in the morning and at bedtime.   buPROPion (WELLBUTRIN XL) 150 MG 24 hr tablet TAKE 1 TABLET BY MOUTH EVERY DAY   Continuous Glucose Sensor (DEXCOM G7 SENSOR) MISC Use to check blood glucose throughout the day. Changes sensors once every 10 days.   doxycycline (VIBRAMYCIN) 50 MG capsule Take 1 capsule (50 mg total) by mouth daily.   ezetimibe (ZETIA) 10 MG tablet TAKE 1 TABLET BY MOUTH EVERY DAY   gabapentin (NEURONTIN) 300 MG capsule TAKE 1 CAPSULE BY MOUTH THREE TIMES A DAY   insulin aspart (NOVOLOG FLEXPEN) 100 UNIT/ML FlexPen Inject 5 Units into the skin 3 (three) times daily with meals.   insulin glargine-yfgn (SEMGLEE) 100 UNIT/ML Pen Inject 54 Units into the skin daily.   Insulin Pen Needle (BD PEN NEEDLE NANO 2ND GEN) 32G X 4 MM MISC Use to inject insulin a total of 4 times daily.   losartan (COZAAR) 100 MG tablet TAKE 1 TABLET BY MOUTH EVERY DAY   metroNIDAZOLE (METROCREAM) 0.75 % cream Apply topically 2 (two) times daily.   omeprazole  (PRILOSEC) 20 MG capsule TAKE 1 CAPSULE BY MOUTH EVERY DAY   sertraline (ZOLOFT) 100 MG tablet TAKE 1 TABLET BY MOUTH EVERY DAY   Tiotropium Bromide Monohydrate (SPIRIVA RESPIMAT) 1.25 MCG/ACT AERS Inhale 2 puffs into the lungs daily.   VENTOLIN HFA 108 (90 Base) MCG/ACT inhaler Inhale 2 puffs into the lungs every 4 (four) hours as needed for wheezing or shortness of breath.   No facility-administered encounter medications on file as of 01/11/2024.    Past Medical History:  Diagnosis Date   Allergy    Asthma    no current problems, does not use inhaler   Diabetes mellitus without complication (HCC)    type 2   Essential hypertension    Hyperlipidemia    Stroke [facility])     Past Surgical History:  Procedure Laterality Date   APPENDECTOMY  1985   exploratory laparoscopy     WISDOM TOOTH EXTRACTION      Family History  Problem Relation Age of Onset   Memory loss Mother    Breast cancer Mother 72   Stroke Father    Breast cancer Maternal Aunt 74   Colon cancer Neg Hx    Rectal cancer Neg Hx    Stomach cancer Neg Hx    Esophageal cancer Neg Hx     Social History   Socioeconomic History   Marital status: Married    Spouse  name: [name]   Number of children: 2   Years of education: 16   Highest education level: Bachelor's degree (e.g., BA, AB, BS)  Occupational History   Occupation: Self Employed  Tobacco Use   Smoking status: Never    Passive exposure: Never   Smokeless tobacco: Never  Vaping Use   Vaping status: Never Used  Substance and Sexual Activity   Alcohol use: Yes    Alcohol/week: 0.0 standard drinks of alcohol    Comment: occasional wine   Drug use: No   Sexual activity: Yes    Birth control/protection: Post-menopausal  Other Topics Concern   Not on file  Social History Narrative   Lives with husband and 2 kids   Caffeine use: Drinks 2-3 cups coffee per day   Social Drivers of Health   Financial Resource Strain: Medium Risk (12/09/2023)   Overall  Financial Resource Strain (CARDIA)    Difficulty of Paying Living Expenses: Somewhat hard  Food Insecurity: Food Insecurity Present (12/09/2023)   Hunger Vital Sign    Worried About Running Out of Food in the Last Year: Sometimes true    Ran Out of Food in the Last Year: Sometimes true  Transportation Needs: No Transportation Needs (12/09/2023)   PRAPARE - Administrator, Civil Service (Medical): No    Lack of Transportation (Non-Medical): No  Physical Activity: Insufficiently Active (12/09/2023)   Exercise Vital Sign    Days of Exercise per Week: 2 days    Minutes of Exercise per Session: 20 min  Stress: Stress Concern Present (12/09/2023)   [employer] of Occupational Health - Occupational Stress Questionnaire    Feeling of Stress : Rather much  Social Connections: Socially Integrated (12/09/2023)   Social Connection and Isolation Panel [NHANES]    Frequency of Communication with Friends and Family: Twice a week    Frequency of Social Gatherings with Friends and Family: Once a week    Attends Religious Services: 1 to 4 times per year    Active Member of [employer] or Organizations: No    Attends Engineer, structural: More than 4 times per year    Marital Status: Married  Catering manager Violence: Not on file    Review of Systems  All other systems reviewed and are negative.       Objective    BP 124/82 (BP Location: Right Arm, Patient Position: Sitting, Cuff Size: Large)   Pulse 83   Temp 97.8 F (36.6 C) (Oral)   Resp 16   Ht 5\' 5"  (1.651 m)   Wt 228 lb (103.4 kg)   LMP 12/10/2015 (LMP Unknown)   SpO2 95%   BMI 37.94 kg/m   Physical Exam Vitals and nursing note reviewed.  Constitutional:      General: She is not in acute distress.    Appearance: She is obese.  Cardiovascular:     Rate and Rhythm: Normal rate and regular rhythm.  Pulmonary:     Effort: Pulmonary effort is normal.     Breath sounds: Normal breath sounds.  Abdominal:      Palpations: Abdomen is soft.     Tenderness: There is no abdominal tenderness.  Neurological:     General: No focal deficit present.     Mental Status: She is alert and oriented to person, place, and time.         Assessment & Plan:   1. Type 2 diabetes mellitus with hyperglycemia, with long-term current use of insulin (HCC) (  Primary) Recent A1c elevated. continue  2. Encounter for long-term (current) use of insulin (HCC)   3. Essential hypertension Appears stable. Continue   4. Mixed hyperlipidemia   5. Gastroesophageal reflux disease without esophagitis Continue  6. Anxiety and depression Continue   7. Class 2 severe obesity due to excess calories with serious comorbidity and body mass index (BMI) of 37.0 to 37.9 in adult (HCC)   8. Left foot pain Referral for further eval/mgt  - Ambulatory referral to Podiatry     No follow-ups on file.   Tommie Raymond, MD

## 2024-01-13 ENCOUNTER — Encounter: Payer: Self-pay | Admitting: Family Medicine

## 2024-01-13 ENCOUNTER — Other Ambulatory Visit: Payer: Medicaid Other

## 2024-01-16 NOTE — Progress Notes (Unsigned)
S:     No chief complaint on file.  58 y.o. female who presents for diabetes evaluation, education, and management.   Patient was referred and last seen by Primary Care Provider, Dr. Andrey Campanile, on 01/11/24. Patient endorsed left foot pain. Was started on ezetimibe 10 mg. She is scheduled to meet with podiatry on 01/25/24.  Last saw pharmacy on 12/13/2023. A1c was 10.0. Pt was seen by cardiology recently and PA was submitted for Repatha given statin intolerance (elevated BG) and premature ASCVD - however, PA was denied by [facility] because they require trial of ezetmibe first. She has not heard back from the cardiology office yet. Increased Lantus to 40 units daily. Educated patient to increase to 46u daily in 1 week if home CBG readings are >130 fasting.    PMH is significant for T2DM, stroke, asthma, long COVID sx, hyperlipidemia.   Today, patient presents in good spirits and without assistance. *** Was diagnosed as GDM ~58 YO. Was managed initially with insulin and then insulin was stopped when she delivered her son. She was tried on metformin after that. She has also tried Ozempic in the past. Was then placed on insulin again. Since being changed to Lantus, her sugars at home remain elevated.   Family/Social History:  Father- stroke Mother- breast cancer, memory loss  Current diabetes medications include: Lantus 40u daily *** Current hypertension medications include: losartan 100 mg daily, amlodipine 10 mg daily Current hyperlipidemia medications include: none Previously tried hyperlipidemia medications include: atorvastatin and rosuvastatin (patient self discontinued due to elevated blood sugar)  Patient reports adherence to taking all medications as prescribed. ***  Insurance coverage: [place] Medicaid  Patient denies hypoglycemic events. ***  Patient denies nocturia (nighttime urination). *** Patient endorse polyphagia when her BG is high. Patient reports neuropathy (nerve  pain). Patient reports visual changes, but does wear glasses and follows with eye doctor. Notes that her vision changed with stroke. Patient reports self foot exams.   Patient reported dietary habits: Eats 2 meals/day *** Breakfast: coffee Lunch: eggs, sausage, cheese Dinner: chicken thighs, tries to stay away from bread Snacks: does admit to snacking at night, cookies, trail mix more lately since she has had increased stress Drinks: water, sometimes puts flavoring in water which is low carb, very little soda  O:  Date of Download: 30 day download, *** Average Glucose: *** Glucose Management Indicator: ***  Time in Goal:  - Time in range 70-180: *** - Time above range: *** - Time below range:***  Lab Results  Component Value Date   HGBA1C 10.0 (A) 12/13/2023   There were no vitals filed for this visit.  Lipid Panel     Component Value Date/Time   CHOL 267 (H) 10/04/2023 1536   TRIG 266 (H) 10/04/2023 1536   HDL 66 10/04/2023 1536   CHOLHDL 4.0 10/04/2023 1536   CHOLHDL 6.9 05/28/2022 0522   VLDL 56 (H) 05/28/2022 0522   LDLCALC 153 (H) 10/04/2023 1536   Clinical Atherosclerotic Cardiovascular Disease (ASCVD): Yes  The ASCVD Risk score (Arnett DK, et al., 2019) failed to calculate for the following reasons:   Risk score cannot be calculated because patient has a medical history suggesting prior/existing ASCVD   Patient is participating in a Managed Medicaid Plan: Yes   Labs -Microalbumin/creatinine ratio (05/14/2023): 7 -Lipids(10/04/2023)    + LDL:153    + Triglycerides: 266 -BMP(12/23/23)    +eGFR: 74    +electrolytes: 3.5 K  Assess -home glucose -sx  hyper/hypoglycemia -adherence/tolerance -diet/exercise changes  Plan -max basal ~46 units -add bolus insulin: 10% bolus=5 units basal lispro/aspart before lunch/dinner -consider trial Trulicity 0.75 mg weekly -lipid panel April: 6-12 wks after starting ezetimibe (01/05/24)   .Marland Kitchenhistory of ischemic stroke. PCSK-9i  would probably be more effective if than ezetimibe if intolerance to statin -start fenofibrate 40 for elevated TG   A/P: Diabetes longstanding currently uncontrolled based on last A1c 10.4%. A1c goal <7. Clarity reports ***. Patient is able to verbalize appropriate hypoglycemia management plan. Medication adherence appears appropriate. -*** Lantus to *** units daily.  -Patient educated on purpose, proper use, and potential adverse effects of Lantus.  -Extensively discussed pathophysiology of diabetes, recommended lifestyle interventions, dietary effects on blood sugar control.  -Counseled on s/sx of and management of hypoglycemia.  -Next A1c anticipated 4/25.   Written patient instructions provided. Patient verbalized understanding of treatment plan.  Total time in face to face counseling 30 minutes.    Follow-up:  PCP: 04/10/2024 Pharmacy:  Seen By: Haywood Filler, PharmD Candidate [employer]  Class of 2027  Butch Penny, PharmD, [place], CPP Clinical Pharmacist [facility] [phone]

## 2024-01-17 ENCOUNTER — Ambulatory Visit: Payer: Medicaid Other | Attending: Nurse Practitioner | Admitting: Pharmacist

## 2024-01-17 DIAGNOSIS — E119 Type 2 diabetes mellitus without complications: Secondary | ICD-10-CM

## 2024-01-17 DIAGNOSIS — Z794 Long term (current) use of insulin: Secondary | ICD-10-CM | POA: Diagnosis not present

## 2024-01-17 MED ORDER — NOVOLOG FLEXPEN 100 UNIT/ML ~~LOC~~ SOPN: 10.0000 [IU] | PEN_INJECTOR | Freq: Three times a day (TID) | SUBCUTANEOUS | 1 refills | Status: DC

## 2024-01-18 ENCOUNTER — Encounter: Payer: Self-pay | Admitting: Pharmacist

## 2024-01-18 ENCOUNTER — Ambulatory Visit: Payer: Medicaid Other | Admitting: "Endocrinology

## 2024-01-25 ENCOUNTER — Ambulatory Visit (INDEPENDENT_AMBULATORY_CARE_PROVIDER_SITE_OTHER): Payer: Medicaid Other | Admitting: Podiatry

## 2024-01-25 ENCOUNTER — Ambulatory Visit (INDEPENDENT_AMBULATORY_CARE_PROVIDER_SITE_OTHER): Payer: Medicaid Other

## 2024-01-25 ENCOUNTER — Encounter: Payer: Self-pay | Admitting: Podiatry

## 2024-01-25 DIAGNOSIS — M79672 Pain in left foot: Secondary | ICD-10-CM

## 2024-01-25 DIAGNOSIS — M722 Plantar fascial fibromatosis: Secondary | ICD-10-CM | POA: Diagnosis not present

## 2024-01-25 NOTE — Patient Instructions (Signed)
Call to schedule physical therapy: [facility] [address]  [phone]  Plantar Fasciitis (Heel Spur Syndrome) with Rehab The plantar fascia is a fibrous, ligament-like, soft-tissue structure that spans the bottom of the foot. Plantar fasciitis is a condition that causes pain in the foot due to inflammation of the tissue. SYMPTOMS  Pain and tenderness on the underneath side of the foot. Pain that worsens with standing or walking. CAUSES  Plantar fasciitis is caused by irritation and injury to the plantar fascia on the underneath side of the foot. Common mechanisms of injury include: Direct trauma to bottom of the foot. Damage to a small nerve that runs under the foot where the main fascia attaches to the heel bone. Stress placed on the plantar fascia due to bone spurs. RISK INCREASES WITH:  Activities that place stress on the plantar fascia (running, jumping, pivoting, or cutting). Poor strength and flexibility. Improperly fitted shoes. Tight calf muscles. Flat feet. Failure to warm-up properly before activity. Obesity. PREVENTION Warm up and stretch properly before activity. Allow for adequate recovery between workouts. Maintain physical fitness: Strength, flexibility, and endurance. Cardiovascular fitness. Maintain a health body weight. Avoid stress on the plantar fascia. Wear properly fitted shoes, including arch supports for individuals who have flat feet.  PROGNOSIS  If treated properly, then the symptoms of plantar fasciitis usually resolve without surgery. However, occasionally surgery is necessary.  RELATED COMPLICATIONS  Recurrent symptoms that may result in a chronic condition. Problems of the lower back that are caused by compensating for the injury, such as limping. Pain or weakness of the foot during push-off following surgery. Chronic inflammation, scarring, and partial or complete fascia  tear, occurring more often from repeated injections.  TREATMENT  Treatment initially involves the use of ice and medication to help reduce pain and inflammation. The use of strengthening and stretching exercises may help reduce pain with activity, especially stretches of the Achilles tendon. These exercises may be performed at home or with a therapist. Your caregiver may recommend that you use heel cups of arch supports to help reduce stress on the plantar fascia. Occasionally, corticosteroid injections are given to reduce inflammation. If symptoms persist for greater than 6 months despite non-surgical (conservative), then surgery may be recommended.   MEDICATION  If pain medication is necessary, then nonsteroidal anti-inflammatory medications, such as aspirin and ibuprofen, or other minor pain relievers, such as acetaminophen, are often recommended. Do not take pain medication within 7 days before surgery. Prescription pain relievers may be given if deemed necessary by your caregiver. Use only as directed and only as much as you need. Corticosteroid injections may be given by your caregiver. These injections should be reserved for the most serious cases, because they may only be given a certain number of times.  HEAT AND COLD Cold treatment (icing) relieves pain and reduces inflammation. Cold treatment should be applied for 10 to 15 minutes every 2 to 3 hours for inflammation and pain and immediately after any activity that aggravates your symptoms. Use ice packs or massage the area with a piece of ice (ice massage). Heat treatment may be used prior to performing the stretching and strengthening activities prescribed by your caregiver, physical therapist, or athletic trainer. Use a heat pack or soak the injury in warm water.  SEEK IMMEDIATE MEDICAL CARE IF: Treatment seems to offer no benefit, or the condition worsens. Any medications produce adverse side effects.  EXERCISES- RANGE OF MOTION  (  ROM) AND STRETCHING EXERCISES - Plantar Fasciitis (Heel Spur Syndrome) These exercises may help you when beginning to rehabilitate your injury. Your symptoms may resolve with or without further involvement from your physician, physical therapist or athletic trainer. While completing these exercises, remember:  Restoring tissue flexibility helps normal motion to return to the joints. This allows healthier, less painful movement and activity. An effective stretch should be held for at least 30 seconds. A stretch should never be painful. You should only feel a gentle lengthening or release in the stretched tissue.  RANGE OF MOTION - Toe Extension, Flexion Sit with your right / left leg crossed over your opposite knee. Grasp your toes and gently pull them back toward the top of your foot. You should feel a stretch on the bottom of your toes and/or foot. Hold this stretch for 10 seconds. Now, gently pull your toes toward the bottom of your foot. You should feel a stretch on the top of your toes and or foot. Hold this stretch for 10 seconds. Repeat  times. Complete this stretch 3 times per day.   RANGE OF MOTION - Ankle Dorsiflexion, Active Assisted Remove shoes and sit on a chair that is preferably not on a carpeted surface. Place right / left foot under knee. Extend your opposite leg for support. Keeping your heel down, slide your right / left foot back toward the chair until you feel a stretch at your ankle or calf. If you do not feel a stretch, slide your bottom forward to the edge of the chair, while still keeping your heel down. Hold this stretch for 10 seconds. Repeat 3 times. Complete this stretch 2 times per day.   STRETCH  Gastroc, Standing Place hands on wall. Extend right / left leg, keeping the front knee somewhat bent. Slightly point your toes inward on your back foot. Keeping your right / left heel on the floor and your knee straight, shift your weight toward the wall, not allowing  your back to arch. You should feel a gentle stretch in the right / left calf. Hold this position for 10 seconds. Repeat 3 times. Complete this stretch 2 times per day.  STRETCH  Soleus, Standing Place hands on wall. Extend right / left leg, keeping the other knee somewhat bent. Slightly point your toes inward on your back foot. Keep your right / left heel on the floor, bend your back knee, and slightly shift your weight over the back leg so that you feel a gentle stretch deep in your back calf. Hold this position for 10 seconds. Repeat 3 times. Complete this stretch 2 times per day.  STRETCH  Gastrocsoleus, Standing  Note: This exercise can place a lot of stress on your foot and ankle. Please complete this exercise only if specifically instructed by your caregiver.  Place the ball of your right / left foot on a step, keeping your other foot firmly on the same step. Hold on to the wall or a rail for balance. Slowly lift your other foot, allowing your body weight to press your heel down over the edge of the step. You should feel a stretch in your right / left calf. Hold this position for 10 seconds. Repeat this exercise with a slight bend in your right / left knee. Repeat 3 times. Complete this stretch 2 times per day.   STRENGTHENING EXERCISES - Plantar Fasciitis (Heel Spur Syndrome)  These exercises may help you when beginning to rehabilitate your injury. They may resolve your   symptoms with or without further involvement from your physician, physical therapist or athletic trainer. While completing these exercises, remember:  Muscles can gain both the endurance and the strength needed for everyday activities through controlled exercises. Complete these exercises as instructed by your physician, physical therapist or athletic trainer. Progress the resistance and repetitions only as guided.  STRENGTH - Towel Curls Sit in a chair positioned on a non-carpeted surface. Place your foot on a  towel, keeping your heel on the floor. Pull the towel toward your heel by only curling your toes. Keep your heel on the floor. Repeat 3 times. Complete this exercise 2 times per day.  STRENGTH - Ankle Inversion Secure one end of a rubber exercise band/tubing to a fixed object (table, pole). Loop the other end around your foot just before your toes. Place your fists between your knees. This will focus your strengthening at your ankle. Slowly, pull your big toe up and in, making sure the band/tubing is positioned to resist the entire motion. Hold this position for 10 seconds. Have your muscles resist the band/tubing as it slowly pulls your foot back to the starting position. Repeat 3 times. Complete this exercises 2 times per day.  Document Released: 11/16/2005 Document Revised: 02/08/2012 Document Reviewed: 02/28/2009 [employer] Patient Information 2014 [employer].  

## 2024-01-26 NOTE — Progress Notes (Signed)
  Subjective:  Patient ID: Jessica Ponce, female    DOB: [birth year],  MRN: [id number]  Chief Complaint  Patient presents with   Foot Pain    Patient states that her left foot heel hurts really bad and by the end of the day her left heel is hard to walk on very painful, patient takes tylenol for pain,this begin about 2 to 3 months ago but it has got worst.    Discussed the use of AI scribe software for clinical note transcription with the patient, who gave verbal consent to proceed.  History of Present Illness   Jessica Ponce is a 58 year old female with diabetes mellitus who presents with heel pain.  She has been experiencing heel pain at the bottom of her heel for the past couple of months. Initially, she thought it would improve on its own, but it has persisted. The pain is described as 'unbearable' towards the end of the day. No previous history of plantar fasciitis. Pain is exacerbated by pressure on certain areas of the heel, particularly the plantar medial band of the central fascia.  She mentions having wide feet and a high arch, which makes finding supportive shoes challenging. Her son has very flat feet, which contrasts with her high arches.  Her past medical history includes an elevated A1c level, currently at ten percent. She is on long-acting insulin and mealtime insulin, as she does not tolerate metformin. She is actively working on improving her A1c levels and has recently increased her mealtime insulin dosage.  She is currently taking Prilosec (omeprazole) for gastric reflux and heartburn symptoms, which she manages by avoiding certain foods like seed oils and spices.          Objective:    Physical Exam   EXTREMITIES: Palpable pulse, good capillary refill time, foot warm and well perfused. MUSCULOSKELETAL: Pain on palpation of plantar medial band of central plantar fascia. No pain with lateral compression of foot.       No images are attached to the encounter.     Results   LABS A1c: 10%  RADIOLOGY Foot X-ray: Pes cavus, no calcaneal spur, no fracture or stress fracture (01/25/2024)      Assessment:   1. Foot pain, left   2. Plantar fasciitis of left foot      Plan:  Patient was evaluated and treated and all questions answered.  Assessment and Plan    Plantar Fasciitis Pain on palpation of the plantar medial band of the central fascia. High arches and prolonged symptoms. X-ray unremarkable. -Start physical therapy at [facility] in [place]. -Use Voltaren topically four times a day. -Wear a walking boot for all ambulation, but not necessary during sleep. -Continue wearing supportive shoes. -Return for follow-up in one month. If pain persists, consider steroid injection, keeping in mind the potential impact on A1C levels.  Type 2 Diabetes Elevated A1C at 10%, but patient is actively working on management with long-acting insulin and mealtime insulin adjustments. -Continue current management plan and monitor A1C levels.          Return in about 6 weeks (around 03/07/2024) for recheck plantar fasciitis.

## 2024-01-27 ENCOUNTER — Telehealth: Payer: Self-pay | Admitting: Adult Health

## 2024-01-27 NOTE — Telephone Encounter (Signed)
 I called pt.  Initially I was going to put her with MM/NP but then speaking with pt she has been to ED back 03/2023 for numbness (facial) and stroke r/out and ? Complicated migraine.  Has not been seen for this previously.  Has appt with Dr. Frances Furbish 04-15-2024 , is on waitlist.  I told her since she has hx of stroke, that if worsening sx of stroke she will need to go to ED she verbalized understanding. She said her sx are middle lip/lower lip/tongue.  No other sx, her sx  comes and goes. She verbalized understanding.

## 2024-01-27 NOTE — Telephone Encounter (Signed)
 Pt has called requesting a f/u for her CPAP and face numbness,  Pt has not been to ED, pt advised if she has stroke like symptoms she should call 911 /be taken to ED.  Pt has asked the appointment be scheduled with wait list

## 2024-02-01 ENCOUNTER — Other Ambulatory Visit: Payer: Self-pay

## 2024-02-01 ENCOUNTER — Ambulatory Visit: Payer: Medicaid Other | Attending: Podiatry | Admitting: Physical Therapy

## 2024-02-01 DIAGNOSIS — M25511 Pain in right shoulder: Secondary | ICD-10-CM | POA: Insufficient documentation

## 2024-02-01 DIAGNOSIS — M6281 Muscle weakness (generalized): Secondary | ICD-10-CM

## 2024-02-01 DIAGNOSIS — R262 Difficulty in walking, not elsewhere classified: Secondary | ICD-10-CM | POA: Diagnosis present

## 2024-02-01 DIAGNOSIS — M79672 Pain in left foot: Secondary | ICD-10-CM

## 2024-02-01 DIAGNOSIS — M722 Plantar fascial fibromatosis: Secondary | ICD-10-CM | POA: Diagnosis not present

## 2024-02-01 NOTE — Therapy (Signed)
 OUTPATIENT PHYSICAL THERAPY LOWER EXTREMITY EVALUATION   Patient Name: Jessica Ponce MRN: [id number] DOB:[birth year], 58 y.o., female Today's Date: 02/01/2024  END OF SESSION:  PT End of Session - 02/01/24 0808     Visit Number 1    Number of Visits 13    Date for PT Re-Evaluation 03/16/24    Authorization Type [employer]    PT Start Time 0801    PT Stop Time 0845    PT Time Calculation (min) 44 min    Activity Tolerance Patient limited by pain    Behavior During Therapy [facility] for tasks assessed/performed             Past Medical History:  Diagnosis Date   Allergy    Asthma    no current problems, does not use inhaler   Diabetes mellitus without complication (HCC)    type 2   Essential hypertension    Hyperlipidemia    Stroke [facility])    Past Surgical History:  Procedure Laterality Date   APPENDECTOMY  1985   exploratory laparoscopy     WISDOM TOOTH EXTRACTION     Patient Active Problem List   Diagnosis Date Noted   Anxiety 08/26/2023   Family history of malignant melanoma of skin 08/01/2023   Complicated migraine 03/21/2023   Acute ischemic stroke (HCC) 05/27/2022   Type 2 diabetes mellitus with hyperglycemia (HCC) 10/07/2021   SVT (supraventricular tachycardia) (HCC) 10/07/2021   Family history of thyroid disorder 07/15/2020   Mixed hyperlipidemia 04/23/2020   Family history of breast cancer 03/11/2020   Type 2 diabetes mellitus (HCC) 03/11/2020   Hypertension, essential 07/31/2016   Family history of Alzheimer's disease 08/04/2009    PCP: Georganna Skeans, MD   REFERRING PROVIDER: Edwin Cap, DPM   REFERRING DIAG: M72.2 (ICD-10-CM) - Plantar fasciitis of left foot   THERAPY DIAG:  Pain in left foot - Plan: PT plan of care cert/re-cert  Muscle weakness (generalized) - Plan: PT plan of care cert/re-cert  Difficulty in walking, not elsewhere classified - Plan: PT plan of care cert/re-cert  Rationale for Evaluation and Treatment:  Rehabilitation  ONSET DATE: Approximately 6 months and worsening  SUBJECTIVE:   SUBJECTIVE STATEMENT: I have had this pain for about 6months and it was getting unbearable.  I just was put into a walking boot.  Dr Lilian Kapur wants me to do PT for about 6 weeks and then re evaluate.  I  have trouble walking, not really had stairs to walk on.  Without the boot I am so painful.  With the boot I feel awkward and it bothers my knees. My pain is unbearable toward end of day and I can only tolerate about 5 min of standing or walking  PERTINENT HISTORY: DM , HTN Asthma, Hyperlipidemia, appt 1985, Hs of frozen shld R, obesity PAIN:  Are you having pain? Yes: NPRS scale: at rest 1/10 but when I stand or walk 7/10 at the end of the day with the boot and without the boot 9/10 Pain location: Left heel Pain description: left heel plantar medial band of plantar fascia Aggravating factors: moving my foot. I can stand for about 5 min before I start feeling pain. Can't shop and household chores Relieving factors: walking boot  PRECAUTIONS: Other: Valsarten , mold dust, pollen, codeine, metformin Atorvastatin, ozempic  RED FLAGS: None   WEIGHT BEARING RESTRICTIONS: No  FALLS:  Has patient fallen in last 6 months? 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  DIAGNOSTIC FINDINGS: Foot X-ray: Pes cavus, no calcaneal spur, no fracture or stress fracture (01/25/2024)  per Dr Katina Dung  PATIENT SURVEYS:   LEFS 39/80   48.8%  COGNITION: Overall cognitive status: Within functional limits for tasks assessed     SENSATION: WFL  EDEMA:  NT  MUSCLE LENGTH: Hamstrings:Bil tightness   POSTURE: rounded shoulders, forward head, anterior pelvic tilt, and obesity and pes cavus bil  PALPATION: TTP left heel plantar medial band of plantar fascia   LOWER EXTREMITY ROM:  Active ROM Right eval Left eval  Hip flexion    Hip extension    Hip abduction    Hip adduction    Hip internal rotation    Hip external rotation    Knee flexion 125 130  Knee extension 0 0  Ankle dorsiflexion 10 5  Ankle plantarflexion 51 50  Ankle inversion 42 40  Ankle eversion 30 29   (Blank rows = not tested)  LOWER EXTREMITY MMT:  MMT Right eval Left eval  Hip flexion 4+ 4+  Hip extension 4 4  Hip abduction 4 4-  Hip adduction    Hip internal rotation    Hip external rotation    Knee flexion 5 5  Knee extension 5 4+  Ankle dorsiflexion    Ankle plantarflexion    Ankle inversion    Ankle eversion 15/25 0/25   (Blank rows = not tested)  LOWER EXTREMITY SPECIAL TESTS:  NT  FUNCTIONAL TESTS:  5 times sit to stand: 18.06 sec  2 minute walk test: 354.6FT   (545ft Norm) using in boot   4/10 at end of 2 min   SL balance on R is 23 sec L in boot3 sec GAIT: Distance walked: 5 times sit to stand: 18.06 sec  2 minute walk test: 354.6FT   (554ft Norm) using in boot Assistive device utilized: None Level of assistance: Complete Independence Comments: Pt unsteady due to pain in left foot in walking boot                                                                                                                                TREATMENT DATE: 02-01-24  Eval and issue HEP    PATIENT EDUCATION:  Education details: POC Explanation of findings, issue HEP Person educated: Patient Education method: Explanation, Demonstration, Tactile cues, Verbal cues, and Handouts Education comprehension: verbalized  understanding, returned demonstration, verbal cues required, and needs further  education  HOME EXERCISE PROGRAM: Access Code: [email or web address] URL: [email or web address] Date: 02/01/2024 Prepared by: Garen Lah  Exercises - Heel raise with counter support and towel under toes  - 1 x daily - 7 x weekly - 3 sets - 10 reps - Single Leg Stance  - 1 x daily - 7 x weekly - 30 to 60 seconds hold - Sit to stand with sink support Movement snack  - 1 x daily - 7 x weekly - 3 sets - 10 reps - Seated Heel Raise  - 1 x daily - 7 x weekly - 1-2 sets - 10 reps - Seated Plantar Fascia Stretch  - 2 x daily - 7 x weekly - 1 sets - 3 reps - 30 second hold - Seated Self Great Toe Stretch  - 1 x daily - 7 x weekly - 1 sets - 10-15 reps - Gastroc Stretch on Wall  - 1 x daily - 7 x weekly - 3 sets - 10 reps - Supine Bridge  - 1 x daily - 7 x weekly - 3 sets - 10 reps  ASSESSMENT:  CLINICAL IMPRESSION: Patient is a 59 y.o. femsle who was seen today for physical therapy evaluation and treatment for Left plantar fasciitis.  Pt enters clinic with walking boot on left and unable to tolerate longer than 5 min standing without pain to 7/10  Pt reports 9/10 pain without walking boot.  Pt will benefit from skilled PT to address limitations in walking and daily activities to return to maximize functional mobility.   OBJECTIVE IMPAIRMENTS: decreased activity tolerance, decreased endurance, difficulty walking, decreased ROM, decreased strength, obesity, and pain.   ACTIVITY LIMITATIONS: standing, squatting, stairs, and locomotion level  PARTICIPATION LIMITATIONS: meal prep, cleaning, laundry, shopping, community activity, and substitute teaching  PERSONAL FACTORS: M72.2 (ICD-10-CM) - Plantar fasciitis of left foot  are also affecting patient's functional outcome.   REHAB POTENTIAL: Good  CLINICAL DECISION MAKING: Evolving/moderate complexity  EVALUATION COMPLEXITY: Moderate   GOALS: Goals reviewed  with patient? Yes  SHORT TERM GOALS: Target date: 02-22-24 Pt will be independent with initial HEP Baseline:no knowledge Goal status: INITIAL  2.  Pt with be educated on home walking program Baseline: no knowledge Goal status: INITIAL  3.  Pt pain level in boot will reduce to 5/10 Baseline: Pt in boot max 7/10 and out of boot 9/10 Goal status: INITIAL    LONG TERM GOALS: Target date: 03-16-24  Pt will be independent with advanced HEP Baseline: no knowledge Goal status: INITIAL  2.  Pt will be able to perform household chores with minimal pain for 30 minutes Baseline: Pt unable to stand for 5 min without 7-9/10 pain Goal status: INITIAL  3.  Pt will be able to perform 6 MWT within normal score Baseline: 5 times sit to stand: 18.06 sec  2 minute walk test: 354.6FT   (566ft Norm) using in boot Goal status: INITIAL  4.  Pt will be consistent with home walking program at least 25 min a day with minimal pain  Baseline: unable to walk for exercise over 5 min Goal status: INITIAL  5.  Pt will be able to wean to a normal tennis show with minimal pain Baseline: unable to walk without walking boot Goal status: INITIAL  6.  Pt LEFS will improve to at least 51/80 to show improved functional change Baseline: eval LEFS 39/80   48.8% Goal status: INITIAL  7.  Pt will be able to perform standing SL heel raise  on LT 15/25 x to show improved tolerance and strength  Baseline: L SL  0/25  Goal status: INITIAL   PLAN:  PT FREQUENCY: 1-2x/week  PT DURATION: 6 weeks  PLANNED INTERVENTIONS: 97164- PT Re-evaluation, 97110-Therapeutic exercises, 97530- Therapeutic activity, 97112- Neuromuscular re-education, 97535- Self Care, 97140- Manual therapy, [phone]- Gait training, [phone]- Electrical stimulation (unattended), [phone]- Electrical stimulation (manual), Patient/Family education, Balance training, Stair training, Taping, Dry Needling, Joint mobilization, Cryotherapy, and Moist heat  PLAN  FOR NEXT SESSION: TPDN, Manual , HEP   Garen Lah, PT, ATRIC Certified Exercise Expert for the Aging Adult  02/01/24 10:24 AM Phone: [phone] Fax: [phone]   For all possible CPT codes, reference the Planned Interventions line above.     Check all conditions that are expected to impact treatment: {Conditions expected to impact treatment:Musculoskeletal disorders   If treatment provided at initial evaluation, no treatment charged due to lack of authorization.

## 2024-02-02 ENCOUNTER — Other Ambulatory Visit: Payer: Self-pay | Admitting: Family Medicine

## 2024-02-02 ENCOUNTER — Encounter: Payer: Self-pay | Admitting: Pulmonary Disease

## 2024-02-02 ENCOUNTER — Ambulatory Visit: Payer: Medicaid Other | Admitting: Pulmonary Disease

## 2024-02-02 VITALS — BP 140/80 | HR 88 | Temp 97.8°F | Ht 65.0 in | Wt 233.0 lb

## 2024-02-02 DIAGNOSIS — J4540 Moderate persistent asthma, uncomplicated: Secondary | ICD-10-CM

## 2024-02-02 DIAGNOSIS — J4551 Severe persistent asthma with (acute) exacerbation: Secondary | ICD-10-CM | POA: Diagnosis not present

## 2024-02-02 LAB — NITRIC OXIDE: Nitric Oxide: 29

## 2024-02-02 NOTE — Telephone Encounter (Signed)
 I called pt.  I was able to move up appt for her.  She was last seen 07-08-2022 for stroke by MM. Due to family/ job issues, she was not able to keep f/u.  She is now able to resume selfcare.  She was last seen in ED 03-2023 for stroke like sx/ ? Hemiplegic stroke sx.  (Numbness in face/lip area).  She also uses cpap, (lost to follow up as well).  She has 02-16-2024 Dr. Frances Furbish for cpap. As well.  (Can cancel if talks about cpap at the 3//2025 visit.  Pt appreciative.

## 2024-02-02 NOTE — Progress Notes (Signed)
 Synopsis: Referred in by Georganna Skeans, MD   Subjective:   PATIENT ID: Jessica Ponce GENDER: female DOB: 02/14/1966, MRN: [id number]  Chief Complaint  Patient presents with   Follow-up    SOB with exertion.     HPI Jessica Ponce is a pleasant 58 year old female patient with a past medical history of mild intermittent asthma, hypertension, depression on Wellbutrin presenting to the pulmonary clinic for further evaluation of her asthma.  She said she contracted COVID in end of August September she started having worsening symptoms.  Described as chest tightness and inability to take in deep breath.  Also reports dry cough persistent throughout the day.  She was started on prednisone taper by her primary care physician. She also been using albuterol nebulizer and albuterol inhaler as needed on a daily basis.  Completed steroid taper. Feeling better but not fully back to baseline. Not using her rescue inhaler.   She was diagnosed with asthma as a child, before that episode has been only on albuterol inhaler as needed.  She has never been hospitalized or intubated for asthma.  Her triggers are mostly viral infections but also she is somewhat sensitive to strong scents and high humidity.  In the interim, she was seen by colleague Dr. Jayme Cloud who added LAMA to her regimen.   PFTs 10/2023 with signs of gas trapping and decreased PEFR  w/ nl DLCO all consistent with asthma.   Allergen Panel 2024 was negative/   She feels overall well this morning however with persistent shortness of breath specifically on exertion.   Family history - she denies any family history of asthma  Social history - never smoker denies alcohol use and denies illicit drug use.  Works as a Lawyer  ROS All systems were reviewed and are negative except for the above. Objective:   Vitals:   02/02/24 0902  BP: (!) 140/80  Pulse: 88  Temp: 97.8 F (36.6 C)  TempSrc: Temporal  SpO2: 96%  Weight: 233 lb  (105.7 kg)  Height: 5\' 5"  (1.651 m)    96% on RA BMI Readings from Last 3 Encounters:  02/02/24 38.77 kg/m  01/11/24 37.94 kg/m  01/07/24 39.34 kg/m   Wt Readings from Last 3 Encounters:  02/02/24 233 lb (105.7 kg)  01/11/24 228 lb (103.4 kg)  01/07/24 229 lb 3.2 oz (104 kg)    Physical Exam GEN: NAD, Healthy Appearing HEENT: Supple Neck, Reactive Pupils, EOMI  CVS: Normal S1, Normal S2, RRR, systolic murmur heard best at the right sternal border. Lungs: Clear bilateral air entry.  Abdomen: Soft, non tender, non distended, + BS  Extremities: Warm and well perfused, No edema  Skin: No suspicious lesions appreciated  Psych: Normal Affect  Labs and imaging were reviewed.  Ancillary Information   CBC    Component Value Date/Time   WBC 12.9 (H) 10/01/2023 1723   RBC 4.67 10/01/2023 1723   HGB 13.7 10/01/2023 1723   HGB 13.6 06/26/2021 0921   HCT 40.7 10/01/2023 1723   HCT 40.8 06/26/2021 0921   PLT 352 10/01/2023 1723   PLT 328 06/26/2021 0921   MCV 87.2 10/01/2023 1723   MCV 88 06/26/2021 0921   MCH 29.3 10/01/2023 1723   MCHC 33.7 10/01/2023 1723   RDW 13.2 10/01/2023 1723   RDW 13.0 06/26/2021 0921   LYMPHSABS 3.8 10/01/2023 1723   LYMPHSABS 3.3 (H) 06/26/2021 0921   MONOABS 0.4 10/01/2023 1723   EOSABS 0.5 10/01/2023 1723   EOSABS 0.3 06/26/2021  9147   BASOSABS 0.1 10/01/2023 1723   BASOSABS 0.1 06/26/2021 0921   Labs and imaging were reviewed.     Latest Ref Rng & Units 11/02/2023    2:26 PM  PFT Results  FVC-Pre L 2.32   FVC-Predicted Pre % 66   FVC-Post L 2.43   FVC-Predicted Post % 69   Pre FEV1/FVC % % 78   Post FEV1/FCV % % 79   FEV1-Pre L 1.82   FEV1-Predicted Pre % 67   FEV1-Post L 1.93   DLCO uncorrected ml/min/mmHg 17.23   DLCO UNC% % 81   DLVA Predicted % 100   TLC L 4.71   TLC % Predicted % 90   RV % Predicted % 117     Assessment & Plan:  Jessica Ponce is a pleasant 58 year old female patient with a past medical history of mild  intermittent asthma, hypertension, depression on Wellbutrin presenting to the pulmonary clinic for further evaluation of her asthma.  #Severe persistent asthma  EOS 500 FENO 54 --> 41 12/5  ACT 12/05 - 16. --> 20 01/2024  Exacerbated by a viral infection. No eczema or allergic rhinitis. No hypersensitivites to cold air or humidity.   []  C/w Budesonide-Formoterol [Symbicort] 160-4.5 2 puffs BID. Advised mouth rinse post each use.  []  C/w Spiriva daily  []  C/w Albuterol 2puffs Q6H as needed  []  Obtain Antigen panel and total IgE.  []  Agreed to hold off on referral to [facility] asthma/allergy for now until we reassess in 3 months.   #OSA on CPAP #GERD on pantoprazole 40mg  PO daily  RTC 3 months   I spent 35 minutes caring for this patient today, including preparing to see the patient, obtaining a medical history , reviewing a separately obtained history, performing a medically appropriate examination and/or evaluation, counseling and educating the patient/family/caregiver, ordering medications, tests, or procedures, documenting clinical information in the electronic health record, and independently interpreting results (not separately reported/billed) and communicating results to the patient/family/caregiver  Janann Colonel, MD [facility] 02/02/2024 9:05 AM

## 2024-02-03 ENCOUNTER — Ambulatory Visit: Admitting: Neurology

## 2024-02-03 ENCOUNTER — Ambulatory Visit: Payer: Medicaid Other | Admitting: Pulmonary Disease

## 2024-02-03 VITALS — BP 142/78 | HR 83 | Ht 65.0 in | Wt 227.0 lb

## 2024-02-03 DIAGNOSIS — R030 Elevated blood-pressure reading, without diagnosis of hypertension: Secondary | ICD-10-CM | POA: Diagnosis not present

## 2024-02-03 DIAGNOSIS — G4733 Obstructive sleep apnea (adult) (pediatric): Secondary | ICD-10-CM | POA: Diagnosis not present

## 2024-02-03 DIAGNOSIS — R29818 Other symptoms and signs involving the nervous system: Secondary | ICD-10-CM | POA: Diagnosis not present

## 2024-02-03 DIAGNOSIS — R200 Anesthesia of skin: Secondary | ICD-10-CM | POA: Diagnosis not present

## 2024-02-03 NOTE — Patient Instructions (Signed)
 We will do a brain scan, called MRI and call you with the test results. We will have to schedule you for this on a separate date. This test requires authorization from your insurance, and we will take care of the insurance process. Your symptoms do not sound like a migraine or mini-stroke of stroke.  Should you have any sudden onset of one-sided weakness, numbness, tingling, shortness of breath, chest pain, one-sided headache or severe headache, changes in mental state, please proceed to the ER or call 911 immediately.  Please continue using your autoPAP regularly. While your insurance requires that you use PAP at least 4 hours each night on 70% of the nights, I recommend, that you not skip any nights and use it throughout the night if you can. Getting used to PAP and staying with the treatment long term does take time and patience and discipline. Untreated obstructive sleep apnea when it is moderate to severe can have an adverse impact on cardiovascular health and raise her risk for heart disease, arrhythmias, hypertension, congestive heart failure, stroke and diabetes. Untreated obstructive sleep apnea causes sleep disruption, nonrestorative sleep, and sleep deprivation. This can have an impact on your day to day functioning and cause daytime sleepiness and impairment of cognitive function, memory loss, mood disturbance, and problems focussing. Using PAP regularly can improve these symptoms.  We can see you in 1 year, you can see one of our nurse practitioners as you are stable with your autoPAP. I will see you after that.   Continue exercising regularly and take your medications as directed. Please make sure, you have follow up with your primary care and other specialists for diabetes management (A1c goal of less than 7.0), blood pressure (hypertension) control and optimizing cholesterol management (with LDL goal of less than 70), exercising daily or regularly within your own mobility limitations of  course, and overall cardiovascular risk factor reduction, which includes treatment of obstructive sleep apnea (OSA) and weight management.   Your BP is a little up today.  You are compliant with your autoPAP for your sleep apnea. Continue to work on weight reduction.

## 2024-02-03 NOTE — Telephone Encounter (Signed)
 From what I can see in the chart, her primary neurologist is actually Dr. Lucia Gaskins? I had seen her for OSA and Jessica Ponce has seen her for OSA and her other neuro concerns.

## 2024-02-03 NOTE — Telephone Encounter (Signed)
 Noted.

## 2024-02-03 NOTE — Progress Notes (Signed)
 Subjective:    Patient ID: Jessica Ponce is a 58 y.o. female.  HPI    Huston Foley, MD, PhD [facility] [address], [address] [address] [place], [place] [address]  I saw patient, Jessica Ponce, for a new problem visit of facial numbness. She is referred by the ED. [name] is a 58 year old female with an underlying medical history of asthma, allergies, diabetes, hypertension, hyperlipidemia, prior strokelike symptoms, obesity and OSA, who reports intermittent lower facial numbness bilaterally.  Symptoms come and go and have been ongoing for 5 or 6 years altogether.  Her latest episode of lower face numbness started about 2 weeks ago and is intermittent.  She feels that she has numbness around her mouth and sometimes even in her tongue.  She denies any tongue weakness or slurring of speech.  No significant headache currently.    She reports that she had a stroke in 2023.  I reviewed hospital records from June 2023.  Discharge diagnoses from her admission was TIA versus anxiety status post TNK due to strokelike symptoms and stroke risk factors including hypertension, hyperlipidemia, uncontrolled diabetes, obesity and migraine headaches.  A brain scan did not show a prior stroke.  She has no one-sided weakness or numbness or tingling or droopy face or slurring of speech.  She denies any history of recent migraines.  She had migraines in the past, particularly around her menstrual cycle.  She tries to hydrate well.  She does not drink any alcohol or rare only.  She limits her caffeine to 2 cups of coffee in the morning.  She has had some weight fluctuation and is working on weight loss.  She is supposed to see a endocrinologist.     She is compliant with her  AutoPap machine.  I reviewed her latest compliance data for the past month and she used her machine 100% with percent use days greater than 4 hours at 97%, average usage of 7 hours and 11 minutes, average AHI 1/h, average  pressure 10.8 cm with a range of 5 to 11 cm.  I reviewed recent blood work in her chart, lipid panel showed elevated total cholesterol in November 2024 at 267, triglycerides elevated at 266, LDL elevated at 153.  Her blood pressure is elevated today.  Her A1c was significantly elevated in the recent past, in the 2 digits.  Last A1c in October 2024 was 10.4.     She had an MRI of the brain wo contrast on 03/21/23 and I reviewed the results:   IMPRESSION: 1. Normal MRI appearance of the brain. No acute or focal lesion to explain the patient's symptoms. 2. Prominent adenoid tissue. Question URI.     She had an MRA of the head wo contrast on 03/21/23 and I reviewed the results:    IMPRESSION: Normal MRA circle-of-Willis without significant proximal stenosis, aneurysm, or branch vessel occlusion.    She had an MRA of the neck wo contrast on 03/21/23 and I reviewed the results:    IMPRESSION: 1. Negative MRA of the neck. No significant proximal stenosis or large vessel occlusion.   She had an MRI of the brain wo contrast on 06/07/22 and I reviewed the results:    IMPRESSION: Normal brain MRI.    She had an MRI of the brain wo contrast on 05/28/22 and I reviewed the results:   IMPRESSION: Negative MRI head   She had a brain MRI wo contrast and C spine MRI wo contrast on  12/12/15 and I reviewed the results:   IMPRESSION: MRI HEAD IMPRESSION:   Normal brain MRI.   MRI CERVICAL SPINE IMPRESSION:   1. No acute abnormality within the cervical spine. 2. Reversal of the normal cervical lordosis with moderate multilevel degenerative spondylolysis as above. There is resultant moderate to severe left foraminal narrowing at C5-6, with more moderate left foraminal stenosis at C6-7, C7-T1 and C2-3. Moderate to severe right foraminal stenosis at C6-7, with more moderate right foraminal narrowing at C5-6. Mild canal stenosis at C4-5 through C6-7.   She saw Dr. Lucia Gaskins in 2017 for neck pain  and facial numbness.   She was previously followed in our sleep clinic.  Previously (copied from previous notes for reference):    07/08/2022 Butch Penny, NP): <<Ms. Jessica Ponce is a 58 year old female with a history of stroke.  She returns today for follow-up.Reports that symptoms started with face going numb and then the arm. She felt her left leg get weak. initially went to urgent care. By the time she got to the hospital she could not left left leg. Received TNK. She reports that she still gets some numbness in the face. Reports that it comes and goes. Reports that left arm feels different since stroke but no weakness. Reports that since the stroke left big toe is numb and sometimes right big toe as well.    Reports that she sees in PCP on Monday and they will recheck blood work she thinks. Reports blood sugars are all over the place. Reports Average is 193.  Remains on Crestor for her cholesterol.  She states that she has stopped the Plavix but she also stopped aspirin.  She did not realize she should continue aspirin.  Patient reports that she is under a lot of stress.  Reports that her daughter is wanting to transition to a female   Patient has been seeing Dr. Frances Furbish for sleep apnea reports that she has not been using her CPAP machine because something is wrong with the water chamber.  She has not taken it to her DME company   HISTORY Copied from Dr. Roda Shutters notes: Ms. Jessica Ponce is a 58 y.o. female with history of HTN, HLD, DM and OSA presenting with acute onset left sided numbness and left leg drift.  She presented to the ED and was given TNK.  Symptoms have since improved, but some residual diminished sensation on the left side remains, along with slowed fine motor movements in the left hand (patient is left handed).  Encouraged knitting and other activities using fine motor function to improve deficits.  MRI pending, to be performed around 1900 tonight.  Of note, patient states that she has been unable  to do the things she needs to due to control her diabetes and hyperlipidemia due to recent loss of health insurance.   >>  10/14/2021: 58 year old right-handed woman with an underlying medical history of diabetes, hypertension, hyperlipidemia, allergies, asthma, and obesity, who presents for a virtual video based visit through MyChart for follow-up consultation of her obstructive sleep apnea after a home sleep test and starting AutoPap therapy.  The patient is unaccompanied today and joins from home via cell phone, I am located in my office at [facility]. I first met her 08/20/2020, at which time she reported snoring and excessive daytime somnolence.  She was advised to proceed with a sleep study.  She had a home sleep test on 09/16/2020 indicated overall mild obstructive sleep apnea with an AHI of 11.7/h,  O2 nadir 84%.  She was encouraged to start AutoPap therapy for symptom control.  Her set up date was 03/17/2021.    I have reviewed her AutoPap compliance data from 09/01/2021 through 09/30/2021, which is a total of 30 days, during which time she used her machine every night with percent used days greater than 4 hours at 77%, indicating adequate compliance, she had great compliance back in mid August onwards.  Currently, her residual AHI is at goal at 0.8/h, pressure for the 95th percentile at 10.2 cm with a range of 5 cm to 11 cm, leak acceptable with a 95th percentile at 15.3 L/min.  She reports that she had significant interim stressors.  It took her several months to get her machine in the first place and once she got it she got COVID for the second time and could not use it consistently.  She was able to use it much more consistently after she got over the COVID symptoms.  She has benefited from treatment and endorses good compliance, improvement in her daytime energy and somnolence and better sleep consolidation and sleep quality at night, her husband is pleased that she does not snore  any longer.  She has had interim stress back in the summer because of her daughter's challenges.  Her daughter has finally been diagnosed with high functioning autism.  Patient was not taking strict care of her own diabetes and her own health and developed flulike symptoms several days ago and had a very significant cough with that.  She started having palpitations and needed to go to the emergency room.  I reviewed the emergency room and hospital records from last week, she was admitted from 10/06/2021 through 10/07/2021.  She had SVT, responded to adenosine, did not need any cardioversion but did get very scared.  Her blood sugar was highly elevated, A1c above 10.  She is endorsing better blood sugar control at home.  She is going to see a new primary care physician, Dr. Andrey Campanile next week.  She also endorses worsening symptoms of depression, she is only on sertraline 50 mg strength at this time and is wondering if her antidepressant needs to be increased.  She is holding up okay at this time.  She has a good support system, husband is very supportive.   She has some mouth dryness with the AutoPap use, she uses a nasal mask.  DME is [employer].  She is very motivated to continue with treatment.  08/20/20: (She) reports snoring and excessive daytime somnolence.  I reviewed your office note from 07/15/2020.  Her Epworth sleepiness score is 8 out of 24, fatigue severity score is 37 out of 63.  She has some trouble going to sleep and staying asleep.  Since she started Zoloft for anxiety, her sleep difficulties have improved a little bit.  She has no family history of sleep apnea.  She has had occasional morning headaches and has nocturia about once or twice per average night.  She lives with her family which includes her husband and younger daughter, aged 43, she also has a grandson, age 52 who lives in the [place] area.  She goes to bed between 11 and midnight and rise time is around 7 but she is often up by 6.  They have 2  cats in the household which do not sleep in their bedroom typically.  She has a TV in the bedroom but does not watch it at night.  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  She works from 12:30 PM to 9 PM.  In the past, when she had difficulty going to sleep or staying asleep she tried Unisom and also melatonin, melatonin did not help very much.  She does not currently take any over-the-counter sleep aid or prescription sleep aid.  She is a non-smoker, drinks alcohol occasionally in the form of wine and drinks caffeine in the form of coffee, 2 cups in the mornings and sometimes a 3rd cup in the early afternoon.  She has recently noticed trembling that affects her when she is still and mostly feels it inside, her husband has never seen it, she does not have any hand tremor or head tremor or leg tremor, no family history of tremors but paternal grandfather had Parkinson's disease.     Her Past Medical History Is Significant For: Past Medical History:  Diagnosis Date   Allergy    Asthma    no current problems, does not use inhaler   Diabetes mellitus without complication (HCC)    type 2   Essential hypertension    Hyperlipidemia    Stroke [facility])     Her Past Surgical History Is Significant For: Past Surgical History:  Procedure Laterality Date   APPENDECTOMY  1985   exploratory laparoscopy     WISDOM TOOTH EXTRACTION      Her Family History Is Significant For: Family History  Problem Relation Age of Onset   Memory loss Mother    Breast cancer Mother 66   Stroke Father    Breast cancer Maternal Aunt 25   Colon cancer Neg Hx    Rectal cancer Neg Hx    Stomach cancer Neg Hx    Esophageal cancer Neg Hx     Her Social History Is Significant For: Social History   Socioeconomic History   Marital status: Married    Spouse name: [name]   Number of children: 2   Years of education: 16   Highest education level: Bachelor's degree (e.g., BA, AB, BS)   Occupational History   Occupation: Self Employed  Tobacco Use   Smoking status: Never    Passive exposure: Never   Smokeless tobacco: Never  Vaping Use   Vaping status: Never Used  Substance and Sexual Activity   Alcohol use: Yes    Alcohol/week: 0.0 standard drinks of alcohol    Comment: occasional wine   Drug use: No   Sexual activity: Yes    Birth control/protection: Post-menopausal  Other Topics Concern   Not on file  Social History Narrative   Lives with husband and 2 kids   Caffeine use: Drinks 2-3 cups coffee per day   Social Drivers of Health   Financial Resource Strain: Medium Risk (12/09/2023)   Overall Financial Resource Strain (CARDIA)    Difficulty of Paying Living Expenses: Somewhat hard  Food Insecurity: Food Insecurity Present (12/09/2023)   Hunger Vital Sign    Worried About Running Out of Food in the Last Year: Sometimes true    Ran Out of Food in the Last Year: Sometimes true  Transportation Needs: No Transportation Needs (12/09/2023)   PRAPARE - Administrator, Civil Service (Medical): No    Lack of Transportation (Non-Medical): No  Physical Activity: Insufficiently Active (12/09/2023)   Exercise Vital Sign    Days of Exercise per Week: 2 days    Minutes of Exercise per Session: 20 min  Stress: Stress Concern Present (12/09/2023)   [employer] of  Occupational Health - Occupational Stress Questionnaire    Feeling of Stress : Rather much  Social Connections: Socially Integrated (12/09/2023)   Social Connection and Isolation Panel [NHANES]    Frequency of Communication with Friends and Family: Twice a week    Frequency of Social Gatherings with Friends and Family: Once a week    Attends Religious Services: 1 to 4 times per year    Active Member of [employer] or Organizations: No    Attends Engineer, structural: More than 4 times per year    Marital Status: Married    Her Allergies Are:  Allergies  Allergen Reactions   Valsartan  Diarrhea   Ozempic (0.25 Or 0.5 Mg-Dose) [Semaglutide(0.25 Or 0.5mg -Dos)] Nausea And Vomiting    Multiple episodes of severe vomiting on Ozempic   Atorvastatin Other (See Comments)    Elevated sugars   Codeine Nausea And Vomiting   Metformin And Related Diarrhea  :   Her Current Medications Are:  Outpatient Encounter Medications as of 02/03/2024  Medication Sig   albuterol (PROVENTIL) (2.5 MG/3ML) 0.083% nebulizer solution Take 2.5 mg by nebulization every 4 (four) hours as needed for shortness of breath or wheezing.   amLODipine (NORVASC) 10 MG tablet TAKE 1 TABLET BY MOUTH EVERY DAY   aspirin EC 81 MG tablet Take 1 tablet (81 mg total) by mouth daily. Swallow whole.   budesonide-formoterol (SYMBICORT) 160-4.5 MCG/ACT inhaler Inhale 2 puffs into the lungs in the morning and at bedtime.   buPROPion (WELLBUTRIN XL) 150 MG 24 hr tablet TAKE 1 TABLET BY MOUTH EVERY DAY   Continuous Glucose Sensor (DEXCOM G7 SENSOR) MISC Use to check blood glucose throughout the day. Changes sensors once every 10 days.   doxycycline (VIBRAMYCIN) 50 MG capsule Take 1 capsule (50 mg total) by mouth daily.   ezetimibe (ZETIA) 10 MG tablet TAKE 1 TABLET BY MOUTH EVERY DAY   gabapentin (NEURONTIN) 300 MG capsule TAKE 1 CAPSULE BY MOUTH THREE TIMES A DAY   insulin aspart (NOVOLOG FLEXPEN) 100 UNIT/ML FlexPen Inject 10 Units into the skin 3 (three) times daily with meals. May titrate up to 16 units into the skin 3 times daily with meal as directed by your provider.   insulin glargine-yfgn (SEMGLEE) 100 UNIT/ML Pen Inject 54 Units into the skin daily.   Insulin Pen Needle (BD PEN NEEDLE NANO 2ND GEN) 32G X 4 MM MISC Use to inject insulin a total of 4 times daily.   losartan (COZAAR) 100 MG tablet TAKE 1 TABLET BY MOUTH EVERY DAY   metroNIDAZOLE (METROCREAM) 0.75 % cream Apply topically 2 (two) times daily.   omeprazole (PRILOSEC) 20 MG capsule TAKE 1 CAPSULE BY MOUTH EVERY DAY   sertraline (ZOLOFT) 100 MG tablet TAKE 1  TABLET BY MOUTH EVERY DAY   Tiotropium Bromide Monohydrate (SPIRIVA RESPIMAT) 1.25 MCG/ACT AERS Inhale 2 puffs into the lungs daily.   VENTOLIN HFA 108 (90 Base) MCG/ACT inhaler Inhale 2 puffs into the lungs every 4 (four) hours as needed for wheezing or shortness of breath.   No facility-administered encounter medications on file as of 02/03/2024.  :   Review of Systems:  Out of a complete 14 point review of systems, all are reviewed and negative with the exception of these symptoms as listed below:  Review of Systems  Neurological:        Patient in room #5 and alone. Patient states she does well with her CPAP. Patient states she has history of stroke two years  ago and now she having numbness around her jaw bone. ESS-6 & FSS-55    Objective:  Neurological Exam  Physical Exam Physical Examination:   Vitals:   02/03/24 0952 02/03/24 1001  BP: (!) 155/89 (!) 142/78  Pulse: 83    General Examination: The patient is a very pleasant 58 y.o. female in no acute distress. She appears well-developed and well-nourished and well groomed.   HEENT: Normocephalic, atraumatic, pupils are equal, round and reactive to light, extraocular tracking is good without limitation to gaze excursion or nystagmus noted. Hearing is grossly intact. Face is symmetric with normal facial animation and normal facial sensation to light touch, pinprick and temperature as well as vibration sense. Speech is clear with no dysarthria noted. There is no hypophonia. There is no lip, neck/head, jaw or voice tremor. Neck is supple with full range of passive and active motion. There are no carotid bruits on auscultation. Oropharynx exam reveals: mild mouth dryness, adequate dental hygiene and mild airway crowding, tongue protrudes centrally in palate elevates symmetrically.    Chest: Clear to auscultation without wheezing, rhonchi or crackles noted.   Heart: S1+S2+0, regular and normal without murmurs, rubs or gallops noted.     Abdomen: Soft, non-tender and non-distended with normal bowel sounds appreciated on auscultation.   Extremities: There is no pitting edema in the distal lower extremities bilaterally.    Skin: Warm and dry without trophic changes noted.    Musculoskeletal: exam reveals left foot in a boot.    Neurologically:  Mental status: The patient is awake, alert and oriented in all 4 spheres. Her immediate and remote memory, attention, language skills and fund of knowledge are appropriate. There is no evidence of aphasia, agnosia, apraxia or anomia. Speech is clear with normal prosody and enunciation. Thought process is linear. Mood is normal and affect is normal.  Cranial nerves II - XII are as described above under HEENT exam.   Reflexes 2+ in the upper extremities and right leg,  toes downgoing bilaterally.    Motor exam: Normal bulk, strength and tone is noted. There is no resting or action tremor, Romberg is negative. Fine motor skills and coordination: grossly intact with finger taps, hand movements and rapid alternating patting in both upper extremities, normal foot taps on the right side, left foot in a boot.   Cerebellar testing: No dysmetria or intention tremor. There is no truncal or gait ataxia.  Normal finger-to-nose bilaterally, normal heel-to-shin with the right leg.  Some difficulty with the left due to boot in place. Sensory exam: intact to light touch in the upper and lower extremities.  Gait, station and balance: She stands easily. No veering to one side is noted. No leaning to one side is noted. Posture is age-appropriate and stance is narrow based. Gait shows normal stride length and normal pace. No problems turning are noted.  No shuffling, preserved arm swing.              Assessment and Plan:  In summary, Omelia Marquart is a 58 year old female with an underlying medical history of asthma, allergies, diabetes, hypertension, hyperlipidemia, prior strokelike symptoms, obesity and OSA,  who presents for evaluation of her intermittent lower face numbness bilaterally.  History and examination are not in keeping with hemiplegic migraines or stroke or TIA.  She is advised that she has an elevated blood pressure value and symptoms may tie in with blood pressure fluctuation.  She has multiple vascular risk factors.  She is compliant with her  AutoPap and advised to continue with the current machine at the current settings.  She is commended for treatment adherence.  We talked about the importance of maintaining healthy lifestyle and improvement of as well as management of chronic conditions such as diabetes, hypertension, and hyperlipidemia.  She has had suboptimal control of diabetes, blood pressure and cholesterol.  She is advised to make an appointment with her PCP and other specialists.  She is supposed to see an endocrinologist.  I suggested we proceed with a brain MRI with and without contrast to rule out a structural cause of this.  We can see him in follow-up in this clinic routinely for sleep apnea in about a year.  We will keep her posted as to her brain MRI results by phone call in the interim. For any acute symptoms, she is advised to go to the emergency room or call 911.This was an extended visit of over 45 minutes with copious record review involved, and considerable counseling and coordination of care. I answered all her questions today and the patient was in agreement. I plan to see her back after the sleep study is completed and encouraged her to call with any interim questions, concerns, problems or updates.

## 2024-02-04 NOTE — Telephone Encounter (Signed)
 Pt called back reporting that she is completely out of her current supply. Please advise, she says "this is urgent and she does not want to go through withdrawals"

## 2024-02-07 ENCOUNTER — Ambulatory Visit: Payer: Self-pay | Admitting: Endocrinology

## 2024-02-07 ENCOUNTER — Encounter: Payer: Self-pay | Admitting: Endocrinology

## 2024-02-07 VITALS — BP 128/72 | HR 91 | Resp 18 | Ht 65.0 in | Wt 235.2 lb

## 2024-02-07 DIAGNOSIS — R5382 Chronic fatigue, unspecified: Secondary | ICD-10-CM | POA: Diagnosis not present

## 2024-02-07 DIAGNOSIS — E1169 Type 2 diabetes mellitus with other specified complication: Secondary | ICD-10-CM | POA: Diagnosis not present

## 2024-02-07 DIAGNOSIS — Z8349 Family history of other endocrine, nutritional and metabolic diseases: Secondary | ICD-10-CM | POA: Diagnosis not present

## 2024-02-07 DIAGNOSIS — Z794 Long term (current) use of insulin: Secondary | ICD-10-CM

## 2024-02-07 DIAGNOSIS — E118 Type 2 diabetes mellitus with unspecified complications: Secondary | ICD-10-CM

## 2024-02-07 MED ORDER — NOVOLOG FLEXPEN 100 UNIT/ML ~~LOC~~ SOPN: 20.0000 [IU] | PEN_INJECTOR | Freq: Three times a day (TID) | SUBCUTANEOUS | 3 refills | Status: DC

## 2024-02-07 MED ORDER — INSULIN GLARGINE-YFGN 100 UNIT/ML ~~LOC~~ SOPN: 64.0000 [IU] | PEN_INJECTOR | Freq: Every day | SUBCUTANEOUS | 1 refills | Status: DC

## 2024-02-07 MED ORDER — EMPAGLIFLOZIN 25 MG PO TABS: 25.0000 mg | ORAL_TABLET | Freq: Every day | ORAL | 3 refills | Status: DC

## 2024-02-07 NOTE — Patient Instructions (Signed)
 Diabetes regimen Increase Semglee to 64 units daily in the morning. Increase NovoLog up to 20 units with meals 3 times a day plus sliding scale as follows.  Take 10 to 15 minutes before eating.  Okay to adjust NovoLog based on meal size from 10 to 20 units.  For a smaller meals take 10 units and the regular larger meal take up to 20 units.  Moderate Sliding Scale for blood sugar before eating. Blood Glucose        Insulin 60-150                     None 151-200                   3 units 201-250                   5 units 251-300                   7 units 301-350                   9 units 351-400                  11 units    >400                       12 units and call provider  3.  Start Jardiance 25 mg daily.  Will hydrate with water every day.  Will check lab for thyroid including antibodies for Hashimoto's thyroiditis.

## 2024-02-07 NOTE — Progress Notes (Signed)
 Outpatient Endocrinology Note [place] Omair Dettmer, MD   Patient's Name: Jessica Ponce    DOB: November 04, 1966    MRN: [id number]                                                    REASON OF VISIT: New consult for type 2 diabetes mellitus  REFERRING PROVIDER: Georganna Skeans, MD  PCP: Georganna Skeans, MD  HISTORY OF PRESENT ILLNESS:   Jessica Ponce is a 58 y.o. old female with past medical history listed below, is here for new consult for type 2 diabetes mellitus.   Pertinent Diabetes History: Patient is referred to endocrinology for further evaluation and management of uncontrolled type 2 diabetes mellitus.  Patient was diagnosed with type 2 diabetes mellitus several years ago, 15+ years ago, has been on insulin therapy for 5-7+ years at the time of initial consult.  She was diagnosed with gestational diabetes in 1999, then had prediabetes and ultimately developed type 2 diabetes mellitus over time.  She has uncontrolled type 2 diabetes mellitus with hemoglobin A1c in the range of 7.9-11.9 range in last few years.  Patient reports intolerance with metformin and Ozempic in the past.  She denies taking any other antidiabetic medication other than multidose insulin therapy in the past.  History of DKA or diabetes related hospitalizations: ? None, may be for hyperglycemia.  No history of diabetes ketoacidosis.  Previous diabetes education: Yes , long time ago.   Family h/o diabetes mellitus: none   Chronic Diabetes Complications : Retinopathy: no. Last ophthalmology exam was done on annually, 05/2022, following with ophthalmology regularly.  Nephropathy: no, on ACE/ARB / losartan Peripheral neuropathy: numbness / tingling, on gabapentin from neurology for facial numbness.  Coronary artery disease: no Stroke: TIA / ? stroke  Relevant comorbidities and cardiovascular risk factors: Obesity: yes Body mass index is 39.14 kg/m.  Hypertension: Yes  Hyperlipidemia : Yes, not on statin. Statin, increase sugars  and not feel good /statin intolerance, managed by cardiology/lipid clinic, planning for Repatha.  Current / Home Diabetic regimen includes:  Semglee / glargine 54 units daily in the morning.  Novolog 10-15 units with meals 3 times a day, before meals.  Not fully compliant.  Prior diabetic medications: Metformin XR / regular GI intolerance. Ozempic caused severe sickness / N/V.   Glycemic data:    CONTINUOUS GLUCOSE MONITORING SYSTEM (CGMS) INTERPRETATION: At today's visit, we reviewed CGM downloads. The full report is scanned in the media. Reviewing the CGM trends, blood glucose are as follows:  Dexcom G7 CGM-  Sensor Download (Sensor download was reviewed and summarized below.) Dates: February 25 to February 07, 2024, 14 days  Glucose Management Indicator: 9.6% Sensor Average: 262 SD 54 Sensor usage : 95%    Interpretation: -Mostly hyperglycemia with a blood sugar in the range of 250-350 range postprandially and also overnight.  Occasionally blood sugar trending down after the rains of 200 related to using NovoLog with meals.  No hypoglycemia.  Hypoglycemia: Patient has no hypoglycemic episodes. Patient has hypoglycemia awareness.  Factors modifying glucose control: 1.  Diabetic diet assessment: 3 meals a day. Snacks at bedtime.   2.  Staying active or exercising: Has planter fascitis on orthopedic boot.  Not able to exercise and walk at this time.  3.  Medication compliance: compliant most of the time.  Interval history  Patient presented for evaluation and management of uncontrolled type 2 diabetes mellitus.  Hemoglobin A1c was 10% in January.  She has occasional numbness and tingling of the feet.  She has been taking gabapentin from neurology for facial numbness.  CGM data as reviewed above and mostly hyperglycemia.  She reports compliance with Semglee however not fully compliant with NovoLog especially when going out and eating.  Ports multiple family member in family has  hypothyroidism, she had normal TSH of 3.7 in June 2023.  She would like to have detailed testing about thyroid.  She complains of fatigue, gradual weight gain.  REVIEW OF SYSTEMS As per history of present illness.   PAST MEDICAL HISTORY: Past Medical History:  Diagnosis Date   Allergy    Asthma    no current problems, does not use inhaler   Diabetes mellitus without complication (HCC)    type 2   Essential hypertension    Hyperlipidemia    Stroke (HCC)     PAST SURGICAL HISTORY: Past Surgical History:  Procedure Laterality Date   APPENDECTOMY  1985   exploratory laparoscopy     WISDOM TOOTH EXTRACTION      ALLERGIES: Allergies  Allergen Reactions   Valsartan Diarrhea   Ozempic (0.25 Or 0.5 Mg-Dose) [Semaglutide(0.25 Or 0.5mg -Dos)] Nausea And Vomiting    Multiple episodes of severe vomiting on Ozempic   Atorvastatin Other (See Comments)    Elevated sugars   Codeine Nausea And Vomiting   Metformin And Related Diarrhea    FAMILY HISTORY:  Family History  Problem Relation Age of Onset   Memory loss Mother    Breast cancer Mother 55   Stroke Father    Breast cancer Maternal Aunt 74   Colon cancer Neg Hx    Rectal cancer Neg Hx    Stomach cancer Neg Hx    Esophageal cancer Neg Hx     SOCIAL HISTORY: Social History   Socioeconomic History   Marital status: Married    Spouse name: [name]   Number of children: 2   Years of education: 16   Highest education level: Bachelor's degree (e.g., BA, AB, BS)  Occupational History   Occupation: Self Employed  Tobacco Use   Smoking status: Never    Passive exposure: Never   Smokeless tobacco: Never  Vaping Use   Vaping status: Never Used  Substance and Sexual Activity   Alcohol use: Yes    Alcohol/week: 0.0 standard drinks of alcohol    Comment: occasional wine   Drug use: No   Sexual activity: Yes    Birth control/protection: Post-menopausal  Other Topics Concern   Not on file  Social History Narrative    Lives with husband and 2 kids   Caffeine use: Drinks 2-3 cups coffee per day   Social Drivers of Health   Financial Resource Strain: Medium Risk (12/09/2023)   Overall Financial Resource Strain (CARDIA)    Difficulty of Paying Living Expenses: Somewhat hard  Food Insecurity: Food Insecurity Present (12/09/2023)   Hunger Vital Sign    Worried About Running Out of Food in the Last Year: Sometimes true    Ran Out of Food in the Last Year: Sometimes true  Transportation Needs: No Transportation Needs (12/09/2023)   PRAPARE - Administrator, Civil Service (Medical): No    Lack of Transportation (Non-Medical): No  Physical Activity: Insufficiently Active (12/09/2023)   Exercise Vital Sign    Days of Exercise per Week: 2 days  Minutes of Exercise per Session: 20 min  Stress: Stress Concern Present (12/09/2023)   [employer] of Occupational Health - Occupational Stress Questionnaire    Feeling of Stress : Rather much  Social Connections: Socially Integrated (12/09/2023)   Social Connection and Isolation Panel [NHANES]    Frequency of Communication with Friends and Family: Twice a week    Frequency of Social Gatherings with Friends and Family: Once a week    Attends Religious Services: 1 to 4 times per year    Active Member of [employer] or Organizations: No    Attends Engineer, structural: More than 4 times per year    Marital Status: Married    MEDICATIONS:  Current Outpatient Medications  Medication Sig Dispense Refill   albuterol (PROVENTIL) (2.5 MG/3ML) 0.083% nebulizer solution Take 2.5 mg by nebulization every 4 (four) hours as needed for shortness of breath or wheezing.     amLODipine (NORVASC) 10 MG tablet TAKE 1 TABLET BY MOUTH EVERY DAY 90 tablet 1   aspirin EC 81 MG tablet Take 1 tablet (81 mg total) by mouth daily. Swallow whole. 30 tablet 12   budesonide-formoterol (SYMBICORT) 160-4.5 MCG/ACT inhaler Inhale 2 puffs into the lungs in the morning and at bedtime.  30.6 g 12   buPROPion (WELLBUTRIN XL) 150 MG 24 hr tablet TAKE 1 TABLET BY MOUTH EVERY DAY 90 tablet 0   Continuous Glucose Sensor (DEXCOM G7 SENSOR) MISC Use to check blood glucose throughout the day. Changes sensors once every 10 days. 3 each 6   doxycycline (VIBRAMYCIN) 50 MG capsule Take 1 capsule (50 mg total) by mouth daily. 30 capsule 5   empagliflozin (JARDIANCE) 25 MG TABS tablet Take 1 tablet (25 mg total) by mouth daily before breakfast. 90 tablet 3   ezetimibe (ZETIA) 10 MG tablet TAKE 1 TABLET BY MOUTH EVERY DAY 90 tablet 1   gabapentin (NEURONTIN) 300 MG capsule TAKE 1 CAPSULE BY MOUTH THREE TIMES A DAY 84 capsule 1   Insulin Pen Needle (BD PEN NEEDLE NANO 2ND GEN) 32G X 4 MM MISC Use to inject insulin a total of 4 times daily. 400 each 2   losartan (COZAAR) 100 MG tablet TAKE 1 TABLET BY MOUTH EVERY DAY 90 tablet 1   metroNIDAZOLE (METROCREAM) 0.75 % cream Apply topically 2 (two) times daily. 45 g 3   omeprazole (PRILOSEC) 20 MG capsule TAKE 1 CAPSULE BY MOUTH EVERY DAY 90 capsule 1   sertraline (ZOLOFT) 100 MG tablet TAKE 1 TABLET BY MOUTH EVERY DAY 90 tablet 1   Tiotropium Bromide Monohydrate (SPIRIVA RESPIMAT) 1.25 MCG/ACT AERS Inhale 2 puffs into the lungs daily. 12 g 0   VENTOLIN HFA 108 (90 Base) MCG/ACT inhaler Inhale 2 puffs into the lungs every 4 (four) hours as needed for wheezing or shortness of breath. 1 each 2   insulin aspart (NOVOLOG FLEXPEN) 100 UNIT/ML FlexPen Inject 20 Units into the skin 3 (three) times daily with meals. Plus sliding scale as instructed. 30 mL 3   insulin glargine-yfgn (SEMGLEE) 100 UNIT/ML Pen Inject 64 Units into the skin daily. 45 mL 1   No current facility-administered medications for this visit.    PHYSICAL EXAM: Vitals:   02/07/24 0901  BP: 128/72  Pulse: 91  Resp: 18  SpO2: 95%  Weight: 235 lb 3.2 oz (106.7 kg)  Height: 5\' 5"  (1.651 m)   Body mass index is 39.14 kg/m.  Wt Readings from Last 3 Encounters:  02/07/24 235 lb 3.2 oz  (  106.7 kg)  02/03/24 227 lb (103 kg)  02/02/24 233 lb (105.7 kg)    General: Well developed, well nourished female in no apparent distress.  HEENT: AT/[place], no external lesions.  Eyes: Conjunctiva clear and no icterus. Neck: Neck supple  Lungs: Respirations not labored Neurologic: Alert, oriented, normal speech Extremities / Skin: Dry. No sores or rashes noted.  Psychiatric: Does not appear depressed or anxious  Diabetic Foot Exam - Simple   No data filed    LABS Reviewed Lab Results  Component Value Date   HGBA1C 10.0 (A) 12/13/2023   HGBA1C 10.4 (A) 09/17/2023   HGBA1C 10.3 (A) 05/17/2023   No results found for: "FRUCTOSAMINE" Lab Results  Component Value Date   CHOL 267 (H) 10/04/2023   HDL 66 10/04/2023   LDLCALC 153 (H) 10/04/2023   TRIG 266 (H) 10/04/2023   CHOLHDL 4.0 10/04/2023   Lab Results  Component Value Date   MICRALBCREAT 7 05/14/2023   MICRALBCREAT 8 04/19/2020   Lab Results  Component Value Date   CREATININE 0.67 10/01/2023   No results found for: "GFR"  ASSESSMENT / PLAN  1. Diabetes mellitus type 2 with complications (HCC)   2. Type 2 diabetes mellitus with other specified complication, without long-term current use of insulin (HCC)   3. Chronic fatigue   4. Family history of thyroid disease     Diabetes Mellitus type 2, complicated by stroke/peripheral neuropathy. - Diabetic status / severity: Uncontrolled.  Lab Results  Component Value Date   HGBA1C 10.0 (A) 12/13/2023    - Hemoglobin A1c goal : <6.5%  Discussed about type 2 diabetes mellitus and potential chronic complications including diabetic retinopathy, neuropathy and nephropathy.  Discussed about importance of controlling blood sugar to prevent these complications.  Patient has remained uncontrolled diabetes for few years.  She reports intolerance to metformin and Ozempic in the past.  She will benefit from weight loss diabetic medication including GLP-1 receptor agonist,  however with the prior severe side effect from Ozempic will not try at this time.  Discussed about compliance with diabetes regimen including insulin with all the meals.  She has mostly hyperglycemia on CGM.  - Medications: See below. Diabetes regimen Increase Semglee to 64 units daily in the morning. Increase NovoLog up to 20 units with meals 3 times a day plus sliding scale as follows.  Take 10 to 15 minutes before eating.  Okay to adjust NovoLog based on meal size from 10 to 20 units.  For a smaller meals take 10 units and the regular larger meal take up to 20 units.  Moderate Sliding Scale for blood sugar before eating. Blood Glucose        Insulin 60-150                     None 151-200                   3 units 201-250                   5 units 251-300                   7 units 301-350                   9 units 351-400                  11 units    >400  12 units and call provider  3.  Start Jardiance 25 mg daily.  Will hydrate with water every day.  - Home glucose testing: Continue Dexcom G7 and check as needed. - Discussed/ Gave Hypoglycemia treatment plan.  # Consult : Offered dietitian visit, patient declined.  # Annual urine for microalbuminuria/ creatinine ratio, no microalbuminuria currently, continue ACE/ARB /losartan. Last  Lab Results  Component Value Date   MICRALBCREAT 7 05/14/2023    # Foot check nightly / neuropathy, on gabapentin from neurology.  # Annual dilated diabetic eye exams.   - Diet: Make healthy diabetic food choices.  Discussed in detail about diet plan including avoiding snacks, limiting carbohydrates, and increase amount of vegetables and grains. - Life style / activity / exercise: Discussed.  2. Blood pressure  -  BP Readings from Last 1 Encounters:  02/07/24 128/72    - Control is in target.  - No change in current plans.  3. Lipid status / Hyperlipidemia - Last  Lab Results  Component Value Date   LDLCALC  153 (H) 10/04/2023   -Following with cardiology and lipid clinic, planning for Repatha. ?  Had statin intolerance in the past.  # Patient complains of chronic fatigue and family history of hypothyroidism and multiple family members. -She had normal TSH of 3.7 in June 2023.  Patient wants to have full thyroid testings. -Will check for thyroid antibodies for potential Hashimoto thyroiditis and thyroid function test today. -That Hashimoto thyroiditis if present is an autoimmune disorder, does not need treatment by itself unless she develops hypothyroidism.  Diagnoses and all orders for this visit:  Diabetes mellitus type 2 with complications (HCC) -     empagliflozin (JARDIANCE) 25 MG TABS tablet; Take 1 tablet (25 mg total) by mouth daily before breakfast.  Type 2 diabetes mellitus with other specified complication, without long-term current use of insulin (HCC) -     insulin glargine-yfgn (SEMGLEE) 100 UNIT/ML Pen; Inject 64 Units into the skin daily. -     insulin aspart (NOVOLOG FLEXPEN) 100 UNIT/ML FlexPen; Inject 20 Units into the skin 3 (three) times daily with meals. Plus sliding scale as instructed.  Chronic fatigue -     T4, free -     TSH -     Thyroid peroxidase antibody -     Thyroglobulin antibody  Family history of thyroid disease -     T4, free -     TSH -     Thyroid peroxidase antibody -     Thyroglobulin antibody   DISPOSITION Follow up in clinic in 6 weeks suggested.   All questions answered and patient verbalized understanding of the plan.  [place] Justin Meisenheimer, MD [facility] Endocrinology [facility] [address] [place], [address] [place], [place] [address] Phone # [phone]  At least part of this note was generated using voice recognition software. Inadvertent word errors may have occurred, which were not recognized during the proofreading process.

## 2024-02-07 NOTE — Telephone Encounter (Signed)
 Pt was scheduled with Dr. Frances Furbish.

## 2024-02-07 NOTE — Telephone Encounter (Signed)
 Was this patient ever scheduled with an MD?

## 2024-02-08 ENCOUNTER — Encounter: Payer: Self-pay | Admitting: Physical Therapy

## 2024-02-08 ENCOUNTER — Ambulatory Visit: Admitting: Physical Therapy

## 2024-02-08 DIAGNOSIS — M79672 Pain in left foot: Secondary | ICD-10-CM

## 2024-02-08 DIAGNOSIS — M6281 Muscle weakness (generalized): Secondary | ICD-10-CM

## 2024-02-08 DIAGNOSIS — R262 Difficulty in walking, not elsewhere classified: Secondary | ICD-10-CM

## 2024-02-08 DIAGNOSIS — M25511 Pain in right shoulder: Secondary | ICD-10-CM

## 2024-02-08 LAB — THYROGLOBULIN ANTIBODY: Thyroglobulin Ab: <1 [IU]/mL (ref <=1)

## 2024-02-08 LAB — T4, FREE: Free T4: 0.8 ng/dL (ref 0.8–1.8)

## 2024-02-08 LAB — TSH: TSH: 4.74 m[IU]/L — ABNORMAL HIGH (ref 0.40–4.50)

## 2024-02-08 LAB — THYROID PEROXIDASE ANTIBODY: Thyroperoxidase Ab SerPl-aCnc: 2 [IU]/mL (ref <9)

## 2024-02-08 NOTE — Patient Instructions (Signed)
  Trigger Point Dry Needling  What is Trigger Point Dry Needling (DN)? DN is a physical therapy technique used to treat muscle pain and dysfunction. Specifically, DN helps deactivate muscle trigger points (muscle knots).  A thin filiform needle is used to penetrate the skin and stimulate the underlying trigger point. The goal is for a local twitch response (LTR) to occur and for the trigger point to relax. No medication of any kind is injected during the procedure.   What Does Trigger Point Dry Needling Feel Like?  The procedure feels different for each individual patient. Some patients report that they do not actually feel the needle enter the skin and overall the process is not painful. Very mild bleeding may occur. However, many patients feel a deep cramping in the muscle in which the needle was inserted. This is the local twitch response.   How Will I feel after the treatment? Soreness is normal, and the onset of soreness may not occur for a few hours. Typically this soreness does not last longer than two days.  Bruising is uncommon, however; ice can be used to decrease any possible bruising.  In rare cases feeling tired or nauseous after the treatment is normal. In addition, your symptoms may get worse before they get better, this period will typically not last longer than 24 hours.   What Can I do After My Treatment? Increase your hydration by drinking more water for the next 24 hours.  You may place ice or heat on the areas treated that have become sore, however, do not use heat on inflamed or bruised areas. Heat often brings more relief post needling. You can continue your regular activities, but vigorous activity is not recommended initially after the treatment for 24 hours. DN is best combined with other physical therapy such as strengthening, stretching, and other therapies.   What are the complications? While your therapist has had extensive training in minimizing the risks of  trigger point dry needling, it is important to understand the risks of any procedure.  Risks include bleeding, pain, fatigue, hematoma, infection, vertigo, nausea or nerve involvement. Monitor for any changes to your skin or sensation. Contact your therapist or MD with concerns.  A rare but serious complication is a pneumothorax over or near your middle and upper chest and back If you have dry needling in this area, monitor for the following symptoms: Shortness of breath on exertion and/or Difficulty taking a deep breath and/or Chest Pain and/or A dry cough If any of the above symptoms develop, please go to the nearest emergency room or call 911. Tell them you had dry needling over your thorax and report any symptoms you are having. Please follow-up with your treating therapist after you complete the medical evaluation.  Garen Lah, PT, [facility] Certified Exercise Expert for the Aging Adult  02/08/24 10:21 AM Phone: [phone] Fax: [phone]

## 2024-02-08 NOTE — Therapy (Signed)
 OUTPATIENT PHYSICAL THERAPY LOWER EXTREMITY EVALUATION   Patient Name: Jessica Ponce MRN: [id number] DOB:Jan 20, 1966, 58 y.o., female Today's Date: 02/08/2024  END OF SESSION:  PT End of Session - 02/08/24 1013     Visit Number 2    Number of Visits 13    Date for PT Re-Evaluation 03/16/24    Authorization Type [employer]    PT Start Time 1015    PT Stop Time 1100    PT Time Calculation (min) 45 min    Activity Tolerance Patient limited by pain    Behavior During Therapy WFL for tasks assessed/performed              Past Medical History:  Diagnosis Date   Allergy    Asthma    no current problems, does not use inhaler   Diabetes mellitus without complication (HCC)    type 2   Essential hypertension    Hyperlipidemia    Stroke [facility])    Past Surgical History:  Procedure Laterality Date   APPENDECTOMY  1985   exploratory laparoscopy     WISDOM TOOTH EXTRACTION     Patient Active Problem List   Diagnosis Date Noted   Anxiety 08/26/2023   Family history of malignant melanoma of skin 08/01/2023   Complicated migraine 03/21/2023   Acute ischemic stroke (HCC) 05/27/2022   Type 2 diabetes mellitus with hyperglycemia (HCC) 10/07/2021   SVT (supraventricular tachycardia) (HCC) 10/07/2021   Family history of thyroid disorder 07/15/2020   Mixed hyperlipidemia 04/23/2020   Family history of breast cancer 03/11/2020   Type 2 diabetes mellitus (HCC) 03/11/2020   Hypertension, essential 07/31/2016   Family history of Alzheimer's disease 08/04/2009    PCP: Georganna Skeans, MD   REFERRING PROVIDER: Edwin Cap, DPM   REFERRING DIAG: M72.2 (ICD-10-CM) - Plantar fasciitis of left foot   THERAPY DIAG:  Pain in left foot  Muscle weakness (generalized)  Difficulty in walking, not elsewhere classified  Acute pain of right shoulder  Rationale for Evaluation and Treatment: Rehabilitation  ONSET DATE: Approximately 6 months and worsening  SUBJECTIVE:    SUBJECTIVE STATEMENT:  If I wear the boot all day and I have pain and then I have pain in my knee as well. When I dont wear the boot I have no pain in my knee.  When I walk a lot it is starting to throb .  My pain now is a 1/10.  But I would like to try dry needling    EVAL-   I have had this pain for about 6months and it was getting unbearable.  I just was put into a walking boot.  Dr Lilian Kapur wants me to do PT for about 6 weeks and then re evaluate.  I  have trouble walking, not really had stairs to walk on.  Without the boot I am so painful.  With the boot I feel awkward and it bothers my knees. My pain is unbearable toward end of day and I can only tolerate about 5 min of standing or walking  PERTINENT HISTORY: DM , HTN Asthma, Hyperlipidemia, appt 1985, Hs of frozen shld R, obesity PAIN:  Are you having pain? Yes: NPRS scale: at rest 1/10 but when I stand or walk 7/10 at the end of the day with the boot and without the boot 9/10 Pain location: Left heel Pain description: left heel plantar medial band of plantar fascia Aggravating factors: moving my foot. I can stand for about 5 min  before I start feeling pain. Can't shop and household chores Relieving factors: walking boot  PRECAUTIONS: Other: Valsarten , mold dust, pollen, codeine, metformin Atorvastatin, ozempic  RED FLAGS: None   WEIGHT BEARING RESTRICTIONS: No  FALLS:  Has patient fallen in last 6 months? 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  DIAGNOSTIC FINDINGS: Foot X-ray: Pes cavus, no calcaneal spur, no fracture or stress fracture (01/25/2024)  per Dr Katina Dung  PATIENT SURVEYS:  LEFS 39/80   48.8%  COGNITION: Overall cognitive status: Within functional limits for tasks assessed     SENSATION: WFL  EDEMA:  NT  MUSCLE LENGTH: Hamstrings:Bil tightness   POSTURE: rounded shoulders, forward head, anterior pelvic tilt, and obesity and pes cavus bil  PALPATION: TTP left heel plantar medial band of plantar fascia   LOWER EXTREMITY ROM:  Active ROM Right eval Left eval  Hip flexion    Hip extension    Hip abduction    Hip adduction    Hip internal rotation    Hip external rotation    Knee flexion 125 130  Knee extension 0 0  Ankle dorsiflexion 10 5  Ankle plantarflexion 51 50  Ankle inversion 42 40  Ankle eversion 30 29   (Blank rows = not tested)  LOWER EXTREMITY MMT:  MMT Right eval Left eval  Hip flexion 4+ 4+  Hip extension 4 4  Hip abduction 4 4-  Hip adduction    Hip internal rotation    Hip external rotation    Knee flexion 5 5  Knee extension 5 4+  Ankle dorsiflexion    Ankle plantarflexion    Ankle inversion    Ankle eversion 15/25 0/25   (Blank rows = not tested)  LOWER EXTREMITY SPECIAL TESTS:  NT  FUNCTIONAL TESTS:  5 times sit to stand: 18.06 sec  2 minute walk test: 354.6FT   (517ft Norm) using in boot   4/10 at end of 2 min   SL balance on R is 23 sec L in boot3 sec GAIT: Distance walked: 5 times sit to stand: 18.06 sec  2 minute walk test: 354.6FT   (565ft Norm) using in boot Assistive device utilized: None Level of assistance: Complete Independence Comments: Pt unsteady due to pain in left foot in walking boot       [facility] Adult PT Treatment:                                                DATE: 02-08-24 Therapeutic Exercise: Seated heel raise and toe raise Toe Yoga Plantar fascial stretch Great toe extension stretch Heel  raise with arms on wall 3 x 10 bil. Sit to stand with sink support  Gastroc stretch on L and R 30 sec x 2 each Supine Bridge x 10 Manual Therapy: Trigger Point Dry Needling STW of gastroc Left IASTYM  of left plantar fascia Initial Treatment: Pt instructed on Dry Needling rational, procedures, and possible side effects. Pt instructed to expect mild to moderate muscle soreness later in the day and/or into the next day.  Pt instructed in methods to reduce muscle soreness. Pt instructed to continue prescribed HEP. Because Dry Needling was performed over or adjacent to a lung field, pt was educated on S/S of pneumothorax and to seek immediate medical attention should they occur.  Patient was educated on signs and symptoms of infection and other risk factors and advised to seek medical attention should they occur.  Patient verbalized understanding of these instructions and education.   Patient Verbal Consent Given: Yes Education Handout Provided: Yes Muscles Treated: quadratus plantae Left foot. Lateral gastroc Electrical Stimulation Performed: No Treatment Response/Outcome: Twitch response and decreased muscle tension   Neuromuscular re-ed: SLS on R and L attempting to 30 sec x 3 on each side Bil stretch of gastroc off step  SLS on R and L on there ex 15 sec x 4  pt with throbbing in left plantar fascia                                                                                                                   TREATMENT DATE: 02-01-24  Eval and issue HEP    PATIENT EDUCATION:  Education details: POC Explanation of findings, issue HEP Person educated: Patient Education method: Explanation, Demonstration, Tactile cues, Verbal cues, and Handouts Education comprehension: verbalized understanding, returned demonstration, verbal cues required, and needs further education  HOME EXERCISE PROGRAM: Access Code: [email or web address] URL: [email or web address] Date: 02/01/2024 Prepared  by: Garen Lah  Exercises - Heel raise with counter support and towel under toes  - 1 x daily - 7 x weekly - 3 sets - 10 reps - Single Leg Stance  - 1 x daily - 7 x weekly - 30 to 60 seconds hold - Sit to stand with sink support Movement snack  - 1 x daily - 7 x weekly - 3 sets - 10 reps - Seated Heel Raise  - 1 x daily - 7 x weekly - 1-2 sets - 10 reps - Seated Plantar Fascia Stretch  - 2 x daily - 7 x weekly - 1 sets - 3 reps - 30 second hold - Seated Self Great Toe Stretch  - 1 x daily - 7 x weekly - 1 sets - 10-15 reps - Gastroc Stretch on Wall  - 1 x daily - 7 x weekly - 3 sets - 10 reps - Supine Bridge  - 1 x daily - 7 x weekly - 3 sets - 10 reps  ASSESSMENT:  CLINICAL IMPRESSION:  Pt returns to clinic with 1/10 pain and is willing to try TPDN and is closely monitored throughout session.  Pt is 2-3/10 at end of session but was able to perform exercise with increased load in session.  Pt is continuing to wear walking boot until next MD visit and to continue exercises out of boot and  to build resilience  EVAL- Patient is a 58 y.o. femsle who was seen today for physical therapy evaluation and treatment for Left plantar fasciitis.  Pt enters clinic with walking boot on left and unable to tolerate longer than 5 min standing without pain to 7/10  Pt reports 9/10 pain without walking boot.  Pt will benefit from skilled PT to address limitations in walking and daily activities to return to maximize functional mobility.   OBJECTIVE IMPAIRMENTS: decreased activity tolerance, decreased endurance, difficulty walking, decreased ROM, decreased strength, obesity, and pain.   ACTIVITY LIMITATIONS: standing, squatting, stairs, and locomotion level  PARTICIPATION LIMITATIONS: meal prep, cleaning, laundry, shopping, community activity, and substitute teaching  PERSONAL FACTORS: M72.2 (ICD-10-CM) - Plantar fasciitis of left foot  are also affecting patient's functional outcome.   REHAB POTENTIAL:  Good  CLINICAL DECISION MAKING: Evolving/moderate complexity  EVALUATION COMPLEXITY: Moderate   GOALS: Goals reviewed with patient? Yes  SHORT TERM GOALS: Target date: 02-22-24 Pt will be independent with initial HEP Baseline:no knowledge Goal status: INITIAL  2.  Pt with be educated on home walking program Baseline: no knowledge Goal status: INITIAL  3.  Pt pain level in boot will reduce to 5/10 Baseline: Pt in boot max 7/10 and out of boot 9/10 Goal status: INITIAL    LONG TERM GOALS: Target date: 03-16-24  Pt will be independent with advanced HEP Baseline: no knowledge Goal status: INITIAL  2.  Pt will be able to perform household chores with minimal pain for 30 minutes Baseline: Pt unable to stand for 5 min without 7-9/10 pain Goal status: INITIAL  3.  Pt will be able to perform 6 MWT within normal score Baseline: 5 times sit to stand: 18.06 sec  2 minute walk test: 354.6FT   (569ft Norm) using in boot Goal status: INITIAL  4.  Pt will be consistent with home walking program at least 25 min a day with minimal pain  Baseline: unable to walk for exercise over 5 min Goal status: INITIAL  5.  Pt will be able to wean to a normal tennis show with minimal pain Baseline: unable to walk without walking boot Goal status: INITIAL  6.  Pt LEFS will improve to at least 51/80 to show improved functional change Baseline: eval LEFS 39/80   48.8% Goal status: INITIAL  7.  Pt will be able to perform standing SL heel raise on LT 15/25 x to show improved tolerance and strength  Baseline: L SL  0/25  Goal status: INITIAL   PLAN:  PT FREQUENCY: 1-2x/week  PT DURATION: 6 weeks  PLANNED INTERVENTIONS: 97164- PT Re-evaluation, 97110-Therapeutic exercises, 97530- Therapeutic activity, 97112- Neuromuscular re-education, 97535- Self Care, 47829- Manual therapy, [phone]- Gait training, [phone]- Electrical stimulation (unattended), [phone]- Electrical stimulation (manual), Patient/Family  education, Balance training, Stair training, Taping, Dry Needling, Joint mobilization, Cryotherapy, and Moist heat  PLAN FOR NEXT SESSION: TPDN, Manual , HEP   Garen Lah, PT, ATR[facility] Certified Exercise Expert for the Aging Adult  02/08/24 2:16 PM Phone: [phone] Fax: [phone]   For all possible CPT codes, reference the Planned Interventions line above.     Check all conditions that are expected to impact treatment: {Conditions expected to impact treatment:Musculoskeletal disorders   If treatment provided at initial evaluation, no treatment charged due to lack of authorization.

## 2024-02-09 ENCOUNTER — Telehealth: Payer: Self-pay

## 2024-02-09 ENCOUNTER — Encounter: Payer: Self-pay | Admitting: Endocrinology

## 2024-02-09 ENCOUNTER — Other Ambulatory Visit: Payer: Self-pay | Admitting: Endocrinology

## 2024-02-09 DIAGNOSIS — E038 Other specified hypothyroidism: Secondary | ICD-10-CM

## 2024-02-09 MED ORDER — LEVOTHYROXINE SODIUM 25 MCG PO TABS: 25.0000 ug | ORAL_TABLET | Freq: Every day | ORAL | 3 refills | Status: DC

## 2024-02-09 NOTE — Therapy (Incomplete)
 OUTPATIENT PHYSICAL THERAPY LOWER EXTREMITY TREATMENT   Patient Name: Jessica Ponce MRN: [id number] DOB:1966-02-15, 58 y.o., female Today's Date: 02/09/2024  END OF SESSION:     Past Medical History:  Diagnosis Date   Allergy    Asthma    no current problems, does not use inhaler   Diabetes mellitus without complication (HCC)    type 2   Essential hypertension    Hyperlipidemia    Stroke [facility])    Past Surgical History:  Procedure Laterality Date   APPENDECTOMY  1985   exploratory laparoscopy     WISDOM TOOTH EXTRACTION     Patient Active Problem List   Diagnosis Date Noted   Anxiety 08/26/2023   Family history of malignant melanoma of skin 08/01/2023   Complicated migraine 03/21/2023   Acute ischemic stroke (HCC) 05/27/2022   Type 2 diabetes mellitus with hyperglycemia (HCC) 10/07/2021   SVT (supraventricular tachycardia) (HCC) 10/07/2021   Family history of thyroid disorder 07/15/2020   Mixed hyperlipidemia 04/23/2020   Family history of breast cancer 03/11/2020   Type 2 diabetes mellitus (HCC) 03/11/2020   Hypertension, essential 07/31/2016   Family history of Alzheimer's disease 08/04/2009    PCP: Georganna Skeans, MD   REFERRING PROVIDER: Edwin Cap, DPM   REFERRING DIAG: M72.2 (ICD-10-CM) - Plantar fasciitis of left foot   THERAPY DIAG:  No diagnosis found.  Rationale for Evaluation and Treatment: Rehabilitation  ONSET DATE: Approximately 6 months and worsening  SUBJECTIVE:   SUBJECTIVE STATEMENT:  If I wear the boot all day and I have pain and then I have pain in my knee as well. When I dont wear the boot I have no pain in my knee.  When I walk a lot it is starting to throb .  My pain now is a 1/10.  But I would like to try dry needling    EVAL-   I have had this pain for about 6months and it was getting unbearable.  I just was put into a walking boot.  Dr Lilian Kapur wants me to do PT for about 6 weeks and then re evaluate.  I  have trouble  walking, not really had stairs to walk on.  Without the boot I am so painful.  With the boot I feel awkward and it bothers my knees. My pain is unbearable toward end of day and I can only tolerate about 5 min of standing or walking  PERTINENT HISTORY: DM , HTN Asthma, Hyperlipidemia, appt 1985, Hs of frozen shld R, obesity PAIN:  Are you having pain? Yes: NPRS scale: at rest 1/10 but when I stand or walk 7/10 at the end of the day with the boot and without the boot 9/10 Pain location: Left heel Pain description: left heel plantar medial band of plantar fascia Aggravating factors: moving my foot. I can stand for about 5 min before I start feeling pain. Can't shop and household chores Relieving factors: walking boot  PRECAUTIONS: Other: Valsarten , mold dust, pollen, codeine, metformin Atorvastatin, ozempic  RED FLAGS: None   WEIGHT BEARING RESTRICTIONS: No  FALLS:  Has patient fallen in last 6 months? 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  DIAGNOSTIC FINDINGS: Foot X-ray: Pes cavus, no calcaneal spur, no fracture or stress fracture (01/25/2024)  per Dr Katina Dung  PATIENT SURVEYS:  LEFS 39/80   48.8%  COGNITION: Overall cognitive status: Within functional limits for tasks assessed     SENSATION: WFL  EDEMA:  NT  MUSCLE LENGTH: Hamstrings:Bil tightness   POSTURE: rounded shoulders, forward head, anterior pelvic tilt, and obesity and pes cavus bil  PALPATION: TTP  left heel plantar medial band of plantar fascia   LOWER EXTREMITY ROM:  Active ROM Right eval Left eval  Hip flexion    Hip extension    Hip abduction    Hip adduction    Hip internal rotation    Hip external rotation    Knee flexion 125 130  Knee extension 0 0  Ankle dorsiflexion 10 5  Ankle plantarflexion 51 50  Ankle inversion 42 40  Ankle eversion 30 29   (Blank rows = not tested)  LOWER EXTREMITY MMT:  MMT Right eval Left eval  Hip flexion 4+ 4+  Hip extension 4 4  Hip abduction 4 4-  Hip adduction    Hip internal rotation    Hip external rotation    Knee flexion 5 5  Knee extension 5 4+  Ankle dorsiflexion    Ankle plantarflexion    Ankle inversion    Ankle eversion 15/25 0/25   (Blank rows = not tested)  LOWER EXTREMITY SPECIAL TESTS:  NT  FUNCTIONAL TESTS:  5 times sit to stand: 18.06 sec  2 minute walk test: 354.6FT   (55ft Norm) using in boot   4/10 at end of 2 min   SL balance on R is 23 sec L in boot3 sec GAIT: Distance walked: 5 times sit to stand: 18.06 sec  2 minute walk test: 354.6FT   (570ft Norm) using in boot Assistive device utilized: None Level of assistance: Complete Independence Comments: Pt unsteady due to pain in left foot in walking boot   [facility] Adult PT Treatment:                                                DATE: 02-10-24 Therapeutic Exercise: *** Manual Therapy: *** Neuromuscular re-ed: *** Therapeutic Activity: *** Modalities: *** Self Care: ***       [facility] Adult PT Treatment:                                                DATE: 02-08-24 Therapeutic Exercise: Seated heel raise and toe raise Toe Yoga Plantar fascial stretch Great toe extension stretch Heel raise with arms on wall 3 x 10 bil. Sit to stand with sink support  Gastroc stretch on L and R 30 sec x 2 each Supine Bridge x 10 Manual Therapy: Trigger Point Dry Needling STW of gastroc Left IASTYM of left plantar fascia Initial Treatment: Pt  instructed on Dry Needling rational, procedures, and possible side effects. Pt instructed to expect mild to moderate muscle soreness later in the day and/or into the next day.  Pt instructed in methods to reduce muscle soreness. Pt instructed to continue prescribed HEP. Because Dry Needling was performed over or adjacent to a lung field, pt was educated on S/S of pneumothorax and to seek immediate medical attention should they occur.  Patient was educated on signs and symptoms of infection and other risk factors and advised to seek medical attention should they occur.  Patient verbalized understanding of these instructions and education.   Patient Verbal Consent Given: Yes Education Handout Provided: Yes Muscles Treated: quadratus plantae Left foot. Lateral gastroc Electrical Stimulation Performed: No Treatment Response/Outcome: Twitch response and decreased muscle tension   Neuromuscular re-ed: SLS on R and L attempting to 30 sec x 3 on each side Bil stretch of gastroc off step  SLS on R and L on there ex 15 sec x 4  pt with throbbing in left plantar fascia                                                                                                                   TREATMENT DATE: 02-01-24  Eval and issue HEP    PATIENT EDUCATION:  Education details: POC Explanation of findings, issue HEP Person educated: Patient Education method: Explanation, Demonstration, Tactile cues, Verbal cues, and Handouts Education comprehension: verbalized understanding, returned demonstration, verbal cues required, and needs further education  HOME EXERCISE PROGRAM: Access Code: [email or web address] URL: [email or web address] Date: 02/01/2024 Prepared by: Garen Lah  Exercises - Heel raise with counter support and towel under toes  - 1 x daily - 7 x weekly - 3 sets - 10 reps - Single Leg Stance  - 1 x daily - 7 x weekly - 30 to 60 seconds hold - Sit to stand with sink support Movement snack   - 1 x daily - 7 x weekly - 3 sets - 10 reps - Seated Heel Raise  - 1 x daily - 7 x weekly - 1-2 sets - 10 reps - Seated Plantar Fascia Stretch  - 2 x daily - 7 x weekly - 1 sets - 3 reps - 30 second hold - Seated Self Great Toe Stretch  - 1 x daily - 7 x weekly - 1 sets - 10-15 reps - Gastroc Stretch on Wall  - 1 x daily - 7 x weekly - 3 sets - 10 reps - Supine Bridge  - 1 x daily - 7 x weekly - 3 sets - 10 reps  ASSESSMENT:  CLINICAL IMPRESSION:  Pt returns to clinic with 1/10 pain and is willing to try TPDN and is closely monitored throughout session.  Pt is 2-3/10 at end of session but was able to perform exercise with increased load in session.  Pt is continuing to wear walking boot until next MD visit and to continue exercises out of boot and to build resilience  EVAL- Patient is a 57 y.o. femsle who was seen today for physical therapy evaluation and treatment for Left plantar fasciitis.  Pt enters clinic with walking boot on left and unable  to tolerate longer than 5 min standing without pain to 7/10  Pt reports 9/10 pain without walking boot.  Pt will benefit from skilled PT to address limitations in walking and daily activities to return to maximize functional mobility.   OBJECTIVE IMPAIRMENTS: decreased activity tolerance, decreased endurance, difficulty walking, decreased ROM, decreased strength, obesity, and pain.   ACTIVITY LIMITATIONS: standing, squatting, stairs, and locomotion level  PARTICIPATION LIMITATIONS: meal prep, cleaning, laundry, shopping, community activity, and substitute teaching  PERSONAL FACTORS: M72.2 (ICD-10-CM) - Plantar fasciitis of left foot  are also affecting patient's functional outcome.   REHAB POTENTIAL: Good  CLINICAL DECISION MAKING: Evolving/moderate complexity  EVALUATION COMPLEXITY: Moderate   GOALS: Goals reviewed with patient? Yes  SHORT TERM GOALS: Target date: 02-22-24 Pt will be independent with initial HEP Baseline:no  knowledge Goal status: INITIAL  2.  Pt with be educated on home walking program Baseline: no knowledge Goal status: INITIAL  3.  Pt pain level in boot will reduce to 5/10 Baseline: Pt in boot max 7/10 and out of boot 9/10 Goal status: INITIAL    LONG TERM GOALS: Target date: 03-16-24  Pt will be independent with advanced HEP Baseline: no knowledge Goal status: INITIAL  2.  Pt will be able to perform household chores with minimal pain for 30 minutes Baseline: Pt unable to stand for 5 min without 7-9/10 pain Goal status: INITIAL  3.  Pt will be able to perform 6 MWT within normal score Baseline: 5 times sit to stand: 18.06 sec  2 minute walk test: 354.6FT   (510ft Norm) using in boot Goal status: INITIAL  4.  Pt will be consistent with home walking program at least 25 min a day with minimal pain  Baseline: unable to walk for exercise over 5 min Goal status: INITIAL  5.  Pt will be able to wean to a normal tennis show with minimal pain Baseline: unable to walk without walking boot Goal status: INITIAL  6.  Pt LEFS will improve to at least 51/80 to show improved functional change Baseline: eval LEFS 39/80   48.8% Goal status: INITIAL  7.  Pt will be able to perform standing SL heel raise on LT 15/25 x to show improved tolerance and strength  Baseline: L SL  0/25  Goal status: INITIAL   PLAN:  PT FREQUENCY: 1-2x/week  PT DURATION: 6 weeks  PLANNED INTERVENTIONS: 97164- PT Re-evaluation, 97110-Therapeutic exercises, 97530- Therapeutic activity, 97112- Neuromuscular re-education, 97535- Self Care, 16109- Manual therapy, [id number]- Gait training, [phone]- Electrical stimulation (unattended), [phone]- Electrical stimulation (manual), Patient/Family education, Balance training, Stair training, Taping, Dry Needling, Joint mobilization, Cryotherapy, and Moist heat  PLAN FOR NEXT SESSION: TPDN, Manual , HEP   ***   For all possible CPT codes, reference the Planned Interventions  line above.     Check all conditions that are expected to impact treatment: {Conditions expected to impact treatment:Musculoskeletal disorders   If treatment provided at initial evaluation, no treatment charged due to lack of authorization.

## 2024-02-09 NOTE — Telephone Encounter (Signed)
 Patient given results and medication changes as directed by MD. No further questions at this time.

## 2024-02-09 NOTE — Telephone Encounter (Signed)
-----   Message from [place] Thapa sent at 02/09/2024 10:15 AM EDT ----- Please notify patient of labs reviewed she has mildly low thyroid hormone levels.  She has symptoms suggestive of hypothyroidism.  I would like to start her on levothyroxine 25 mcg daily.  I have sent a prescription.  Will recheck lab at the time of follow-up visit.

## 2024-02-10 ENCOUNTER — Telehealth: Payer: Self-pay | Admitting: Neurology

## 2024-02-10 ENCOUNTER — Ambulatory Visit: Admitting: Physical Therapy

## 2024-02-10 NOTE — Telephone Encounter (Signed)
 [employer] [place]: [id number] exp. 02/10/24-04/09/24 sent to GI [phone]

## 2024-02-13 NOTE — Progress Notes (Deleted)
 S:     No chief complaint on file.  58 y.o. female who presents for diabetes evaluation, education, and management. PMH is significant for T2DM, stroke, asthma, long COVID sx, hyperlipidemia.   Patient was referred and last seen by Primary Care Provider, Dr. Andrey Campanile, on 01/11/24. Patient endorsed left foot pain. She is scheduled to meet with podiatry on 01/25/24. Was diagnosed as GDM ~58 YO. Was managed initially with insulin and then insulin was stopped when she delivered her son. She was tried on metformin after that. She has also tried Ozempic in the past. Was then placed on insulin again. Since being changed to Lantus, her sugars at home remain elevated.   Last saw pharmacy on 12/13/2023. A1c was 10.0. We increased Lantus to 54 units daily and started insulin aspart 5 units TID. Pt was seen by cardiology recently and PA was submitted for Repatha given statin intolerance (elevated BG) and premature ASCVD - however, PA was denied by [facility] because they require trial of ezetmibe first. We started her on ezetimibe 10 mg PO daily in January. At last pharmacy appt on 01/17/24 - we increased insulin aspart to 10 units with each meal with instructions to titrate by 2 units with each meal for 2-hr PPG > 180 mg/dL up to a maximum of 16 units with each meal. She saw Endo on 02/07/24 who increased insulin glargine to 64 units daily and insulin aspart to 20 units TID with meals + sliding scale. Instructed to take 10 units with smaller meals and 20 units with larger meals. Also started Jardiance 25 mg daily.   Today, patient presents in good spirits and without assistance. Reports Norovirus infection 2 weeks ago. She stopped taking insulin for about 2 days. Reports that BG were persistently elevated. She overall feels that she is having a lot of inflammation. Thinks her hands may be swelling due to arthritis, and she was recently started on another inhaler for uncontrolled asthma. She reports struggling with  weight loss, and being unable to control food cravings in the evening, but she was not able to tolerate Ozempic in the past.   Family/Social History:  Father- stroke Mother- breast cancer, memory loss  Current diabetes medications include: Lantus 54u daily, insulin aspart 5 units TID (typically takes twice daily with meals)  Current hypertension medications include: losartan 100 mg daily, amlodipine 10 mg daily Current hyperlipidemia medications include: ezetimibe 10 mg.  Previously tried hyperlipidemia medications include: atorvastatin and rosuvastatin (patient self discontinued due to elevated blood sugar)  Patient reports adherence to taking all medications as prescribed. Reports taking ezetimibe at night to reduce AE of upset stomach.  Insurance coverage: [place] Medicaid  Patient denies hypoglycemic events.   Patient denies nocturia (nighttime urination).  Patient reports hand swelling. Patient reports xerostomia. Patient endorse polyphagia when her BG is high. Patient reports neuropathy (nerve pain). Patient reports visual changes, but does wear glasses and follows with eye doctor. Notes that her vision changed with stroke. Patient reports self foot exams.   Patient reported dietary habits: Eats 2 meals/day  Breakfast: coffee Lunch: eggs, sausage, cheese Dinner: chicken thighs, tries to stay away from bread Snacks: does admit to snacking at night, cookies, trail mix more lately since she has had increased stress Drinks: water, sometimes puts flavoring in water which is low carb, very little soda  O:  Date of Download: 14 day download, 01/04/24-01/17/24 Average Glucose: 237 Glucose Management Indicator: 9.0% Time in Goal:  - Time in  range 70-180: 16% - Time above range: 84% - Time below range: 0%      Lab Results  Component Value Date   HGBA1C 10.0 (A) 12/13/2023   There were no vitals filed for this visit.  Lipid Panel     Component Value Date/Time   CHOL 267 (H)  10/04/2023 1536   TRIG 266 (H) 10/04/2023 1536   HDL 66 10/04/2023 1536   CHOLHDL 4.0 10/04/2023 1536   CHOLHDL 6.9 05/28/2022 0522   VLDL 56 (H) 05/28/2022 0522   LDLCALC 153 (H) 10/04/2023 1536   Clinical Atherosclerotic Cardiovascular Disease (ASCVD): Yes  The ASCVD Risk score (Arnett DK, et al., 2019) failed to calculate for the following reasons:   Risk score cannot be calculated because patient has a medical history suggesting prior/existing ASCVD   Patient is participating in a Managed Medicaid Plan: Yes   F/u Dexcom report Pioglitazone? A1c due April Continue insulin titration  Sliding scale:  60-150                     None 151-200                   3 units 201-250                   5 units 251-300                   7 units 301-350                   9 units 351-400                  11 units    >400                       12 units and call provider  A/P: Diabetes longstanding currently uncontrolled based on last A1c 10.4%. A1c goal <7. Clarity reports persistently elevated BG, noted spike around bedtime consistent with patient reported snacking in the evening.. Patient is able to verbalize appropriate hypoglycemia management plan. Medication adherence appears appropriate. Favor increasing prandial insulin today. Patient is not willing to retrial less potent GLP-1RA (like Trulicity) given severe vomiting with Ozempic in the past - though this would likely help to reduce food cravings and improve insulin sensitivity.  -Continued  Insulin Lantus 54 units daily. -Increased Insulin Aspart (Novolog) to 10 units TID. Educated patient to increase by 2 units with each meal every 3 days (up to a maximum of 16 units with meals) if BG remain above goal. Encouraged patient to take prandial insulin if she has a large carbohydrate-heavy snack prior to going to bed. -Patient educated on purpose, proper use, and potential adverse effects of Lantus.  -Extensively discussed pathophysiology of  diabetes, recommended lifestyle interventions, dietary effects on blood sugar control.  -Counseled on s/sx of and management of hypoglycemia.  -Next A1c anticipated 4/25.   Hyperlipidemia: LDL-C uncontrolled at 153 mg/dL above goal < 55 mg/dL given premature ASCVD (hx of stroke). Pt is intolerant to statins - has tried rosuvastatin and atorvastatin. She was started on ezetimibe as Medicaid requires evidence of failure to get Repatha approved. Will plan to recheck lipid panel at f/u appt.  - Continued ezetimibe 10 mg PO daily  Written patient instructions provided. Patient verbalized understanding of treatment plan.  Total time in face to face counseling 30 minutes.    Follow-up:  Endo: 03/22/24 PCP: 04/10/2024 Pharmacy:  1 mo ***  Nils Pyle, PharmD PGY1 Pharmacy Resident  Butch Penny, PharmD, BCACP, CPP Clinical Pharmacist [facility] [phone]

## 2024-02-14 ENCOUNTER — Ambulatory Visit: Payer: Medicaid Other | Admitting: Pharmacist

## 2024-02-14 ENCOUNTER — Other Ambulatory Visit: Payer: Self-pay | Admitting: Family Medicine

## 2024-02-15 ENCOUNTER — Ambulatory Visit: Admitting: Physical Therapy

## 2024-02-16 ENCOUNTER — Ambulatory Visit: Admitting: Neurology

## 2024-02-17 ENCOUNTER — Ambulatory Visit: Admitting: Physical Therapy

## 2024-02-17 NOTE — Therapy (Addendum)
 OUTPATIENT PHYSICAL THERAPY LOWER EXTREMITY TREATMENT/DISCHARGE NOTE PHYSICAL THERAPY DISCHARGE SUMMARY  Visits from Start of Care: 3  Current functional level related to goals / functional outcomes: unknown   Remaining deficits: unknown   Education / Equipment: Initial HEP   Patient agrees to discharge. Patient goals were not met. Patient is being discharged due to not returning since the last visit.    Patient Name: Jessica Ponce MRN: [id number] DOB:11-05-1966, 58 y.o., female Today's Date: 02/22/2024  END OF SESSION:  PT End of Session - 02/22/24 0842     Visit Number 3    Number of Visits 13    Date for PT Re-Evaluation 03/16/24    Authorization Type [employer]    PT Start Time 0845    PT Stop Time 0930    PT Time Calculation (min) 45 min    Activity Tolerance Patient limited by pain    Behavior During Therapy [facility] for tasks assessed/performed               Past Medical History:  Diagnosis Date   Allergy    Asthma    no current problems, does not use inhaler   Diabetes mellitus without complication (HCC)    type 2   Essential hypertension    Hyperlipidemia    Stroke [facility])    Past Surgical History:  Procedure Laterality Date   APPENDECTOMY  1985   exploratory laparoscopy     WISDOM TOOTH EXTRACTION     Patient Active Problem List   Diagnosis Date Noted   Anxiety 08/26/2023   Family history of malignant melanoma of skin 08/01/2023   Complicated migraine 03/21/2023   Acute ischemic stroke (HCC) 05/27/2022   Type 2 diabetes mellitus with hyperglycemia (HCC) 10/07/2021   SVT (supraventricular tachycardia) (HCC) 10/07/2021   Family history of thyroid  disorder 07/15/2020   Mixed hyperlipidemia 04/23/2020   Family history of breast cancer 03/11/2020   Type 2 diabetes mellitus (HCC) 03/11/2020   Hypertension, essential 07/31/2016   Family history of Alzheimer's disease 08/04/2009    PCP: Abraham Abo, MD   REFERRING PROVIDER: Floyce Hutching, DPM   REFERRING DIAG: M72.2 (ICD-10-CM) - Plantar fasciitis of left foot   THERAPY DIAG:  Pain in left foot  Muscle weakness (generalized)  Difficulty in walking, not elsewhere classified  Acute pain of right shoulder  Rationale for Evaluation and Treatment: Rehabilitation  ONSET DATE: Approximately 6 months and worsening  SUBJECTIVE:   SUBJECTIVE STATEMENT:   02-22-24 I had a sore throat last week so I was sorry to miss. My pain is a 3/10 today. I am still having knee pain with the boot.    If I wear the boot all day and I have pain and then I have pain in my knee as well. When I dont wear the boot I have no pain in my knee.  When I walk a lot it is starting to throb .  My pain now is a 1/10.  But I would like to try dry needling    EVAL-   I have had this pain for about 6months and it was getting unbearable.  I just was put into a walking boot.  Dr Michalene Agee wants me to do PT for about 6 weeks and then re evaluate.  I  have trouble walking, not really had stairs to walk on.  Without the boot I am so painful.  With the boot I feel awkward and it bothers my knees. My pain  is unbearable toward end of day and I can only tolerate about 5 min of standing or walking  PERTINENT HISTORY: DM , HTN Asthma, Hyperlipidemia, appt 1985, Hs of frozen shld R, obesity PAIN:  Are you having pain? Yes: NPRS scale: at rest 1/10 but when I stand or walk 7/10 at the end of the day with the boot and without the boot 9/10 Pain location: Left heel Pain description: left heel plantar medial band of plantar fascia Aggravating factors: moving my foot. I can stand for about 5 min before I start feeling pain. Can't shop and household chores Relieving factors: walking boot  PRECAUTIONS: Other: V[place]arten , mold dust, pollen, codeine, metformin Atorvastatin, ozempic   RED FLAGS: None   WEIGHT BEARING RESTRICTIONS: No  FALLS:  Has patient fallen in last 6 months? 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  DIAGNOSTIC FINDINGS: Foot X-ray: Pes cavus, no calcaneal spur, no fracture or stress fracture (01/25/2024)  per Dr Shannan Dart  PATIENT SURVEYS:  LEFS 39/80   48.8%  COGNITION: Overall cognitive status: Within functional limits for tasks assessed     SENSATION: WFL  EDEMA:  NT  MUSCLE LENGTH: Hamstrings:Bil tightness   POSTURE: rounded shoulders, forward head, anterior pelvic tilt, and obesity and pes cavus bil  PALPATION: TTP left heel plantar medial band of plantar fascia   LOWER EXTREMITY ROM:  Active ROM Right eval Left eval  Hip flexion    Hip extension    Hip abduction    Hip adduction    Hip internal rotation    Hip external rotation    Knee flexion 125 130  Knee extension 0 0  Ankle dorsiflexion 10 5  Ankle plantarflexion 51 50  Ankle inversion 42 40  Ankle eversion 30 29   (Blank rows = not tested)  LOWER EXTREMITY MMT:  MMT Right eval Left eval  Hip flexion 4+ 4+  Hip extension 4 4  Hip abduction 4 4-  Hip adduction    Hip internal rotation    Hip external rotation    Knee flexion 5 5  Knee extension 5 4+  Ankle dorsiflexion    Ankle plantarflexion    Ankle inversion    Ankle eversion 15/25 0/25   (Blank rows = not tested)  LOWER EXTREMITY SPECIAL TESTS:  NT  FUNCTIONAL TESTS:  5 times sit to stand: 18.06 sec  2 minute walk test: 354.6FT   (514ft Norm) using in boot  4/10 at end of 2 min   SL balance on R is 23 sec L in  boot3 sec GAIT: Distance walked: 5 times sit to stand: 18.06 sec  2 minute walk test: 354.6FT   (510ft Norm) using in boot Assistive device utilized: None Level of assistance: Complete Independence Comments: Pt unsteady due to pain in left foot in walking boot  [facility] Adult PT Treatment:  DATE: 02-22-24 Therapeutic Exercise: Gastroc stretch on L and R 30 sec x 2 each with VC and TC Soleus Stretch on L and R 30 sec x 2 each with VC and TC 5 x 45 sec stretch and isometric hold with 90 sec rest between to increase load Hip abduction on rightsidelying  10 x , then 8 x then 7 times to fatigue Chair squat  2 x 10 Manual Therapy: Trigger Point Dry Needling  Subsequent Treatment: Instructions provided previously at initial dry needling treatment.  Instructions reviewed, if requested by the patient, prior to subsequent dry needling treatment.   Patient Verbal Consent Given: Yes Education Handout Provided: Previously Provided Muscles Treated: Left gastroc med and lateral and Left quadratus plantae  Electrical Stimulation Performed: No Treatment Response/Outcome: minimal tissue release, and twitch response        [facility] Adult PT Treatment:                                                DATE: 02-08-24 Therapeutic Exercise: Seated heel raise and toe raise Toe Yoga Plantar fascial stretch Great toe extension stretch Heel raise with arms on wall 3 x 10 bil. Sit to stand with sink support  Gastroc stretch on L and R 30 sec x 2 each Supine Bridge x 10 Manual Therapy: Trigger Point Dry Needling STW of gastroc Left IASTYM of left plantar fascia Initial Treatment: Pt instructed on Dry Needling rational, procedures, and possible side effects. Pt instructed to expect mild to moderate muscle soreness later in the day and/or into the next day.  Pt instructed in methods to reduce muscle soreness. Pt instructed to continue prescribed HEP. Because Dry  Needling was performed over or adjacent to a lung field, pt was educated on S/S of pneumothorax and to seek immediate medical attention should they occur.  Patient was educated on signs and symptoms of infection and other risk factors and advised to seek medical attention should they occur.  Patient verbalized understanding of these instructions and education.   Patient Verbal Consent Given: Yes Education Handout Provided: Yes Muscles Treated: quadratus plantae Left foot. Lateral gastroc Electrical Stimulation Performed: No Treatment Response/Outcome: Twitch response and decreased muscle tension   Neuromuscular re-ed: SLS on R and L attempting to 30 sec x 3 on each side Bil stretch of gastroc off step  SLS on R and L on there ex 15 sec x 4  pt with throbbing in left plantar fascia                                                                                                                   TREATMENT DATE: 02-01-24  Eval and issue HEP    PATIENT EDUCATION:  Education details: POC Explanation of findings, issue HEP Person educated: Patient Education method: Explanation, Demonstration, Tactile cues, Verbal cues, and Handouts Education comprehension: verbalized understanding, returned  demonstration, verbal cues required, and needs further education  HOME EXERCISE PROGRAM: Access Code: [email or web address] URL: [email or web address] Date: 02/01/2024 Prepared by: Sharlet Dawson  Exercises - Heel raise with counter support and towel under toes  - 1 x daily - 7 x weekly - 3 sets - 10 reps - Single Leg Stance  - 1 x daily - 7 x weekly - 30 to 60 seconds hold - Sit to stand with sink support Movement snack  - 1 x daily - 7 x weekly - 3 sets - 10 reps - Seated Heel Raise  - 1 x daily - 7 x weekly - 1-2 sets - 10 reps - Seated Plantar Fascia Stretch  - 2 x daily - 7 x weekly - 1 sets - 3 reps - 30 second hold - Seated Self Great Toe Stretch  - 1 x daily - 7 x weekly - 1 sets - 10-15  reps - Gastroc Stretch on Wall  - 1 x daily - 7 x weekly - 3 sets - 10 reps - Supine Bridge  - 1 x daily - 7 x weekly - 3 sets - 10 reps Added 02-22-24 Program Notes 45 sec hold with left foot stretch on step, then 90 sec rest between.  Try to not go over 6/10 pain  - Standing Soleus Stretch  - 1 x daily - 7 x weekly - 1 sets - 3 reps - 30sec hold - Supine Bridge  - 1 x daily - 7 x weekly - 3 sets - 10 reps - Sidelying Hip Abduction  - 1 x daily - 7 x weekly - 3 sets - 10 reps ASSESSMENT:  CLINICAL IMPRESSION:  Pt returns to clinic with 3/10 pain consents to TPDN and is closely monitored throughout session.  Pt is 2/10 at end of session but was able to perform exercise with increased load in session and introducing 45 sec isometric at stretch on step on left.  Pt is continuing to wear walking boot until next MD visit and to continue exercises out of boot and to build resilience. HEP updated for hip strength and pain modulation with isometric. Pt left with all questions answered and not adverse effects.  EVAL- Patient is a 58 y.o. femsle who was seen today for physical therapy evaluation and treatment for Left plantar fasciitis.  Pt enters clinic with walking boot on left and unable to tolerate longer than 5 min standing without pain to 7/10  Pt reports 9/10 pain without walking boot.  Pt will benefit from skilled PT to address limitations in walking and daily activities to return to maximize functional mobility.   OBJECTIVE IMPAIRMENTS: decreased activity tolerance, decreased endurance, difficulty walking, decreased ROM, decreased strength, obesity, and pain.   ACTIVITY LIMITATIONS: standing, squatting, stairs, and locomotion level  PARTICIPATION LIMITATIONS: meal prep, cleaning, laundry, shopping, community activity, and substitute teaching  PERSONAL FACTORS: M72.2 (ICD-10-CM) - Plantar fasciitis of left foot  are also affecting patient's functional outcome.   REHAB POTENTIAL:  Good  CLINICAL DECISION MAKING: Evolving/moderate complexity  EVALUATION COMPLEXITY: Moderate   GOALS: Goals reviewed with patient? Yes  SHORT TERM GOALS: Target date: 02-22-24 Pt will be independent with initial HEP Baseline:no knowledge Goal status: INITIAL  2.  Pt with be educated on home walking program Baseline: no knowledge Goal status: INITIAL  3.  Pt pain level in boot will reduce to 5/10 Baseline: Pt in boot max 7/10 and out of boot 9/10 Goal status: INITIAL    LONG TERM GOALS:  Target date: 03-16-24  Pt will be independent with advanced HEP Baseline: no knowledge Goal status: INITIAL  2.  Pt will be able to perform household chores with minimal pain for 30 minutes Baseline: Pt unable to stand for 5 min without 7-9/10 pain Goal status: INITIAL  3.  Pt will be able to perform 6 MWT within normal score Baseline: 5 times sit to stand: 18.06 sec  2 minute walk test: 354.6FT   (57ft Norm) using in boot Goal status: INITIAL  4.  Pt will be consistent with home walking program at least 25 min a day with minimal pain  Baseline: unable to walk for exercise over 5 min Goal status: INITIAL  5.  Pt will be able to wean to a normal tennis show with minimal pain Baseline: unable to walk without walking boot Goal status: INITIAL  6.  Pt LEFS will improve to at least 51/80 to show improved functional change Baseline: eval LEFS 39/80   48.8% Goal status: INITIAL  7.  Pt will be able to perform standing SL heel raise on LT 15/25 x to show improved tolerance and strength  Baseline: L SL  0/25  Goal status: INITIAL   PLAN:  PT FREQUENCY: 1-2x/week  PT DURATION: 6 weeks  PLANNED INTERVENTIONS: 97164- PT Re-evaluation, 97110-Therapeutic exercises, 97530- Therapeutic activity, 97112- Neuromuscular re-education, 97535- Self Care, 40981- Manual therapy, [phone]- Gait training, [phone]- Electrical stimulation (unattended), [phone]- Electrical stimulation (manual), Patient/Family  education, Balance training, Stair training, Taping, Dry Needling, Joint mobilization, Cryotherapy, and Moist heat  PLAN FOR NEXT SESSION: TPDN, Manual , HEP   Sharlet Dawson, PT, [employer] Certified Exercise Expert for the Aging Adult  02/22/24 9:31 AM Phone: [phone] Fax: [phone]   For all possible CPT codes, reference the Planned Interventions line above.     Check all conditions that are expected to impact treatment: {Conditions expected to impact treatment:Musculoskeletal disorders   If treatment provided at initial evaluation, no treatment charged due to lack of authorization.      Sharlet Dawson, PT, ATRIC Certified Exercise Expert for the Aging Adult  05/09/24 2:39 PM Phone: [phone] Fax: [phone]

## 2024-02-18 ENCOUNTER — Ambulatory Visit
Admission: RE | Admit: 2024-02-18 | Discharge: 2024-02-18 | Disposition: A | Discharge status: Home / Self Care | Source: Ambulatory Visit | Attending: Neurology | Admitting: Neurology

## 2024-02-18 DIAGNOSIS — G4733 Obstructive sleep apnea (adult) (pediatric): Secondary | ICD-10-CM

## 2024-02-18 DIAGNOSIS — R29818 Other symptoms and signs involving the nervous system: Secondary | ICD-10-CM

## 2024-02-18 DIAGNOSIS — R030 Elevated blood-pressure reading, without diagnosis of hypertension: Secondary | ICD-10-CM

## 2024-02-18 DIAGNOSIS — R200 Anesthesia of skin: Secondary | ICD-10-CM

## 2024-02-18 MED ORDER — GADOPICLENOL 0.5 MMOL/ML IV SOLN
10.0000 mL | Freq: Once | INTRAVENOUS | Status: AC | PRN
  Administered 2024-02-18: 10 mL via INTRAVENOUS

## 2024-02-21 ENCOUNTER — Encounter: Payer: Self-pay | Admitting: Neurology

## 2024-02-22 ENCOUNTER — Ambulatory Visit: Admitting: Physical Therapy

## 2024-02-22 ENCOUNTER — Encounter: Payer: Self-pay | Admitting: Physical Therapy

## 2024-02-22 DIAGNOSIS — M6281 Muscle weakness (generalized): Secondary | ICD-10-CM

## 2024-02-22 DIAGNOSIS — M25511 Pain in right shoulder: Secondary | ICD-10-CM

## 2024-02-22 DIAGNOSIS — M79672 Pain in left foot: Secondary | ICD-10-CM | POA: Diagnosis not present

## 2024-02-22 DIAGNOSIS — R262 Difficulty in walking, not elsewhere classified: Secondary | ICD-10-CM

## 2024-02-23 NOTE — Therapy (Incomplete)
 OUTPATIENT PHYSICAL THERAPY LOWER EXTREMITY TREATMENT   Patient Name: Britlyn Martine MRN: [id number] DOB:08-Dec-1965, 58 y.o., female Today's Date: 02/23/2024  END OF SESSION:      Past Medical History:  Diagnosis Date   Allergy    Asthma    no current problems, does not use inhaler   Diabetes mellitus without complication (HCC)    type 2   Essential hypertension    Hyperlipidemia    Stroke [facility])    Past Surgical History:  Procedure Laterality Date   APPENDECTOMY  1985   exploratory laparoscopy     WISDOM TOOTH EXTRACTION     Patient Active Problem List   Diagnosis Date Noted   Anxiety 08/26/2023   Family history of malignant melanoma of skin 08/01/2023   Complicated migraine 03/21/2023   Acute ischemic stroke (HCC) 05/27/2022   Type 2 diabetes mellitus with hyperglycemia (HCC) 10/07/2021   SVT (supraventricular tachycardia) (HCC) 10/07/2021   Family history of thyroid disorder 07/15/2020   Mixed hyperlipidemia 04/23/2020   Family history of breast cancer 03/11/2020   Type 2 diabetes mellitus (HCC) 03/11/2020   Hypertension, essential 07/31/2016   Family history of Alzheimer's disease 08/04/2009    PCP: Georganna Skeans, MD   REFERRING PROVIDER: Edwin Cap, DPM   REFERRING DIAG: M72.2 (ICD-10-CM) - Plantar fasciitis of left foot   THERAPY DIAG:  No diagnosis found.  Rationale for Evaluation and Treatment: Rehabilitation  ONSET DATE: Approximately 6 months and worsening  SUBJECTIVE:   SUBJECTIVE STATEMENT:   02-22-24 I had a sore throat last week so I was sorry to miss. My pain is a 3/10 today. I am still having knee pain with the boot.    If I wear the boot all day and I have pain and then I have pain in my knee as well. When I dont wear the boot I have no pain in my knee.  When I walk a lot it is starting to throb .  My pain now is a 1/10.  But I would like to try dry needling    EVAL-   I have had this pain for about 6months and it was getting  unbearable.  I just was put into a walking boot.  Dr Lilian Kapur wants me to do PT for about 6 weeks and then re evaluate.  I  have trouble walking, not really had stairs to walk on.  Without the boot I am so painful.  With the boot I feel awkward and it bothers my knees. My pain is unbearable toward end of day and I can only tolerate about 5 min of standing or walking  PERTINENT HISTORY: DM , HTN Asthma, Hyperlipidemia, appt 1985, Hs of frozen shld R, obesity PAIN:  Are you having pain? Yes: NPRS scale: at rest 1/10 but when I stand or walk 7/10 at the end of the day with the boot and without the boot 9/10 Pain location: Left heel Pain description: left heel plantar medial band of plantar fascia Aggravating factors: moving my foot. I can stand for about 5 min before I start feeling pain. Can't shop and household chores Relieving factors: walking boot  PRECAUTIONS: Other: Valsarten , mold dust, pollen, codeine, metformin Atorvastatin, ozempic  RED FLAGS: None   WEIGHT BEARING RESTRICTIONS: No  FALLS:  Has patient fallen in last 6 months? 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  DIAGNOSTIC FINDINGS: Foot X-ray: Pes cavus, no calcaneal spur, no fracture or stress fracture (01/25/2024)  per Dr Katina Dung  PATIENT SURVEYS:  LEFS 39/80   48.8%  COGNITION: Overall cognitive status: Within functional limits for tasks assessed     SENSATION: WFL  EDEMA:  NT  MUSCLE  LENGTH: Hamstrings:Bil tightness   POSTURE: rounded shoulders, forward head, anterior pelvic tilt, and obesity and pes cavus bil  PALPATION: TTP left heel plantar medial band of plantar fascia   LOWER EXTREMITY ROM:  Active ROM Right eval Left eval  Hip flexion    Hip extension    Hip abduction    Hip adduction    Hip internal rotation    Hip external rotation    Knee flexion 125 130  Knee extension 0 0  Ankle dorsiflexion 10 5  Ankle plantarflexion 51 50  Ankle inversion 42 40  Ankle eversion 30 29   (Blank rows = not tested)  LOWER EXTREMITY MMT:  MMT Right eval Left eval  Hip flexion 4+ 4+  Hip extension 4 4  Hip abduction 4 4-  Hip adduction    Hip internal rotation    Hip external rotation    Knee flexion 5 5  Knee extension 5 4+  Ankle dorsiflexion    Ankle plantarflexion    Ankle inversion    Ankle eversion 15/25 0/25   (Blank rows = not tested)  LOWER EXTREMITY SPECIAL TESTS:  NT  FUNCTIONAL TESTS:  5 times sit to stand: 18.06 sec  2 minute walk test: 354.6FT   (561ft Norm) using in boot   4/10 at end of 2 min   SL balance on R is 23 sec L in boot3 sec GAIT: Distance walked: 5 times sit to stand: 18.06 sec  2 minute walk test: 354.6FT   (559ft Norm) using in boot Assistive device utilized: None Level of assistance: Complete Independence Comments: Pt unsteady due to pain in left foot in walking boot   [facility] Adult PT Treatment:                                                DATE: 02-25-24 Therapeutic Exercise: *** Manual Therapy: *** Neuromuscular re-ed: *** Therapeutic Activity: *** Modalities: *** Self Care: ***   [facility] Adult PT Treatment:                                                DATE: 02-22-24 Therapeutic Exercise: Gastroc stretch on L and R 30 sec x 2 each with VC and TC Soleus Stretch on L and R 30 sec x 2 each with VC and TC 5 x 45 sec stretch and isometric hold with 90 sec rest between to increase load Hip abduction on  rightsidelying  10 x , then 8 x then 7 times to fatigue Chair  squat  2 x 10 Manual Therapy: Trigger Point Dry Needling  Subsequent Treatment: Instructions provided previously at initial dry needling treatment.  Instructions reviewed, if requested by the patient, prior to subsequent dry needling treatment.   Patient Verbal Consent Given: Yes Education Handout Provided: Previously Provided Muscles Treated: Left gastroc med and lateral and Left quadratus plantae  Electrical Stimulation Performed: No Treatment Response/Outcome: minimal tissue release, and twitch response        [facility] Adult PT Treatment:                                                DATE: 02-08-24 Therapeutic Exercise: Seated heel raise and toe raise Toe Yoga Plantar fascial stretch Great toe extension stretch Heel raise with arms on wall 3 x 10 bil. Sit to stand with sink support  Gastroc stretch on L and R 30 sec x 2 each Supine Bridge x 10 Manual Therapy: Trigger Point Dry Needling STW of gastroc Left IASTYM of left plantar fascia Initial Treatment: Pt instructed on Dry Needling rational, procedures, and possible side effects. Pt instructed to expect mild to moderate muscle soreness later in the day and/or into the next day.  Pt instructed in methods to reduce muscle soreness. Pt instructed to continue prescribed HEP. Because Dry Needling was performed over or adjacent to a lung field, pt was educated on S/S of pneumothorax and to seek immediate medical attention should they occur.  Patient was educated on signs and symptoms of infection and other risk factors and advised to seek medical attention should they occur.  Patient verbalized understanding of these instructions and education.   Patient Verbal Consent Given: Yes Education Handout Provided: Yes Muscles Treated: quadratus plantae Left foot. Lateral gastroc Electrical Stimulation Performed: No Treatment Response/Outcome: Twitch response and decreased  muscle tension   Neuromuscular re-ed: SLS on R and L attempting to 30 sec x 3 on each side Bil stretch of gastroc off step  SLS on R and L on there ex 15 sec x 4  pt with throbbing in left plantar fascia                                                                                                                   TREATMENT DATE: 02-01-24  Eval and issue HEP    PATIENT EDUCATION:  Education details: POC Explanation of findings, issue HEP Person educated: Patient Education method: Explanation, Demonstration, Tactile cues, Verbal cues, and Handouts Education comprehension: verbalized understanding, returned demonstration, verbal cues required, and needs further education  HOME EXERCISE PROGRAM: Access Code: [email or web address] URL: [email or web address] Date: 02/01/2024 Prepared by: Garen Lah  Exercises - Heel raise with counter support and towel under toes  - 1 x daily - 7 x weekly - 3 sets - 10 reps - Single Leg Stance  - 1 x daily - 7 x weekly - 30 to 60  seconds hold - Sit to stand with sink support Movement snack  - 1 x daily - 7 x weekly - 3 sets - 10 reps - Seated Heel Raise  - 1 x daily - 7 x weekly - 1-2 sets - 10 reps - Seated Plantar Fascia Stretch  - 2 x daily - 7 x weekly - 1 sets - 3 reps - 30 second hold - Seated Self Great Toe Stretch  - 1 x daily - 7 x weekly - 1 sets - 10-15 reps - Gastroc Stretch on Wall  - 1 x daily - 7 x weekly - 3 sets - 10 reps - Supine Bridge  - 1 x daily - 7 x weekly - 3 sets - 10 reps Added 02-22-24 Program Notes 45 sec hold with left foot stretch on step, then 90 sec rest between.  Try to not go over 6/10 pain  - Standing Soleus Stretch  - 1 x daily - 7 x weekly - 1 sets - 3 reps - 30sec hold - Supine Bridge  - 1 x daily - 7 x weekly - 3 sets - 10 reps - Sidelying Hip Abduction  - 1 x daily - 7 x weekly - 3 sets - 10 reps ASSESSMENT:  CLINICAL IMPRESSION:  Pt returns to clinic with 3/10 pain consents to TPDN and is closely  monitored throughout session.  Pt is 2/10 at end of session but was able to perform exercise with increased load in session and introducing 45 sec isometric at stretch on step on left.  Pt is continuing to wear walking boot until next MD visit and to continue exercises out of boot and to build resilience. HEP updated for hip strength and pain modulation with isometric. Pt left with all questions answered and not adverse effects.  EVAL- Patient is a 58 y.o. femsle who was seen today for physical therapy evaluation and treatment for Left plantar fasciitis.  Pt enters clinic with walking boot on left and unable to tolerate longer than 5 min standing without pain to 7/10  Pt reports 9/10 pain without walking boot.  Pt will benefit from skilled PT to address limitations in walking and daily activities to return to maximize functional mobility.   OBJECTIVE IMPAIRMENTS: decreased activity tolerance, decreased endurance, difficulty walking, decreased ROM, decreased strength, obesity, and pain.   ACTIVITY LIMITATIONS: standing, squatting, stairs, and locomotion level  PARTICIPATION LIMITATIONS: meal prep, cleaning, laundry, shopping, community activity, and substitute teaching  PERSONAL FACTORS: M72.2 (ICD-10-CM) - Plantar fasciitis of left foot  are also affecting patient's functional outcome.   REHAB POTENTIAL: Good  CLINICAL DECISION MAKING: Evolving/moderate complexity  EVALUATION COMPLEXITY: Moderate   GOALS: Goals reviewed with patient? Yes  SHORT TERM GOALS: Target date: 02-22-24 Pt will be independent with initial HEP Baseline:no knowledge Goal status: INITIAL  2.  Pt with be educated on home walking program Baseline: no knowledge Goal status: INITIAL  3.  Pt pain level in boot will reduce to 5/10 Baseline: Pt in boot max 7/10 and out of boot 9/10 Goal status: INITIAL    LONG TERM GOALS: Target date: 03-16-24  Pt will be independent with advanced HEP Baseline: no knowledge Goal  status: INITIAL  2.  Pt will be able to perform household chores with minimal pain for 30 minutes Baseline: Pt unable to stand for 5 min without 7-9/10 pain Goal status: INITIAL  3.  Pt will be able to perform 6 MWT within normal score Baseline: 5 times sit to  stand: 18.06 sec  2 minute walk test: 354.6FT   (557ft Norm) using in boot Goal status: INITIAL  4.  Pt will be consistent with home walking program at least 25 min a day with minimal pain  Baseline: unable to walk for exercise over 5 min Goal status: INITIAL  5.  Pt will be able to wean to a normal tennis show with minimal pain Baseline: unable to walk without walking boot Goal status: INITIAL  6.  Pt LEFS will improve to at least 51/80 to show improved functional change Baseline: eval LEFS 39/80   48.8% Goal status: INITIAL  7.  Pt will be able to perform standing SL heel raise on LT 15/25 x to show improved tolerance and strength  Baseline: L SL  0/25  Goal status: INITIAL   PLAN:  PT FREQUENCY: 1-2x/week  PT DURATION: 6 weeks  PLANNED INTERVENTIONS: 97164- PT Re-evaluation, 97110-Therapeutic exercises, 97530- Therapeutic activity, 97112- Neuromuscular re-education, 97535- Self Care, 16109- Manual therapy, [id number]- Gait training, [phone]- Electrical stimulation (unattended), [phone]- Electrical stimulation (manual), Patient/Family education, Balance training, Stair training, Taping, Dry Needling, Joint mobilization, Cryotherapy, and Moist heat  PLAN FOR NEXT SESSION: TPDN, Manual , HEP   ***  For all possible CPT codes, reference the Planned Interventions line above.     Check all conditions that are expected to impact treatment: {Conditions expected to impact treatment:Musculoskeletal disorders   If treatment provided at initial evaluation, no treatment charged due to lack of authorization.

## 2024-02-24 ENCOUNTER — Ambulatory Visit: Admitting: Physical Therapy

## 2024-02-29 ENCOUNTER — Ambulatory Visit: Admitting: Physical Therapy

## 2024-03-02 ENCOUNTER — Ambulatory Visit: Admitting: Physical Therapy

## 2024-03-06 ENCOUNTER — Other Ambulatory Visit: Payer: Medicaid Other

## 2024-03-07 ENCOUNTER — Ambulatory Visit: Payer: Medicaid Other | Admitting: Podiatry

## 2024-03-09 ENCOUNTER — Ambulatory Visit: Payer: Medicaid Other | Admitting: Endocrinology

## 2024-03-21 ENCOUNTER — Ambulatory Visit: Admitting: Podiatry

## 2024-03-22 ENCOUNTER — Ambulatory Visit: Admitting: Endocrinology

## 2024-04-04 ENCOUNTER — Encounter: Payer: Self-pay | Admitting: Podiatry

## 2024-04-04 ENCOUNTER — Ambulatory Visit: Admitting: Podiatry

## 2024-04-04 VITALS — Ht 65.0 in | Wt 235.0 lb

## 2024-04-04 DIAGNOSIS — M722 Plantar fascial fibromatosis: Secondary | ICD-10-CM

## 2024-04-04 NOTE — Progress Notes (Signed)
  Subjective:  Patient ID: Jessica Ponce, female    DOB: 13-May-1966,  MRN: [id number]  Chief Complaint  Patient presents with   Plantar Fasciitis    Patient is here for plantar fasciitis F/U, states foot is doing better     History of Present Illness   Today doing much better the boot has been very helpful she was not able to do more than a few sessions of physical therapy due to illness of her mother that she had to take care of but overall is improving      Objective:    Physical Exam   EXTREMITIES: Palpable pulse, good capillary refill time, foot warm and well perfused. MUSCULOSKELETAL: Today very little pain on palpation to the plantar medial band today   No images are attached to the encounter.    Results   LABS A1c: 10%  RADIOLOGY Foot X-ray: Pes cavus, no calcaneal spur, no fracture or stress fracture (01/25/2024)      Assessment:   Encounter Diagnosis  Name Primary?   Plantar fasciitis of left foot Yes       Plan:  Patient was evaluated and treated and all questions answered.  Assessment and Plan    Plantar Fasciitis Doing much better today less painful she may transition out of the boot as tolerated expect should resolve the next 1 to 2 months if not return for injection as needed she will continue her home therapy does not need to restart formal physical therapy unless she is not improving and she will let me know if she needs to do this

## 2024-04-06 DIAGNOSIS — K121 Other forms of stomatitis: Secondary | ICD-10-CM | POA: Insufficient documentation

## 2024-04-06 DIAGNOSIS — L98493 Non-pressure chronic ulcer of skin of other sites with necrosis of muscle: Secondary | ICD-10-CM | POA: Insufficient documentation

## 2024-04-08 ENCOUNTER — Other Ambulatory Visit: Payer: Self-pay | Admitting: Dermatology

## 2024-04-08 DIAGNOSIS — L719 Rosacea, unspecified: Secondary | ICD-10-CM

## 2024-04-10 ENCOUNTER — Encounter (HOSPITAL_COMMUNITY): Payer: Self-pay

## 2024-04-10 ENCOUNTER — Ambulatory Visit: Payer: Medicaid Other | Admitting: Family Medicine

## 2024-04-17 ENCOUNTER — Ambulatory Visit: Admitting: Family Medicine

## 2024-04-17 ENCOUNTER — Encounter: Payer: Self-pay | Admitting: Family Medicine

## 2024-04-17 ENCOUNTER — Ambulatory Visit: Payer: Medicaid Other | Admitting: Neurology

## 2024-04-17 ENCOUNTER — Other Ambulatory Visit: Payer: Self-pay | Admitting: Family Medicine

## 2024-04-17 VITALS — BP 120/80 | HR 75 | Resp 14 | Ht 65.0 in | Wt 234.6 lb

## 2024-04-17 DIAGNOSIS — Z794 Long term (current) use of insulin: Secondary | ICD-10-CM | POA: Diagnosis not present

## 2024-04-17 DIAGNOSIS — G8929 Other chronic pain: Secondary | ICD-10-CM

## 2024-04-17 DIAGNOSIS — E66812 Obesity, class 2: Secondary | ICD-10-CM

## 2024-04-17 DIAGNOSIS — E119 Type 2 diabetes mellitus without complications: Secondary | ICD-10-CM

## 2024-04-17 DIAGNOSIS — E782 Mixed hyperlipidemia: Secondary | ICD-10-CM

## 2024-04-17 DIAGNOSIS — K219 Gastro-esophageal reflux disease without esophagitis: Secondary | ICD-10-CM

## 2024-04-17 DIAGNOSIS — F32A Depression, unspecified: Secondary | ICD-10-CM

## 2024-04-17 DIAGNOSIS — E1165 Type 2 diabetes mellitus with hyperglycemia: Secondary | ICD-10-CM | POA: Diagnosis not present

## 2024-04-17 DIAGNOSIS — Z6837 Body mass index (BMI) 37.0-37.9, adult: Secondary | ICD-10-CM

## 2024-04-17 DIAGNOSIS — F419 Anxiety disorder, unspecified: Secondary | ICD-10-CM

## 2024-04-17 DIAGNOSIS — U099 Post covid-19 condition, unspecified: Secondary | ICD-10-CM

## 2024-04-17 DIAGNOSIS — M2550 Pain in unspecified joint: Secondary | ICD-10-CM

## 2024-04-17 LAB — POCT GLYCOSYLATED HEMOGLOBIN (HGB A1C): HbA1c, POC (controlled diabetic range): 9 % — AB (ref 0.0–7.0)

## 2024-04-17 NOTE — Progress Notes (Signed)
 Pt is here for 3 month f/u   Requesting referral to GI for possible ulcer

## 2024-04-17 NOTE — Progress Notes (Signed)
 Established Patient Office Visit  Subjective    Patient ID: Jessica Ponce, female    DOB: 1966-01-20  Age: 58 y.o. MRN: [id number]  CC:  Chief Complaint  Patient presents with   Follow-up    HPI Jessica Ponce presents for routine follow up of chronic med issues including diabetes and anxiety/depression. Patient was also seen in UC and dx with an ulcer.   Outpatient Encounter Medications as of 04/17/2024  Medication Sig   albuterol  (PROVENTIL ) (2.5 MG/3ML) 0.083% nebulizer solution Take 2.5 mg by nebulization every 4 (four) hours as needed for shortness of breath or wheezing.   amLODipine  (NORVASC ) 10 MG tablet TAKE 1 TABLET BY MOUTH EVERY DAY   aspirin  EC 81 MG tablet Take 1 tablet (81 mg total) by mouth daily. Swallow whole.   budesonide-formoterol (SYMBICORT ) 160-4.5 MCG/ACT inhaler Inhale 2 puffs into the lungs in the morning and at bedtime.   buPROPion  (WELLBUTRIN  XL) 150 MG 24 hr tablet TAKE 1 TABLET BY MOUTH EVERY DAY   Continuous Glucose Sensor (DEXCOM G7 SENSOR) MISC Use to check blood glucose throughout the day. Changes sensors once every 10 days.   doxycycline  (VIBRAMYCIN ) 50 MG capsule TAKE 1 CAPSULE BY MOUTH EVERY DAY   empagliflozin  (JARDIANCE ) 25 MG TABS tablet Take 1 tablet (25 mg total) by mouth daily before breakfast.   ezetimibe  (ZETIA ) 10 MG tablet TAKE 1 TABLET BY MOUTH EVERY DAY   gabapentin  (NEURONTIN ) 300 MG capsule TAKE 1 CAPSULE BY MOUTH THREE TIMES A DAY   insulin  aspart (NOVOLOG  FLEXPEN) 100 UNIT/ML FlexPen Inject 20 Units into the skin 3 (three) times daily with meals. Plus sliding scale as instructed.   insulin  glargine-yfgn (SEMGLEE ) 100 UNIT/ML Pen Inject 64 Units into the skin daily.   Insulin  Pen Needle (BD PEN NEEDLE NANO 2ND GEN) 32G X 4 MM MISC Use to inject insulin  a total of 4 times daily.   levothyroxine  (SYNTHROID ) 25 MCG tablet Take 1 tablet (25 mcg total) by mouth daily.   losartan  (COZAAR ) 100 MG tablet TAKE 1 TABLET BY MOUTH EVERY DAY    metroNIDAZOLE  (METROCREAM ) 0.75 % cream Apply topically 2 (two) times daily.   omeprazole  (PRILOSEC) 20 MG capsule TAKE 1 CAPSULE BY MOUTH EVERY DAY   sertraline  (ZOLOFT ) 100 MG tablet TAKE 1 TABLET BY MOUTH EVERY DAY   Tiotropium Bromide Monohydrate  (SPIRIVA  RESPIMAT) 1.25 MCG/ACT AERS Inhale 2 puffs into the lungs daily.   VENTOLIN  HFA 108 (90 Base) MCG/ACT inhaler Inhale 2 puffs into the lungs every 4 (four) hours as needed for wheezing or shortness of breath.   No facility-administered encounter medications on file as of 04/17/2024.    Past Medical History:  Diagnosis Date   Allergy    Asthma    no current problems, does not use inhaler   Diabetes mellitus without complication (HCC)    type 2   Essential hypertension    Hyperlipidemia    Stroke [facility])     Past Surgical History:  Procedure Laterality Date   APPENDECTOMY  1985   exploratory laparoscopy     WISDOM TOOTH EXTRACTION      Family History  Problem Relation Age of Onset   Memory loss Mother    Breast cancer Mother 68   Stroke Father    Breast cancer Maternal Aunt 26   Colon cancer Neg Hx    Rectal cancer Neg Hx    Stomach cancer Neg Hx    Esophageal cancer Neg Hx     Social  History   Socioeconomic History   Marital status: Married    Spouse name: Bambi Lever   Number of children: 2   Years of education: 16   Highest education level: Bachelor's degree (e.g., BA, AB, BS)  Occupational History   Occupation: Self Employed  Tobacco Use   Smoking status: Never    Passive exposure: Never   Smokeless tobacco: Never  Vaping Use   Vaping status: Never Used  Substance and Sexual Activity   Alcohol use: Yes    Alcohol/week: 0.0 standard drinks of alcohol    Comment: occasional wine   Drug use: No   Sexual activity: Yes    Birth control/protection: Post-menopausal  Other Topics Concern   Not on file  Social History Narrative   Lives with husband and 2 kids   Caffeine use: Drinks 2-3 cups coffee per day    Social Drivers of Health   Financial Resource Strain: Medium Risk (12/09/2023)   Overall Financial Resource Strain (CARDIA)    Difficulty of Paying Living Expenses: Somewhat hard  Food Insecurity: Food Insecurity Present (12/09/2023)   Hunger Vital Sign    Worried About Running Out of Food in the Last Year: Sometimes true    Ran Out of Food in the Last Year: Sometimes true  Transportation Needs: No Transportation Needs (12/09/2023)   PRAPARE - Administrator, Civil Service (Medical): No    Lack of Transportation (Non-Medical): No  Physical Activity: Insufficiently Active (12/09/2023)   Exercise Vital Sign    Days of Exercise per Week: 2 days    Minutes of Exercise per Session: 20 min  Stress: Stress Concern Present (12/09/2023)   [employer] of Occupational Health - Occupational Stress Questionnaire    Feeling of Stress : Rather much  Social Connections: Socially Integrated (12/09/2023)   Social Connection and Isolation Panel [NHANES]    Frequency of Communication with Friends and Family: Twice a week    Frequency of Social Gatherings with Friends and Family: Once a week    Attends Religious Services: 1 to 4 times per year    Active Member of [employer] or Organizations: No    Attends Engineer, structural: More than 4 times per year    Marital Status: Married  Catering manager Violence: Not on file    Review of Systems  Gastrointestinal:  Positive for abdominal pain and heartburn.        Objective    BP 120/80 (BP Location: Right Arm, Patient Position: Sitting)   Pulse 75   Resp 14   Ht 5\' 5"  (1.651 m)   Wt 234 lb 9.6 oz (106.4 kg)   LMP 12/10/2015 (LMP Unknown)   SpO2 92%   BMI 39.04 kg/m   Physical Exam Vitals and nursing note reviewed.  Constitutional:      General: She is not in acute distress.    Appearance: She is obese.  Cardiovascular:     Rate and Rhythm: Normal rate and regular rhythm.  Pulmonary:     Effort: Pulmonary effort is normal.      Breath sounds: Normal breath sounds.  Abdominal:     Palpations: Abdomen is soft.     Tenderness: There is no abdominal tenderness.  Neurological:     General: No focal deficit present.     Mental Status: She is alert and oriented to person, place, and time.         Assessment & Plan:  1. Type 2 diabetes mellitus with hyperglycemia, with  long-term current use of insulin  (HCC) (Primary) Improved A1c but not at goal. Continue  - POCT glycosylated hemoglobin (Hb A1C)  2. Encounter for long-term (current) use of insulin  (HCC)   3. Mixed hyperlipidemia Continue   4. Gastroesophageal reflux disease without esophagitis Keep scheduled referral with GI  5. Anxiety and depression   6. Class 2 severe obesity due to excess calories with serious comorbidity and body mass index (BMI) of 37.0 to 37.9 in adult [facility])      No follow-ups on file.   Arlo Lama, MD

## 2024-04-22 ENCOUNTER — Other Ambulatory Visit: Payer: Self-pay | Admitting: Family Medicine

## 2024-04-22 DIAGNOSIS — E119 Type 2 diabetes mellitus without complications: Secondary | ICD-10-CM

## 2024-05-01 ENCOUNTER — Other Ambulatory Visit: Payer: Self-pay | Admitting: Family Medicine

## 2024-05-01 ENCOUNTER — Other Ambulatory Visit: Payer: Self-pay | Admitting: Endocrinology

## 2024-05-01 DIAGNOSIS — E119 Type 2 diabetes mellitus without complications: Secondary | ICD-10-CM

## 2024-05-01 DIAGNOSIS — E1169 Type 2 diabetes mellitus with other specified complication: Secondary | ICD-10-CM

## 2024-05-01 NOTE — Telephone Encounter (Unsigned)
 Copied from CRM [id number]. Topic: Clinical - Medication Refill >> May 01, 2024 10:13 AM Alysia Jumbo S wrote: Medication: Continuous Glucose Sensor (DEXCOM G7 SENSOR) MISC  Has the patient contacted their pharmacy? Yes (Agent: If no, request that the patient contact the pharmacy for the refill. If patient does not wish to contact the pharmacy document the reason why and proceed with request.) (Agent: If yes, when and what did the pharmacy advise?)  This is the patient's preferred pharmacy:  [facility] [phone] Jonette Nestle, [place] - [address] [place] [address] Phone: [phone] Fax: [phone]    Is this the correct pharmacy for this prescription? Yes If no, delete pharmacy and type the correct one.   Has the prescription been filled recently? No  Is the patient out of the medication? Yes  Has the patient been seen for an appointment in the last year OR does the patient have an upcoming appointment? Yes  Can we respond through MyChart? Yes  Agent: Please be advised that Rx refills may take up to 3 business days. We ask that you follow-up with your pharmacy.

## 2024-05-02 NOTE — Telephone Encounter (Signed)
 Refilled 04/18/24 # 3 with 3 refills. Requested Prescriptions  Refused Prescriptions Disp Refills   Continuous Glucose Sensor (DEXCOM G7 SENSOR) MISC 3 each 3    Sig: Use to check blood glucose throughout the day. Changes sensors once every 10 days.     Endocrinology: Diabetes - Testing Supplies Passed - 05/02/2024 12:23 PM      Passed - Valid encounter within last 12 months    Recent Outpatient Visits           2 weeks ago Type 2 diabetes mellitus with hyperglycemia, with long-term current use of insulin  [facility])   [facility], Ray Caffey, MD   3 months ago Type 2 diabetes mellitus without complication, without long-term current use of insulin  [facility])   [facility]out - [facility] Of [place]. [facility] Verdel Gitelman L, RPH-CPP   3 months ago Type 2 diabetes mellitus with hyperglycemia, with long-term current use of insulin  [facility])   [facility], MD   4 months ago Mixed hyperlipidemia   [facility] [place]. [facility] Freada Jacobs, [place] L, RPH-CPP   5 months ago Type 2 diabetes mellitus with hyperglycemia, with long-term current use of insulin  [facility])   [facility] - A Dept Of . [facility] Valente Gaskin, RPH-CPP

## 2024-05-04 ENCOUNTER — Encounter: Payer: Self-pay | Admitting: Endocrinology

## 2024-05-04 ENCOUNTER — Other Ambulatory Visit: Payer: Self-pay | Admitting: Family Medicine

## 2024-05-04 ENCOUNTER — Other Ambulatory Visit: Payer: Self-pay

## 2024-05-04 ENCOUNTER — Ambulatory Visit: Admitting: Endocrinology

## 2024-05-04 VITALS — BP 130/78 | HR 78 | Resp 20 | Ht 65.0 in | Wt 232.8 lb

## 2024-05-04 DIAGNOSIS — Z7984 Long term (current) use of oral hypoglycemic drugs: Secondary | ICD-10-CM

## 2024-05-04 DIAGNOSIS — E038 Other specified hypothyroidism: Secondary | ICD-10-CM | POA: Diagnosis not present

## 2024-05-04 DIAGNOSIS — E1169 Type 2 diabetes mellitus with other specified complication: Secondary | ICD-10-CM | POA: Diagnosis not present

## 2024-05-04 DIAGNOSIS — E1165 Type 2 diabetes mellitus with hyperglycemia: Secondary | ICD-10-CM | POA: Diagnosis not present

## 2024-05-04 DIAGNOSIS — E118 Type 2 diabetes mellitus with unspecified complications: Secondary | ICD-10-CM

## 2024-05-04 DIAGNOSIS — E119 Type 2 diabetes mellitus without complications: Secondary | ICD-10-CM

## 2024-05-04 DIAGNOSIS — Z794 Long term (current) use of insulin: Secondary | ICD-10-CM | POA: Diagnosis not present

## 2024-05-04 DIAGNOSIS — Z1231 Encounter for screening mammogram for malignant neoplasm of breast: Secondary | ICD-10-CM

## 2024-05-04 MED ORDER — NOVOLOG FLEXPEN 100 UNIT/ML ~~LOC~~ SOPN: 5.0000 [IU] | PEN_INJECTOR | Freq: Three times a day (TID) | SUBCUTANEOUS | 3 refills | Status: DC

## 2024-05-04 MED ORDER — DEXCOM G7 SENSOR MISC: Status: DC

## 2024-05-04 MED ORDER — EMPAGLIFLOZIN 25 MG PO TABS: 25.0000 mg | ORAL_TABLET | Freq: Every day | ORAL | 3 refills | Status: AC

## 2024-05-04 MED ORDER — DEXCOM G7 SENSOR MISC: 3 refills | Status: DC

## 2024-05-04 MED ORDER — DEXCOM G7 SENSOR MISC: 3 refills | Status: AC

## 2024-05-04 NOTE — Progress Notes (Signed)
 Outpatient Endocrinology Note Jessica Khaleef Ruby, MD   Patient's Name: Jessica Ponce    DOB: January 06, 1966    MRN: [id number]                                                    REASON OF VISIT: New consult for type 2 diabetes mellitus  REFERRING PROVIDER: Abraham Abo, MD  PCP: Abraham Abo, MD  HISTORY OF PRESENT ILLNESS:   Jessica Ponce is a 58 y.o. old female with past medical history listed below, is here for new consult for type 2 diabetes mellitus.   Pertinent Diabetes History: Patient is referred to endocrinology for further evaluation and management of uncontrolled type 2 diabetes mellitus.  Patient was diagnosed with type 2 diabetes mellitus several years ago, 15+ years ago, has been on insulin  therapy for 5-7+ years at the time of initial consult.  She was diagnosed with gestational diabetes in 1999, then had prediabetes and ultimately developed type 2 diabetes mellitus over time.  She has uncontrolled type 2 diabetes mellitus with hemoglobin A1c in the range of 7.9-11.9 range in last few years.  Patient reports intolerance with metformin and Ozempic  in the past.  She denies taking any other antidiabetic medication other than multidose insulin  therapy in the past.  History of DKA or diabetes related hospitalizations: ? None, may be for hyperglycemia.  No history of diabetes ketoacidosis.  Previous diabetes education: Yes , long time ago.   Family h/o diabetes mellitus: none   Chronic Diabetes Complications : Retinopathy: no. Last ophthalmology exam was done on annually, 05/2022, following with ophthalmology regularly.  Nephropathy: no, on ACE/ARB / losartan  Peripheral neuropathy: numbness / tingling, on gabapentin  from neurology for facial numbness.  Coronary artery disease: no Stroke: TIA / ? stroke  Relevant comorbidities and cardiovascular risk factors: Obesity: yes Body mass index is 38.74 kg/m.  Hypertension: Yes  Hyperlipidemia : Yes, not on statin. Statin, increase sugars  and not feel good /statin intolerance, managed by cardiology/lipid clinic, planning for Repatha.  Current / Home Diabetic regimen includes:  Semglee  / glargine 64 units daily in the morning.  Novolog  5-10 units with meals 3 times a day, before meals.  Taking 1-2 times a day.   Prior diabetic medications: Metformin XR / regular GI intolerance. Ozempic  caused severe sickness / N/V.   Glycemic data:    CONTINUOUS GLUCOSE MONITORING SYSTEM (CGMS) INTERPRETATION: At today's visit, we reviewed CGM downloads. The full report is scanned in the media. Reviewing the CGM trends, blood glucose are as follows:  Dexcom G7 CGM-  Sensor Download (Sensor download was reviewed and summarized below.) Dates: May 22 to June 4 , 2025, 14 days  Glucose Management Indicator: 7.2% Sensor Average: 164 SD 30 Sensor usage : 92%     Previous:    Interpretation: Significant improvement of blood sugar with mostly acceptable blood sugar, mild occasional postprandial hyperglycemia with blood sugar dropped to low 200s range.  Blood sugar overnight, in between the meals are mostly acceptable.  No hypoglycemia.  Hypoglycemia: Patient has no hypoglycemic episodes. Patient has hypoglycemia awareness.  Factors modifying glucose control: 1.  Diabetic diet assessment: 3 meals a day.   2.  Staying active or exercising: Has planter fascitis on orthopedic boot.  Not able to exercise and walk at this time.  3.  Medication compliance: compliant most  of the time.  # Subclinical hypothyroidism : - Patient had mildly elevated TSH of 4.74 in February 07, 2024 with symptoms suggestive of hypothyroidism with fatigue and gradual weight gain, was started on levothyroxine  25 mcg daily.  Patient had negative thyroid  peroxidase antibody and thyroglobulin antibody.  Interval history  Dexcom G7 CGM data as reviewed above, significant improvement in blood sugar as compared to above, congratulated her.  GMI on CGM 7.2%.  She had  hemoglobin A1c 9.1% last month.  She has been eating better, improving on diet.  She reports compliant with basal insulin  however she takes NovoLog  as needed 1-2 times a day with meals about 5 to 10 units at a time.  She did not get Jardiance  as planned in the last visit, see thinks is still not approved by her medical insurance.  She has been taking levothyroxine  25 Mg daily, taking in the morning and reports compliance.  She has not felt much difference after being on levothyroxine .  No other complaints today.  REVIEW OF SYSTEMS As per history of present illness.   PAST MEDICAL HISTORY: Past Medical History:  Diagnosis Date   Allergy    Asthma    no current problems, does not use inhaler   Diabetes mellitus without complication (HCC)    type 2   Essential hypertension    Hyperlipidemia    Stroke (HCC)     PAST SURGICAL HISTORY: Past Surgical History:  Procedure Laterality Date   APPENDECTOMY  1985   exploratory laparoscopy     WISDOM TOOTH EXTRACTION      ALLERGIES: Allergies  Allergen Reactions   Valsartan Diarrhea   Ozempic  (0.25 Or 0.5 Mg-Dose) [Semaglutide (0.25 Or 0.5mg -Dos)] Nausea And Vomiting    Multiple episodes of severe vomiting on Ozempic    Atorvastatin Other (See Comments)    Elevated sugars   Codeine Nausea And Vomiting   Metformin And Related Diarrhea    FAMILY HISTORY:  Family History  Problem Relation Age of Onset   Memory loss Mother    Breast cancer Mother 71   Stroke Father    Breast cancer Maternal Aunt 74   Colon cancer Neg Hx    Rectal cancer Neg Hx    Stomach cancer Neg Hx    Esophageal cancer Neg Hx     SOCIAL HISTORY: Social History   Socioeconomic History   Marital status: Married    Spouse name: Bambi Lever   Number of children: 2   Years of education: 16   Highest education level: Bachelor's degree (e.g., BA, AB, BS)  Occupational History   Occupation: Self Employed  Tobacco Use   Smoking status: Never    Passive exposure:  Never   Smokeless tobacco: Never  Vaping Use   Vaping status: Never Used  Substance and Sexual Activity   Alcohol use: Yes    Alcohol/week: 0.0 standard drinks of alcohol    Comment: occasional wine   Drug use: No   Sexual activity: Yes    Birth control/protection: Post-menopausal  Other Topics Concern   Not on file  Social History Narrative   Lives with husband and 2 kids   Caffeine use: Drinks 2-3 cups coffee per day   Social Drivers of Health   Financial Resource Strain: Medium Risk (12/09/2023)   Overall Financial Resource Strain (CARDIA)    Difficulty of Paying Living Expenses: Somewhat hard  Food Insecurity: Food Insecurity Present (12/09/2023)   Hunger Vital Sign    Worried About Programme researcher, broadcasting/film/video in  the Last Year: Sometimes true    Ran Out of Food in the Last Year: Sometimes true  Transportation Needs: No Transportation Needs (12/09/2023)   PRAPARE - Administrator, Civil Service (Medical): No    Lack of Transportation (Non-Medical): No  Physical Activity: Insufficiently Active (12/09/2023)   Exercise Vital Sign    Days of Exercise per Week: 2 days    Minutes of Exercise per Session: 20 min  Stress: Stress Concern Present (12/09/2023)   [employer] of Occupational Health - Occupational Stress Questionnaire    Feeling of Stress : Rather much  Social Connections: Socially Integrated (12/09/2023)   Social Connection and Isolation Panel [NHANES]    Frequency of Communication with Friends and Family: Twice a week    Frequency of Social Gatherings with Friends and Family: Once a week    Attends Religious Services: 1 to 4 times per year    Active Member of [employer] or Organizations: No    Attends Engineer, structural: More than 4 times per year    Marital Status: Married    MEDICATIONS:  Current Outpatient Medications  Medication Sig Dispense Refill   albuterol  (PROVENTIL ) (2.5 MG/3ML) 0.083% nebulizer solution Take 2.5 mg by nebulization every 4 (four)  hours as needed for shortness of breath or wheezing.     amLODipine  (NORVASC ) 10 MG tablet TAKE 1 TABLET BY MOUTH EVERY DAY 90 tablet 1   aspirin  EC 81 MG tablet Take 1 tablet (81 mg total) by mouth daily. Swallow whole. 30 tablet 12   budesonide-formoterol (SYMBICORT ) 160-4.5 MCG/ACT inhaler Inhale 2 puffs into the lungs in the morning and at bedtime. 30.6 g 12   buPROPion  (WELLBUTRIN  XL) 150 MG 24 hr tablet TAKE 1 TABLET BY MOUTH EVERY DAY 90 tablet 0   Continuous Glucose Sensor (DEXCOM G7 SENSOR) MISC Use to check glucose continuously, change sensor every 10 days     doxycycline  (VIBRAMYCIN ) 50 MG capsule TAKE 1 CAPSULE BY MOUTH EVERY DAY 30 capsule 5   ezetimibe  (ZETIA ) 10 MG tablet TAKE 1 TABLET BY MOUTH EVERY DAY 90 tablet 1   gabapentin  (NEURONTIN ) 300 MG capsule TAKE 1 CAPSULE BY MOUTH THREE TIMES A DAY 84 capsule 1   insulin  glargine-yfgn (SEMGLEE ) 100 UNIT/ML Pen INJECT 64 UNITS INTO THE SKIN DAILY. 30 mL 1   Insulin  Pen Needle (BD PEN NEEDLE NANO 2ND GEN) 32G X 4 MM MISC Use to inject insulin  a total of 4 times daily. 400 each 2   levothyroxine  (SYNTHROID ) 25 MCG tablet Take 1 tablet (25 mcg total) by mouth daily. 90 tablet 3   losartan  (COZAAR ) 100 MG tablet TAKE 1 TABLET BY MOUTH EVERY DAY 90 tablet 1   metroNIDAZOLE  (METROCREAM ) 0.75 % cream Apply topically 2 (two) times daily. 45 g 3   omeprazole  (PRILOSEC) 20 MG capsule TAKE 1 CAPSULE BY MOUTH EVERY DAY 90 capsule 1   sertraline  (ZOLOFT ) 100 MG tablet TAKE 1 TABLET BY MOUTH EVERY DAY 90 tablet 1   Tiotropium Bromide Monohydrate  (SPIRIVA  RESPIMAT) 1.25 MCG/ACT AERS Inhale 2 puffs into the lungs daily. 12 g 0   VENTOLIN  HFA 108 (90 Base) MCG/ACT inhaler Inhale 2 puffs into the lungs every 4 (four) hours as needed for wheezing or shortness of breath. 1 each 2   Continuous Glucose Sensor (DEXCOM G7 SENSOR) MISC Use to check blood glucose throughout the day. Changes sensors once every 10 days. 9 each 3   empagliflozin  (JARDIANCE ) 25 MG  TABS tablet  Take 1 tablet (25 mg total) by mouth daily before breakfast. 90 tablet 3   insulin  aspart (NOVOLOG  FLEXPEN) 100 UNIT/ML FlexPen Inject 5-10 Units into the skin 3 (three) times daily with meals. Plus sliding scale as instructed.  Maximum 40 units/day. 30 mL 3   No current facility-administered medications for this visit.    PHYSICAL EXAM: Vitals:   05/04/24 0847  BP: 130/78  Pulse: 78  Resp: 20  SpO2: 96%  Weight: 232 lb 12.8 oz (105.6 kg)  Height: 5\' 5"  (1.651 m)   Body mass index is 38.74 kg/m.  Wt Readings from Last 3 Encounters:  05/04/24 232 lb 12.8 oz (105.6 kg)  04/17/24 234 lb 9.6 oz (106.4 kg)  04/04/24 235 lb (106.6 kg)    General: Well developed, well nourished female in no apparent distress.  HEENT: AT/[place], no external lesions.  Eyes: Conjunctiva clear and no icterus. Neck: Neck supple  Lungs: Respirations not labored Neurologic: Alert, oriented, normal speech Extremities / Skin: Dry.   Psychiatric: Does not appear depressed or anxious  Diabetic Foot Exam - Simple   Simple Foot Form Diabetic Foot exam was performed with the following findings: Yes 05/04/2024  9:06 AM  Visual Inspection No deformities, no ulcerations, no other skin breakdown bilaterally: Yes Sensation Testing Intact to touch and monofilament testing bilaterally: Yes See comments: Yes Pulse Check Posterior Tibialis and Dorsalis pulse intact bilaterally: Yes Comments Left great toe mild decrease sensory on monofilament exam.    LABS Reviewed Lab Results  Component Value Date   HGBA1C 9.0 (A) 04/17/2024   HGBA1C 10.0 (A) 12/13/2023   HGBA1C 10.4 (A) 09/17/2023   No results found for: "FRUCTOSAMINE" Lab Results  Component Value Date   CHOL 267 (H) 10/04/2023   HDL 66 10/04/2023   LDLCALC 153 (H) 10/04/2023   TRIG 266 (H) 10/04/2023   CHOLHDL 4.0 10/04/2023   Lab Results  Component Value Date   MICRALBCREAT 7 05/14/2023   MICRALBCREAT 8 04/19/2020   Lab Results   Component Value Date   CREATININE 0.67 10/01/2023   No results found for: "GFR"  ASSESSMENT / PLAN  1. Uncontrolled type 2 diabetes mellitus with hyperglycemia (HCC)   2. Diabetes mellitus type 2 with complications (HCC)   3. Subclinical hypothyroidism   4. Type 2 diabetes mellitus without complication, without long-term current use of insulin  (HCC)   5. Type 2 diabetes mellitus with other specified complication, without long-term current use of insulin  (HCC)     Diabetes Mellitus type 2, complicated by stroke/peripheral neuropathy. - Diabetic status / severity: Uncontrolled.  Lab Results  Component Value Date   HGBA1C 9.0 (A) 04/17/2024    - Hemoglobin A1c goal : <6.5%  Diabetes control improving.  GMI on CGM 7.2%.  Discussed about type 2 diabetes mellitus and potential chronic complications including diabetic retinopathy, neuropathy and nephropathy.  Discussed about importance of controlling blood sugar to prevent these complications.  Patient has remained uncontrolled diabetes for few years.  She reports intolerance to metformin and Ozempic  in the past.  She will benefit from weight loss diabetic medication including GLP-1 receptor agonist, however with the prior severe side effect from Ozempic  will not try at this time.  - Medications: See below. Diabetes regimen Continue Semglee  64 units daily in the morning. Okay to adjust NovoLog  based on meal size from 5-10 units.    Moderate Sliding Scale for blood sugar before eating. Blood Glucose        Insulin  60-150  None 151-200                   3 units 201-250                   5 units 251-300                   7 units 301-350                   9 units 351-400                  11 units    >400                       12 units and call provider  3.  Deprescribed Jardiance  25 mg daily.  Will hydrate with water every day.  Asked to check with the pharmacy.  - Home glucose testing: Continue Dexcom G7  and check as needed.  - Discussed/ Gave Hypoglycemia treatment plan.  # Consult : Offered dietitian visit, patient declined.  # Annual urine for microalbuminuria/ creatinine ratio, no microalbuminuria currently, continue ACE/ARB /losartan .  Will check urine microalbumin creatinine ratio along with BMP with EGFR today. Last  Lab Results  Component Value Date   MICRALBCREAT 7 05/14/2023    # Foot check nightly / neuropathy, on gabapentin  from neurology.  # Annual dilated diabetic eye exams.   - Diet: Make healthy diabetic food choices.  Discussed in detail about diet plan including avoiding snacks, limiting carbohydrates, and increase amount of vegetables and grains. - Life style / activity / exercise: Discussed.  2. Blood pressure  -  BP Readings from Last 1 Encounters:  05/04/24 130/78    - Control is in target.  - No change in current plans.  3. Lipid status / Hyperlipidemia - Last  Lab Results  Component Value Date   LDLCALC 153 (H) 10/04/2023   -Following with cardiology and lipid clinic, planning for Repatha. ?  Had statin intolerance in the past.  # Subclinical hypothyroidism : - Levothyroxine  25 mcg daily started in March.  Had mild elevated TSH at that time. - Check TSH, free T4 today and will adjust the dose of levothyroxine  as needed.   Diagnoses and all orders for this visit:  Uncontrolled type 2 diabetes mellitus with hyperglycemia (HCC)  Diabetes mellitus type 2 with complications (HCC) -     Continuous Glucose Sensor (DEXCOM G7 SENSOR) MISC; Use to check glucose continuously, change sensor every 10 days -     empagliflozin  (JARDIANCE ) 25 MG TABS tablet; Take 1 tablet (25 mg total) by mouth daily before breakfast. -     Microalbumin / creatinine urine ratio -     Basic metabolic panel with GFR  Subclinical hypothyroidism -     T4, free -     TSH  Type 2 diabetes mellitus without complication, without long-term current use of insulin  (HCC) -      Continuous Glucose Sensor (DEXCOM G7 SENSOR) MISC; Use to check blood glucose throughout the day. Changes sensors once every 10 days.  Type 2 diabetes mellitus with other specified complication, without long-term current use of insulin  (HCC) -     insulin  aspart (NOVOLOG  FLEXPEN) 100 UNIT/ML FlexPen; Inject 5-10 Units into the skin 3 (three) times daily with meals. Plus sliding scale as instructed.  Maximum 40 units/day.   DISPOSITION Follow up in clinic in 3 months suggested.  Labs today  as ordered.   All questions answered and patient verbalized understanding of the plan.  Jessica Tracie Lindbloom, MD [facility] [address] [place], [address] [place], [place] [address] Phone # [phone]  At least part of this note was generated using voice recognition software. Inadvertent word errors may have occurred, which were not recognized during the proofreading process.

## 2024-05-04 NOTE — Patient Instructions (Signed)
 Diabetes regimen Semglee  64 units daily in the morning.  Okay to adjust NovoLog  based on meal size from 5-10 units.   Moderate Sliding Scale for blood sugar before eating. Blood Glucose        Insulin  60-150                     None 151-200                   3 units 201-250                   5 units 251-300                   7 units 301-350                   9 units 351-400                  11 units    >400                       12 units and call provider  3.  Start Jardiance  25 mg daily.  Will hydrate with water every day. Check with pharmacy.

## 2024-05-05 ENCOUNTER — Ambulatory Visit: Payer: Self-pay | Admitting: Endocrinology

## 2024-05-05 LAB — MICROALBUMIN / CREATININE URINE RATIO
Creatinine, Urine: 40 mg/dL (ref 20–275)
Microalb, Ur: <0.2 mg/dL

## 2024-05-05 LAB — BASIC METABOLIC PANEL WITH GFR
BUN: 17 mg/dL (ref 7–25)
CO2: 26 mmol/L (ref 20–32)
Calcium: 10 mg/dL (ref 8.6–10.4)
Chloride: 105 mmol/L (ref 98–110)
Creat: 0.96 mg/dL (ref 0.50–1.03)
Glucose, Bld: 127 mg/dL — ABNORMAL HIGH (ref 65–99)
Potassium: 4.3 mmol/L (ref 3.5–5.3)
Sodium: 141 mmol/L (ref 135–146)
eGFR: 69 mL/min/{1.73_m2} (ref >=60)

## 2024-05-05 LAB — T4, FREE: Free T4: 1 ng/dL (ref 0.8–1.8)

## 2024-05-05 LAB — TSH: TSH: 3.38 m[IU]/L (ref 0.40–4.50)

## 2024-05-11 ENCOUNTER — Other Ambulatory Visit (HOSPITAL_COMMUNITY): Payer: Self-pay

## 2024-05-11 ENCOUNTER — Other Ambulatory Visit: Payer: Self-pay | Admitting: Family Medicine

## 2024-05-11 ENCOUNTER — Telehealth: Payer: Self-pay | Admitting: Pharmacy Technician

## 2024-05-11 DIAGNOSIS — I10 Essential (primary) hypertension: Secondary | ICD-10-CM

## 2024-05-11 NOTE — Telephone Encounter (Signed)
 Pharmacy Patient Advocate Encounter   Received notification from CoverMyMeds that prior authorization for Jardiance  25MG  tablets is required/requested.   Insurance verification completed.   The patient is insured through [facility] .   Per test claim: PA required; PA submitted to above mentioned insurance via CoverMyMeds Key/confirmation #/EOC [id number] Status is pending

## 2024-05-11 NOTE — Telephone Encounter (Signed)
 Pharmacy Patient Advocate Encounter  Received notification from [facility] that Prior Authorization for Jardiance  25MG  tablets has been APPROVED from 05/11/2024 to 05/11/2025. Ran test claim, Copay is $4.00. This test claim was processed through [facility]- copay amounts may vary at other pharmacies due to pharmacy/plan contracts, or as the patient moves through the different stages of their insurance plan.   PA #/Case ID/Reference #: [id number]

## 2024-05-12 ENCOUNTER — Telehealth: Payer: Self-pay

## 2024-05-12 NOTE — Telephone Encounter (Signed)
 Copied from CRM [id number]. Topic: General - Other >> May 11, 2024  3:52 PM Felizardo Hotter wrote: Reason for CRM: Pt called to provided mail pharmacy to: [facility] Fax: [phone], ph: [phone] ext. [phone]. Jessica Ponce will call to transfer medications.

## 2024-05-15 ENCOUNTER — Other Ambulatory Visit: Payer: Self-pay | Admitting: Family Medicine

## 2024-05-15 ENCOUNTER — Ambulatory Visit
Admission: RE | Admit: 2024-05-15 | Discharge: 2024-05-15 | Disposition: A | Discharge status: Home / Self Care | Source: Ambulatory Visit

## 2024-05-15 ENCOUNTER — Ambulatory Visit: Admitting: Pulmonary Disease

## 2024-05-15 DIAGNOSIS — I10 Essential (primary) hypertension: Secondary | ICD-10-CM

## 2024-05-15 DIAGNOSIS — Z1231 Encounter for screening mammogram for malignant neoplasm of breast: Secondary | ICD-10-CM

## 2024-05-15 NOTE — Telephone Encounter (Signed)
 Patient wanting medication changed to pharmacy listed below

## 2024-05-16 ENCOUNTER — Ambulatory Visit: Admitting: Pulmonary Disease

## 2024-05-16 ENCOUNTER — Encounter: Payer: Self-pay | Admitting: Pulmonary Disease

## 2024-05-16 VITALS — BP 116/70 | HR 74 | Ht 65.0 in | Wt 229.5 lb

## 2024-05-16 DIAGNOSIS — K219 Gastro-esophageal reflux disease without esophagitis: Secondary | ICD-10-CM

## 2024-05-16 DIAGNOSIS — J4550 Severe persistent asthma, uncomplicated: Secondary | ICD-10-CM

## 2024-05-16 DIAGNOSIS — G4733 Obstructive sleep apnea (adult) (pediatric): Secondary | ICD-10-CM | POA: Diagnosis not present

## 2024-05-16 DIAGNOSIS — R0602 Shortness of breath: Secondary | ICD-10-CM

## 2024-05-16 LAB — NITRIC OXIDE: Nitric Oxide: 22

## 2024-05-16 NOTE — Progress Notes (Addendum)
 Synopsis: Referred in by Abraham Abo, MD   Subjective:   PATIENT ID: Jessica Ponce GENDER: female DOB: December 24, 1965, MRN: [id number]  Chief Complaint  Patient presents with   Asthma    HPI Jessica Ponce is a pleasant 58 year old female patient with a past medical history of mild intermittent asthma, hypertension, depression on Wellbutrin  presenting to the pulmonary clinic for further evaluation of her asthma.  She said she contracted COVID in end of August September she started having worsening symptoms.  Described as chest tightness and inability to take in deep breath.  Also reports dry cough persistent throughout the day.  She was started on prednisone  taper by her primary care physician. She also been using albuterol  nebulizer and albuterol  inhaler as needed on a daily basis.  Completed steroid taper. Feeling better but not fully back to baseline. Not using her rescue inhaler.   She was diagnosed with asthma as a child, before that episode has been only on albuterol  inhaler as needed.  She has never been hospitalized or intubated for asthma.  Her triggers are mostly viral infections but also she is somewhat sensitive to strong scents and high humidity.  In the interim, she was seen by colleague Dr. Viva Grise who added LAMA to her regimen.   PFTs 10/2023 with signs of gas trapping and decreased PEFR  w/ nl DLCO all consistent with asthma.   Allergen Panel 2024 was negative/   Family history - she denies any family history of asthma  Social history - never smoker denies alcohol use and denies illicit drug use.  Works as a Lawyer  OV 05/16/2024 - Jessica Ponce is here to follow up on her asthma. She is doing overall well. Has no major issues. Continues on triple inhaler therapy. ACT 21. FENO 22.   Review of Systems  Eyes:  Positive for photophobia.   All systems were reviewed and are negative except for the above. Objective:   Vitals:   05/16/24 0941  BP: 116/70  Pulse: 74   SpO2: 96%  Weight: 229 lb 8 oz (104.1 kg)  Height: 5' 5 (1.651 m)    96% on RA BMI Readings from Last 3 Encounters:  05/16/24 38.19 kg/m  05/04/24 38.74 kg/m  04/17/24 39.04 kg/m   Wt Readings from Last 3 Encounters:  05/16/24 229 lb 8 oz (104.1 kg)  05/04/24 232 lb 12.8 oz (105.6 kg)  04/17/24 234 lb 9.6 oz (106.4 kg)    Physical Exam GEN: NAD, Healthy Appearing HEENT: Supple Neck, Reactive Pupils, EOMI  CVS: Normal S1, Normal S2, RRR, systolic murmur heard best at the right sternal border. Lungs: Clear bilateral air entry.  Abdomen: Soft, non tender, non distended, + BS  Extremities: Warm and well perfused, No edema  Skin: No suspicious lesions appreciated  Psych: Normal Affect  Labs and imaging were reviewed.  Ancillary Information   CBC    Component Value Date/Time   WBC 12.9 (H) 10/01/2023 1723   RBC 4.67 10/01/2023 1723   HGB 13.7 10/01/2023 1723   HGB 13.6 06/26/2021 0921   HCT 40.7 10/01/2023 1723   HCT 40.8 06/26/2021 0921   PLT 352 10/01/2023 1723   PLT 328 06/26/2021 0921   MCV 87.2 10/01/2023 1723   MCV 88 06/26/2021 0921   MCH 29.3 10/01/2023 1723   MCHC 33.7 10/01/2023 1723   RDW 13.2 10/01/2023 1723   RDW 13.0 06/26/2021 0921   LYMPHSABS 3.8 10/01/2023 1723   LYMPHSABS 3.3 (H) 06/26/2021 9147  MONOABS 0.4 10/01/2023 1723   EOSABS 0.5 10/01/2023 1723   EOSABS 0.3 06/26/2021 0921   BASOSABS 0.1 10/01/2023 1723   BASOSABS 0.1 06/26/2021 0921   Labs and imaging were reviewed.     Latest Ref Rng & Units 11/02/2023    2:26 PM  PFT Results  FVC-Pre L 2.32   FVC-Predicted Pre % 66   FVC-Post L 2.43   FVC-Predicted Post % 69   Pre FEV1/FVC % % 78   Post FEV1/FCV % % 79   FEV1-Pre L 1.82   FEV1-Predicted Pre % 67   FEV1-Post L 1.93   DLCO uncorrected ml/min/mmHg 17.23   DLCO UNC% % 81   DLVA Predicted % 100   TLC L 4.71   TLC % Predicted % 90   RV % Predicted % 117     Assessment & Plan:  Jessica Ponce is a pleasant 58 year old female  patient with a past medical history of mild intermittent asthma, hypertension, depression on Wellbutrin  presenting to the pulmonary clinic for further evaluation of her asthma.  #Severe persistent asthma  EOS 500 Allergen panel is negative and IgE 6.  FENO 54 --> 41 12/5 --> 22 04/2024 (indicating improvement and compliance with therapy)   ACT 12/05 - 16. --> 20 01/2024 --> 21 04/2024  Exacerbated by a viral infection. No eczema or allergic rhinitis. No hypersensitivites to cold air or humidity.   []  C/w Budesonide-Formoterol [Symbicort ] 160-4.5 2 puffs BID. Advised mouth rinse post each use.  []  C/w Spiriva  daily  []  C/w Albuterol  2puffs Q6H as needed  []  Obtain Antigen panel and total IgE.  []  Agreed to hold off on referral to [facility] asthma/allergy for now until we reassess in 3 months.   #OSA on CPAP compliant.  #GERD on pantoprazole  40mg  PO daily  RTC 6 months.  I spent 30 minutes caring for this patient today, including preparing to see the patient, obtaining a medical history , reviewing a separately obtained history, performing a medically appropriate examination and/or evaluation, counseling and educating the patient/family/caregiver, ordering medications, tests, or procedures, documenting clinical information in the electronic health record, and independently interpreting results (not separately reported/billed) and communicating results to the patient/family/caregiver  Annitta Kindler, MD [facility] 05/16/2024 9:51 AM

## 2024-05-17 NOTE — Telephone Encounter (Signed)
 Called patient to verify which medication she wanted transferred and she stated that she wanted all medication to be sent to the pharmacy listed below please

## 2024-05-19 ENCOUNTER — Other Ambulatory Visit: Payer: Self-pay | Admitting: Family Medicine

## 2024-05-19 NOTE — Telephone Encounter (Unsigned)
 Copied from CRM [id number]. Topic: Clinical - Medication Refill >> May 19, 2024  1:52 PM Zipporah Him wrote: Medication: buPROPion  (WELLBUTRIN  XL) 150 MG 24 hr tablet,   Has the patient contacted their pharmacy? Yes (Agent: If no, request that the patient contact the pharmacy for the refill. If patient does not wish to contact the pharmacy document the reason why and proceed with request.) (Agent: If yes, when and what did the pharmacy advise?)  This is the patient's preferred pharmacy:   [facility] [place] - [address] [place] [address] Phone: [phone] Fax: [phone]  Is this the correct pharmacy for this prescription? Yes If no, delete pharmacy and type the correct one.   Has the prescription been filled recently? Yes  Is the patient out of the medication? No, four days left  Has the patient been seen for an appointment in the last year OR does the patient have an upcoming appointment? Yes  Can we respond through MyChart? Yes  Agent: Please be advised that Rx refills may take up to 3 business days. We ask that you follow-up with your pharmacy.

## 2024-05-21 ENCOUNTER — Encounter: Payer: Self-pay | Admitting: Family Medicine

## 2024-05-23 NOTE — Telephone Encounter (Signed)
 Requested medications are due for refill today.  Pt wants rx sent to a different pharmacy  Requested medications are on the active medications list.  yes  Last refill. 05/01/2024 #[phone] 0 rf  Future visit scheduled.   yes  Notes to clinic.  Labs are expired.    Requested Prescriptions  Pending Prescriptions Disp Refills   buPROPion  (WELLBUTRIN  XL) 150 MG 24 hr tablet 90 tablet 0    Sig: Take 1 tablet (150 mg total) by mouth daily.     Psychiatry: Antidepressants - bupropion  Failed - 05/23/2024 10:16 AM      Failed - AST in normal range and within 360 days    AST  Date Value Ref Range Status  03/21/2023 20 15 - 41 U/L Final         Failed - ALT in normal range and within 360 days    ALT  Date Value Ref Range Status  03/21/2023 20 0 - 44 U/L Final         Passed - Cr in normal range and within 360 days    Creat  Date Value Ref Range Status  05/04/2024 0.96 0.50 - 1.03 mg/dL Final   Creatinine, Urine  Date Value Ref Range Status  05/04/2024 40 20 - 275 mg/dL Final         Passed - Last BP in normal range    BP Readings from Last 1 Encounters:  05/16/24 116/70         Passed - Valid encounter within last 6 months    Recent Outpatient Visits           1 month ago Type 2 diabetes mellitus with hyperglycemia, with long-term current use of insulin  [facility])   [facility], MD   4 months ago Type 2 diabetes mellitus without complication, without long-term current use of insulin  [facility])   [facility] [place] Fleeta Tonia Senior L, RPH-CPP   4 months ago Type 2 diabetes mellitus with hyperglycemia, with long-term current use of insulin  [facility])   [facility], MD   5 months ago Mixed hyperlipidemia   [facility] Of [place] Fleeta Tonia, [place] L, RPH-CPP   6 months ago Type 2 diabetes  mellitus with hyperglycemia, with long-term current use of insulin  [facility])   [facility] Of [place]. [facility] Fleeta Tonia Senior LITTIE, RPH-CPP

## 2024-05-24 MED ORDER — BUPROPION HCL ER (XL) 150 MG PO TB24: 150.0000 mg | ORAL_TABLET | Freq: Every day | ORAL | 0 refills | Status: DC

## 2024-06-07 ENCOUNTER — Ambulatory Visit: Admitting: Family Medicine

## 2024-06-07 ENCOUNTER — Encounter: Payer: Self-pay | Admitting: Family Medicine

## 2024-06-09 ENCOUNTER — Ambulatory Visit
Admission: RE | Admit: 2024-06-09 | Discharge: 2024-06-09 | Disposition: A | Discharge status: Home / Self Care | Source: Ambulatory Visit | Attending: Physician Assistant | Admitting: Physician Assistant

## 2024-06-09 ENCOUNTER — Ambulatory Visit: Payer: Self-pay | Admitting: Physician Assistant

## 2024-06-09 ENCOUNTER — Ambulatory Visit (HOSPITAL_COMMUNITY)
Admission: RE | Admit: 2024-06-09 | Discharge: 2024-06-09 | Disposition: A | Discharge status: Home / Self Care | Source: Ambulatory Visit | Attending: Physician Assistant | Admitting: Physician Assistant

## 2024-06-09 VITALS — BP 127/79 | HR 80 | Temp 98.3°F | Resp 16

## 2024-06-09 DIAGNOSIS — M79605 Pain in left leg: Secondary | ICD-10-CM | POA: Insufficient documentation

## 2024-06-09 DIAGNOSIS — M7989 Other specified soft tissue disorders: Secondary | ICD-10-CM | POA: Diagnosis not present

## 2024-06-09 DIAGNOSIS — M79652 Pain in left thigh: Secondary | ICD-10-CM

## 2024-06-09 MED ORDER — BACLOFEN 10 MG PO TABS: 10.0000 mg | ORAL_TABLET | Freq: Two times a day (BID) | ORAL | 0 refills | Status: DC | PRN

## 2024-06-09 NOTE — Discharge Instructions (Addendum)
 Start baclofen  up to twice a day for muscle spasm and pain.  This week you sleepy do not drive or drink alcohol with taking it.  Please go get the ultrasound as we discussed.  We will make additional recommendations if this is positive.  If you feel any rash, worsening pain, swelling, chest pain, shortness of breath, heart racing you should be reevaluated immediately.  Follow-up with your primary care as soon as possible.

## 2024-06-09 NOTE — ED Provider Notes (Signed)
 [name]    CSN: [id number] Arrival date & time: 06/09/24  1051      History   Chief Complaint Chief Complaint  Patient presents with   Leg Pain    HPI Jessica Ponce is a 58 y.o. female.   Patient presents today with a 2-day history of left upper leg pain with radiation into her calf.  She reports that pain is rated a 4 on a certain pain scale, described as cramping with tenderness to palpation.  She has noticed associated swelling prompting evaluation as she wanted to make sure that she did not have something like a blood clot.  She denies any risk factors for blood clot including immobilization, hospitalization, surgical procedure, exogenous,, active malignancy, recent COVID-19 diagnosis.  Denies any shortness of breath, chest pain, palpitations.  She denies any known injury or recent trauma to the area.  She is able to ambulate without assistance.  She denies any associated rash.  She is having difficulty with her daily activities as even resting the laptop on her leg increases the pain.    Past Medical History:  Diagnosis Date   Allergy    Asthma    no current problems, does not use inhaler   Diabetes mellitus without complication (HCC)    type 2   Essential hypertension    Hyperlipidemia    Stroke [facility])     Patient Active Problem List   Diagnosis Date Noted   Anxiety 08/26/2023   Family history of malignant melanoma of skin 08/01/2023   Complicated migraine 03/21/2023   Acute ischemic stroke (HCC) 05/27/2022   Type 2 diabetes mellitus with hyperglycemia (HCC) 10/07/2021   SVT (supraventricular tachycardia) (HCC) 10/07/2021   Family history of thyroid  disorder 07/15/2020   Mixed hyperlipidemia 04/23/2020   Family history of breast cancer 03/11/2020   Type 2 diabetes mellitus (HCC) 03/11/2020   Hypertension, essential 07/31/2016   Family history of Alzheimer's disease 08/04/2009    Past Surgical History:  Procedure Laterality Date   APPENDECTOMY   1985   exploratory laparoscopy     WISDOM TOOTH EXTRACTION      OB History   No obstetric history on file.      Home Medications    Prior to Admission medications   Medication Sig Start Date End Date Taking? Authorizing Provider  baclofen  (LIORESAL ) 10 MG tablet Take 1 tablet (10 mg total) by mouth 2 (two) times daily as needed for muscle spasms. 06/09/24  Yes Consuela Widener K, PA-C  albuterol  (PROVENTIL ) (2.5 MG/3ML) 0.083% nebulizer solution Take 2.5 mg by nebulization every 4 (four) hours as needed for shortness of breath or wheezing.    [provider]  amLODipine  (NORVASC ) 10 MG tablet TAKE 1 TABLET BY MOUTH EVERY DAY 05/15/24   Tanda Bleacher, MD  aspirin  EC 81 MG tablet Take 1 tablet (81 mg total) by mouth daily. Swallow whole. 05/30/22   Remi Pippin, NP  budesonide-formoterol (SYMBICORT ) 160-4.5 MCG/ACT inhaler Inhale 2 puffs into the lungs in the morning and at bedtime. 10/06/23   Assaker, Darrin, MD  buPROPion  (WELLBUTRIN  XL) 150 MG 24 hr tablet Take 1 tablet (150 mg total) by mouth daily. 05/24/24   Tanda Bleacher, MD  Continuous Glucose Sensor (DEXCOM G7 SENSOR) MISC Use to check blood glucose throughout the day. Changes sensors once every 10 days. 05/04/24   Thapa, [place], MD  doxycycline  (VIBRAMYCIN ) 50 MG capsule TAKE 1 CAPSULE BY MOUTH EVERY DAY 04/10/24   Paci, Karina M, MD  empagliflozin  (JARDIANCE ) 25 MG TABS tablet Take 1 tablet (25 mg total) by mouth daily before breakfast. 05/04/24   Thapa, [place], MD  ezetimibe  (ZETIA ) 10 MG tablet TAKE 1 TABLET BY MOUTH EVERY DAY 01/05/24   Newlin, Enobong, MD  gabapentin  (NEURONTIN ) 300 MG capsule TAKE 1 CAPSULE BY MOUTH THREE TIMES A DAY 08/14/22   Tanda Bleacher, MD  insulin  aspart (NOVOLOG  FLEXPEN) 100 UNIT/ML FlexPen Inject 5-10 Units into the skin 3 (three) times daily with meals. Plus sliding scale as instructed.  Maximum 40 units/day. 05/04/24   Thapa, [place], MD  insulin  glargine-yfgn (SEMGLEE ) 100 UNIT/ML Pen INJECT 64 UNITS  INTO THE SKIN DAILY. 05/01/24   Thapa, [place], MD  Insulin  Pen Needle (BD PEN NEEDLE NANO 2ND GEN) 32G X 4 MM MISC Use to inject insulin  a total of 4 times daily. 12/13/23   Newlin, Enobong, MD  levothyroxine  (SYNTHROID ) 25 MCG tablet Take 1 tablet (25 mcg total) by mouth daily. 02/09/24   Thapa, [place], MD  losartan  (COZAAR ) 100 MG tablet TAKE 1 TABLET BY MOUTH EVERY DAY 05/11/24   Tanda Bleacher, MD  metroNIDAZOLE  (METROCREAM ) 0.75 % cream Apply topically 2 (two) times daily. 09/09/23 09/08/24  Paci, Karina M, MD  omeprazole  (PRILOSEC) 20 MG capsule TAKE 1 CAPSULE BY MOUTH EVERY DAY 10/07/23   Tanda Bleacher, MD  sertraline  (ZOLOFT ) 100 MG tablet TAKE 1 TABLET BY MOUTH EVERY DAY 02/15/24   Tanda Bleacher, MD  Tiotropium Bromide Monohydrate  (SPIRIVA  RESPIMAT) 1.25 MCG/ACT AERS Inhale 2 puffs into the lungs daily. 01/07/24   Tamea Dedra CROME, MD  VENTOLIN  HFA 108 (90 Base) MCG/ACT inhaler Inhale 2 puffs into the lungs every 4 (four) hours as needed for wheezing or shortness of breath. 01/11/24   Tanda Bleacher, MD    Family History Family History  Problem Relation Age of Onset   Memory loss Mother    Breast cancer Mother 80   Stroke Father    Breast cancer Maternal Aunt 47   Breast cancer Paternal Grandmother 28 - 89   Colon cancer Neg Hx    Rectal cancer Neg Hx    Stomach cancer Neg Hx    Esophageal cancer Neg Hx     Social History Social History   Tobacco Use   Smoking status: Never    Passive exposure: Never   Smokeless tobacco: Never  Vaping Use   Vaping status: Never Used  Substance Use Topics   Alcohol use: Yes    Alcohol/week: 0.0 standard drinks of alcohol    Comment: occasional wine   Drug use: No     Allergies   Valsartan, Ozempic  (0.25 or 0.5 mg-dose) [semaglutide (0.25 or 0.5mg -dos)], Atorvastatin, Codeine, and Metformin and related   Review of Systems Review of Systems  Constitutional:  Positive for activity change. Negative for appetite change, fatigue and fever.   Respiratory:  Negative for shortness of breath.   Cardiovascular:  Positive for leg swelling. Negative for chest pain and palpitations.  Musculoskeletal:  Positive for myalgias. Negative for arthralgias.  Skin:  Negative for rash.  Neurological:  Negative for weakness and numbness.     Physical Exam Triage Vital Signs ED Triage Vitals [06/09/24 1114]  Encounter Vitals Group     BP 127/79     Girls Systolic BP Percentile      Girls Diastolic BP Percentile      Boys Systolic BP Percentile      Boys Diastolic BP Percentile      Pulse Rate 80  Resp 16     Temp 98.3 F (36.8 C)     Temp Source Oral     SpO2 95 %     Weight      Height      Head Circumference      Peak Flow      Pain Score 3     Pain Loc      Pain Education      Exclude from Growth Chart    No data found.  Updated Vital Signs BP 127/79 (BP Location: Left Arm)   Pulse 80   Temp 98.3 F (36.8 C) (Oral)   Resp 16   LMP 12/10/2015 (LMP Unknown)   SpO2 95%   Visual Acuity Right Eye Distance:   Left Eye Distance:   Bilateral Distance:    Right Eye Near:   Left Eye Near:    Bilateral Near:     Physical Exam Vitals reviewed.  Constitutional:      General: She is awake. She is not in acute distress.    Appearance: Normal appearance. She is well-developed. She is not ill-appearing.     Comments: Very pleasant female appears stated age in no acute distress sitting comfortably in exam room  HENT:     Head: Normocephalic and atraumatic.  Cardiovascular:     Rate and Rhythm: Normal rate and regular rhythm.     Heart sounds: Normal heart sounds, S1 normal and S2 normal. No murmur heard.    Comments: Negative Toula' sign on the left. Pulmonary:     Effort: Pulmonary effort is normal.     Breath sounds: Normal breath sounds. No wheezing, rhonchi or rales.     Comments: Clear to auscultation bilaterally Musculoskeletal:     Left upper leg: Swelling and tenderness present. No bony tenderness.      Right lower leg: No edema.     Left lower leg: Tenderness present. No deformity, lacerations or bony tenderness. No edema.       Legs:     Comments: Left leg: Tender to palpation with associated swelling noted anterior upper left thigh.  No associated rash or erythema.  Strength 4/5 bilateral lower extremities.  Negative Toula' sign but patient does have tenderness along gastrocnemius of left leg.  No spasm noted.  No focal bony tenderness on exam.  No deformity.  Psychiatric:        Behavior: Behavior is cooperative.      UC Treatments / Results  Labs (all labs ordered are listed, but only abnormal results are displayed) Labs Reviewed - No data to display  EKG   Radiology VAS US  LOWER EXTREMITY VENOUS (DVT) (ONLY [facility] & [facility]) Result Date: 06/09/2024  Lower Venous DVT Study Patient Name:  ARIYAH SEDLACK  Date of Exam:   06/09/2024 Medical Rec #: [id number]    Accession #:    [id number] Date of Birth: [birth year]    Patient Gender: F Patient Age:   14 years Exam Location:  [address] Procedure:      VAS US  LOWER EXTREMITY VENOUS (DVT) Referring Phys: Kiaja Shorty --------------------------------------------------------------------------------  Indications: Pain. Other Indications: C/o sudden onset yesterday of left anterior thigh pain. No                    known trauma. Risk Factors: Obesity. Comparison Study: None Performing Technologist: [employer] BS, RVT, RDCS  Examination Guidelines: A complete evaluation includes B-mode imaging, spectral Doppler, color Doppler, and power Doppler as needed of all  accessible portions of each vessel. Bilateral testing is considered an integral part of a complete examination. Limited examinations for reoccurring indications may be performed as noted. The reflux portion of the exam is performed with the patient in reverse Trendelenburg.  +-----+---------------+---------+-----------+----------+--------------+  RIGHTCompressibilityPhasicitySpontaneityPropertiesThrombus Aging +-----+---------------+---------+-----------+----------+--------------+ CFV  Full           Yes      Yes                                 +-----+---------------+---------+-----------+----------+--------------+   +---------+---------------+---------+-----------+----------+--------------+ LEFT     CompressibilityPhasicitySpontaneityPropertiesThrombus Aging +---------+---------------+---------+-----------+----------+--------------+ CFV      Full           Yes      Yes                                 +---------+---------------+---------+-----------+----------+--------------+ SFJ      Full           Yes      Yes                                 +---------+---------------+---------+-----------+----------+--------------+ FV Prox  Full           Yes      Yes                                 +---------+---------------+---------+-----------+----------+--------------+ FV Mid   Full           Yes      Yes                                 +---------+---------------+---------+-----------+----------+--------------+ FV DistalFull           Yes      Yes                                 +---------+---------------+---------+-----------+----------+--------------+ PFV      Full           Yes      Yes                                 +---------+---------------+---------+-----------+----------+--------------+ POP      Full           Yes      Yes                                 +---------+---------------+---------+-----------+----------+--------------+ PTV      Full           Yes      Yes                                 +---------+---------------+---------+-----------+----------+--------------+ PERO     Full           Yes      Yes                                 +---------+---------------+---------+-----------+----------+--------------+  Gastroc  Full           Yes      Yes                                  +---------+---------------+---------+-----------+----------+--------------+ GSV      Full                                                        +---------+---------------+---------+-----------+----------+--------------+  Findings reported to Sent through Epic at 2:15PM.  Summary: RIGHT: - No evidence of common femoral vein obstruction.   LEFT: - There is no evidence of deep vein thrombosis in the lower extremity. - There is no evidence of superficial venous thrombosis.  - No cystic structure found in the popliteal fossa.  *See table(s) above for measurements and observations.    Preliminary     Procedures Procedures (including critical care time)  Medications Ordered in UC Medications - No data to display  Initial Impression / Assessment and Plan / UC Course  I have reviewed the triage vital signs and the nursing notes.  Pertinent labs & imaging results that were available during my care of the patient were reviewed by me and considered in my medical decision making (see chart for details).     Patient is well-appearing, afebrile, nontoxic, nontachycardic.  Low suspicion for VTE event but she is concerned about this and given she has associated swelling and pain in her left leg we will obtain a DVT ultrasound which was ordered and scheduled for 2:00 today.  She will go to the DVT clinic if positive.  I did recommend that she use baclofen  for muscle spasm as I believe this is the most likely cause of her symptoms.  We discussed that this can be sedating and she is on her drive drink alcohol with taking it.  Recommended that she push fluids including electrolyte solutions and follow-up with her primary care if her symptoms are not improving.  She does not currently have a rash we discussed that if the rash develops she should return for reevaluation.  Recommend close follow-up with her PCP.  Strict return precautions given.  All questions answered to patient satisfaction.  Final  Clinical Impressions(s) / UC Diagnoses   Final diagnoses:  Left leg pain  Left thigh pain  Left leg swelling     Discharge Instructions      Start baclofen  up to twice a day for muscle spasm and pain.  This week you sleepy do not drive or drink alcohol with taking it.  Please go get the ultrasound as we discussed.  We will make additional recommendations if this is positive.  If you feel any rash, worsening pain, swelling, chest pain, shortness of breath, heart racing you should be reevaluated immediately.  Follow-up with your primary care as soon as possible.     ED Prescriptions     Medication Sig Dispense Auth. Provider   baclofen  (LIORESAL ) 10 MG tablet Take 1 tablet (10 mg total) by mouth 2 (two) times daily as needed for muscle spasms. 20 each Jaxton Casale K, PA-C      PDMP not reviewed this encounter.   Sherrell Rocky POUR, PA-C 06/09/24 1705

## 2024-06-09 NOTE — ED Triage Notes (Signed)
 Pt presents to UC with c/o left upper thigh pain x 1 day. Describes the pain as tender and intermittent. Denies any obvious injuries.

## 2024-06-27 ENCOUNTER — Ambulatory Visit: Admitting: Family Medicine

## 2024-07-01 ENCOUNTER — Ambulatory Visit
Admission: RE | Admit: 2024-07-01 | Discharge: 2024-07-01 | Disposition: A | Discharge status: Home / Self Care | Source: Ambulatory Visit | Attending: Family Medicine | Admitting: Family Medicine

## 2024-07-01 VITALS — BP 148/83 | HR 81 | Temp 97.7°F | Resp 17

## 2024-07-01 DIAGNOSIS — R109 Unspecified abdominal pain: Secondary | ICD-10-CM

## 2024-07-01 LAB — POCT URINE DIPSTICK
Bilirubin, UA: NEGATIVE
Blood, UA: NEGATIVE
Glucose, UA: >=1000 mg/dL — AB
Leukocytes, UA: NEGATIVE
Nitrite, UA: NEGATIVE
POC PROTEIN,UA: NEGATIVE
Spec Grav, UA: 1.015 (ref 1.010–1.025)
Urobilinogen, UA: 0.2 U/dL
pH, UA: 5.5 (ref 5.0–8.0)

## 2024-07-01 NOTE — ED Provider Notes (Signed)
 [facility]    CSN: [id number] Arrival date & time: 07/01/24  1310      History   Chief Complaint Chief Complaint  Patient presents with   Back Pain    Possibly my kidney - Entered by patient    HPI Jessica Ponce is a 58 y.o. female.   Patient presents to clinic for concern of right flank pain that radiates down into her abdomen that started yesterday.  Feels like the area is burning discomfort except she has not had any trauma, falls or bruising.   Has not noticed any rashes or skin changes.  Denies dysuria, hematuria, fever, nausea or vomiting.   Patient is on Jardiance .  GFR 1 month ago was 69.  The history is provided by the patient and medical records.  Back Pain   Past Medical History:  Diagnosis Date   Allergy    Asthma    no current problems, does not use inhaler   Diabetes mellitus without complication (HCC)    type 2   Essential hypertension    Hyperlipidemia    Stroke [facility])     Patient Active Problem List   Diagnosis Date Noted   Anxiety 08/26/2023   Family history of malignant melanoma of skin 08/01/2023   Complicated migraine 03/21/2023   Acute ischemic stroke (HCC) 05/27/2022   Type 2 diabetes mellitus with hyperglycemia (HCC) 10/07/2021   SVT (supraventricular tachycardia) (HCC) 10/07/2021   Family history of thyroid  disorder 07/15/2020   Mixed hyperlipidemia 04/23/2020   Family history of breast cancer 03/11/2020   Type 2 diabetes mellitus (HCC) 03/11/2020   Hypertension, essential 07/31/2016   Family history of Alzheimer's disease 08/04/2009    Past Surgical History:  Procedure Laterality Date   APPENDECTOMY  1985   exploratory laparoscopy     WISDOM TOOTH EXTRACTION      OB History   No obstetric history on file.      Home Medications    Prior to Admission medications   Medication Sig Start Date End Date Taking? Authorizing Provider  albuterol  (PROVENTIL ) (2.5 MG/3ML) 0.083% nebulizer solution Take 2.5 mg by  nebulization every 4 (four) hours as needed for shortness of breath or wheezing.    [provider]  amLODipine  (NORVASC ) 10 MG tablet TAKE 1 TABLET BY MOUTH EVERY DAY 05/15/24   Tanda Bleacher, MD  aspirin  EC 81 MG tablet Take 1 tablet (81 mg total) by mouth daily. Swallow whole. 05/30/22   Remi Pippin, NP  baclofen  (LIORESAL ) 10 MG tablet Take 1 tablet (10 mg total) by mouth 2 (two) times daily as needed for muscle spasms. 06/09/24   Raspet, Erin K, PA-C  budesonide-formoterol (SYMBICORT ) 160-4.5 MCG/ACT inhaler Inhale 2 puffs into the lungs in the morning and at bedtime. 10/06/23   Assaker, Darrin, MD  buPROPion  (WELLBUTRIN  XL) 150 MG 24 hr tablet Take 1 tablet (150 mg total) by mouth daily. 05/24/24   Tanda Bleacher, MD  Continuous Glucose Sensor (DEXCOM G7 SENSOR) MISC Use to check blood glucose throughout the day. Changes sensors once every 10 days. 05/04/24   Thapa, [place], MD  doxycycline  (VIBRAMYCIN ) 50 MG capsule TAKE 1 CAPSULE BY MOUTH EVERY DAY 04/10/24   Paci, Karina M, MD  empagliflozin  (JARDIANCE ) 25 MG TABS tablet Take 1 tablet (25 mg total) by mouth daily before breakfast. 05/04/24   Thapa, [place], MD  ezetimibe  (ZETIA ) 10 MG tablet TAKE 1 TABLET BY MOUTH EVERY DAY 01/05/24   Newlin, Enobong, MD  gabapentin  (NEURONTIN ) 300  MG capsule TAKE 1 CAPSULE BY MOUTH THREE TIMES A DAY 08/14/22   Tanda Bleacher, MD  insulin  aspart (NOVOLOG  FLEXPEN) 100 UNIT/ML FlexPen Inject 5-10 Units into the skin 3 (three) times daily with meals. Plus sliding scale as instructed.  Maximum 40 units/day. 05/04/24   Thapa, [place], MD  insulin  glargine-yfgn (SEMGLEE ) 100 UNIT/ML Pen INJECT 64 UNITS INTO THE SKIN DAILY. 05/01/24   Thapa, [place], MD  Insulin  Pen Needle (BD PEN NEEDLE NANO 2ND GEN) 32G X 4 MM MISC Use to inject insulin  a total of 4 times daily. 12/13/23   Newlin, Enobong, MD  levothyroxine  (SYNTHROID ) 25 MCG tablet Take 1 tablet (25 mcg total) by mouth daily. 02/09/24   Thapa, [place], MD  losartan  (COZAAR )  100 MG tablet TAKE 1 TABLET BY MOUTH EVERY DAY 05/11/24   Tanda Bleacher, MD  metroNIDAZOLE  (METROCREAM ) 0.75 % cream Apply topically 2 (two) times daily. 09/09/23 09/08/24  Corey Rufus HERO, MD  omeprazole  (PRILOSEC) 20 MG capsule TAKE 1 CAPSULE BY MOUTH EVERY DAY 10/07/23   Tanda Bleacher, MD  sertraline  (ZOLOFT ) 100 MG tablet TAKE 1 TABLET BY MOUTH EVERY DAY 02/15/24   Tanda Bleacher, MD  Tiotropium Bromide Monohydrate  (SPIRIVA  RESPIMAT) 1.25 MCG/ACT AERS Inhale 2 puffs into the lungs daily. 01/07/24   Tamea Dedra CROME, MD  VENTOLIN  HFA 108 (90 Base) MCG/ACT inhaler Inhale 2 puffs into the lungs every 4 (four) hours as needed for wheezing or shortness of breath. 01/11/24   Tanda Bleacher, MD    Family History Family History  Problem Relation Age of Onset   Memory loss Mother    Breast cancer Mother 4   Stroke Father    Breast cancer Maternal Aunt 55   Breast cancer Paternal Grandmother 81 - 89   Colon cancer Neg Hx    Rectal cancer Neg Hx    Stomach cancer Neg Hx    Esophageal cancer Neg Hx     Social History Social History   Tobacco Use   Smoking status: Never    Passive exposure: Never   Smokeless tobacco: Never  Vaping Use   Vaping status: Never Used  Substance Use Topics   Alcohol use: Yes    Alcohol/week: 0.0 standard drinks of alcohol    Comment: occasional wine   Drug use: No     Allergies   Valsartan, Ozempic  (0.25 or 0.5 mg-dose) [semaglutide (0.25 or 0.5mg -dos)], Atorvastatin, Codeine, and Metformin and related   Review of Systems Review of Systems  Per HPI  Physical Exam Triage Vital Signs ED Triage Vitals  Encounter Vitals Group     BP 07/01/24 1343 (!) 148/83     Girls Systolic BP Percentile --      Girls Diastolic BP Percentile --      Boys Systolic BP Percentile --      Boys Diastolic BP Percentile --      Pulse Rate 07/01/24 1343 81     Resp 07/01/24 1343 17     Temp 07/01/24 1343 97.7 F (36.5 C)     Temp Source 07/01/24 1343 Oral     SpO2  07/01/24 1343 98 %     Weight --      Height --      Head Circumference --      Peak Flow --      Pain Score 07/01/24 1335 6     Pain Loc --      Pain Education --      Exclude from Growth Chart --  No data found.  Updated Vital Signs BP (!) 148/83 (BP Location: Left Arm)   Pulse 81   Temp 97.7 F (36.5 C) (Oral)   Resp 17   LMP 12/10/2015 (LMP Unknown)   SpO2 98%   Visual Acuity Right Eye Distance:   Left Eye Distance:   Bilateral Distance:    Right Eye Near:   Left Eye Near:    Bilateral Near:     Physical Exam Vitals and nursing note reviewed.  Constitutional:      Appearance: Normal appearance.  HENT:     Head: Normocephalic and atraumatic.     Right Ear: External ear normal.     Left Ear: External ear normal.     Mouth/Throat:     Mouth: Mucous membranes are moist.  Cardiovascular:     Rate and Rhythm: Normal rate.  Pulmonary:     Effort: Pulmonary effort is normal.  Musculoskeletal:        General: Normal range of motion.  Skin:    General: Skin is warm and dry.     Findings: No rash.  Neurological:     General: No focal deficit present.     Mental Status: She is alert and oriented to person, place, and time.  Psychiatric:        Mood and Affect: Mood normal.        Behavior: Behavior normal.      UC Treatments / Results  Labs (all labs ordered are listed, but only abnormal results are displayed) Labs Reviewed  POCT URINE DIPSTICK - Abnormal; Notable for the following components:      Result Value   Glucose, UA >=1,000 (*)    Ketones, POC UA large (80) (*)    All other components within normal limits  CBC WITH DIFFERENTIAL/PLATELET  COMPREHENSIVE METABOLIC PANEL WITH GFR    EKG   Radiology No results found.  Procedures Procedures (including critical care time)  Medications Ordered in UC Medications - No data to display  Initial Impression / Assessment and Plan / UC Course  I have reviewed the triage vital signs and the  nursing notes.  Pertinent labs & imaging results that were available during my care of the patient were reviewed by me and considered in my medical decision making (see chart for details).  Vitals and triage reviewed, patient is hemodynamically stable.  Right flank without any skin changes or rashes, mildly tender to palpation.  No overlying bruising.  UA does not show evidence of infection, does show glucose and large ketones, suspect from Jardiance  and potential mild dehydration.  CBC and CMP obtained and staff will contact if follow-up is needed based on results.  Plan of care, follow-up care return precautions given, no questions at this time.     Final Clinical Impressions(s) / UC Diagnoses   Final diagnoses:  Acute right flank pain     Discharge Instructions      Your urine did not show evidence of infection.  We are checking basic labs including kidney function today in clinic and we will contact you if urgent follow-up is needed based on results.  In the meantime you can heat and gently stretch the area.  Warm compresses may help as well.  For pain you can alternate between 600 mg of ibuprofen and 500 mg of Tylenol  every 4-6 hours.  Follow-up with your primary care provider if symptoms persist without improvement into next week.      ED Prescriptions   None  PDMP not reviewed this encounter.   Dreama Rachell SAILOR, FNP 07/01/24 1428

## 2024-07-01 NOTE — Discharge Instructions (Signed)
 Your urine did not show evidence of infection.  We are checking basic labs including kidney function today in clinic and we will contact you if urgent follow-up is needed based on results.  In the meantime you can heat and gently stretch the area.  Warm compresses may help as well.  For pain you can alternate between 600 mg of ibuprofen and 500 mg of Tylenol  every 4-6 hours.  Follow-up with your primary care provider if symptoms persist without improvement into next week.

## 2024-07-01 NOTE — ED Triage Notes (Signed)
 Pt c/o right side lower back pain that radiates to abdomin for 1 day. States it feels bruised denies urinary symptoms.

## 2024-07-02 ENCOUNTER — Ambulatory Visit

## 2024-07-02 LAB — COMPREHENSIVE METABOLIC PANEL WITH GFR
ALT: 13 IU/L (ref 0–32)
AST: 14 IU/L (ref 0–40)
Albumin: 4.4 g/dL (ref 3.8–4.9)
Alkaline Phosphatase: 103 IU/L (ref 44–121)
BUN/Creatinine Ratio: 22 (ref 9–23)
BUN: 19 mg/dL (ref 6–24)
Bilirubin Total: 0.2 mg/dL (ref 0.0–1.2)
CO2: 18 mmol/L — ABNORMAL LOW (ref 20–29)
Calcium: 9.6 mg/dL (ref 8.7–10.2)
Chloride: 101 mmol/L (ref 96–106)
Creatinine, Ser: 0.86 mg/dL (ref 0.57–1.00)
Globulin, Total: 2.9 g/dL (ref 1.5–4.5)
Glucose: 221 mg/dL — ABNORMAL HIGH (ref 70–99)
Potassium: 4.8 mmol/L (ref 3.5–5.2)
Sodium: 137 mmol/L (ref 134–144)
Total Protein: 7.3 g/dL (ref 6.0–8.5)
eGFR: 78 mL/min/1.73 (ref >59)

## 2024-07-02 LAB — CBC WITH DIFFERENTIAL/PLATELET
Basophils Absolute: 0 x10E3/uL (ref 0.0–0.2)
Basos: 0 %
EOS (ABSOLUTE): 0.6 x10E3/uL — ABNORMAL HIGH (ref 0.0–0.4)
Eos: 5 %
Hematocrit: 42.4 % (ref 34.0–46.6)
Hemoglobin: 13.5 g/dL (ref 11.1–15.9)
Immature Grans (Abs): 0 x10E3/uL (ref 0.0–0.1)
Immature Granulocytes: 0 %
Lymphocytes Absolute: 3.3 x10E3/uL — ABNORMAL HIGH (ref 0.7–3.1)
Lymphs: 27 %
MCH: 28.4 pg (ref 26.6–33.0)
MCHC: 31.8 g/dL (ref 31.5–35.7)
MCV: 89 fL (ref 79–97)
Monocytes Absolute: 0.5 x10E3/uL (ref 0.1–0.9)
Monocytes: 4 %
Neutrophils Absolute: 7.7 x10E3/uL — ABNORMAL HIGH (ref 1.4–7.0)
Neutrophils: 64 %
Platelets: 341 x10E3/uL (ref 150–450)
RBC: 4.75 x10E6/uL (ref 3.77–5.28)
RDW: 13.4 % (ref 11.7–15.4)
WBC: 12 x10E3/uL — ABNORMAL HIGH (ref 3.4–10.8)

## 2024-07-03 ENCOUNTER — Ambulatory Visit: Payer: Self-pay

## 2024-07-10 ENCOUNTER — Ambulatory Visit
Admission: EM | Admit: 2024-07-10 | Discharge: 2024-07-10 | Disposition: A | Discharge status: Home / Self Care | Attending: Emergency Medicine | Admitting: Emergency Medicine

## 2024-07-10 ENCOUNTER — Ambulatory Visit (INDEPENDENT_AMBULATORY_CARE_PROVIDER_SITE_OTHER)

## 2024-07-10 ENCOUNTER — Ambulatory Visit: Payer: Self-pay | Admitting: Emergency Medicine

## 2024-07-10 DIAGNOSIS — R109 Unspecified abdominal pain: Secondary | ICD-10-CM | POA: Diagnosis not present

## 2024-07-10 HISTORY — DX: Unspecified osteoarthritis, unspecified site: M19.90

## 2024-07-10 HISTORY — DX: Depression, unspecified: F32.A

## 2024-07-10 HISTORY — DX: Anxiety disorder, unspecified: F41.9

## 2024-07-10 LAB — POCT URINE DIPSTICK
Bilirubin, UA: NEGATIVE
Blood, UA: NEGATIVE
Glucose, UA: 500 mg/dL — AB
Leukocytes, UA: NEGATIVE
Nitrite, UA: NEGATIVE
POC PROTEIN,UA: NEGATIVE
Spec Grav, UA: 1.015 (ref 1.010–1.025)
Urobilinogen, UA: 0.2 U/dL
pH, UA: 5.5 (ref 5.0–8.0)

## 2024-07-10 MED ORDER — TAMSULOSIN HCL 0.4 MG PO CAPS: 0.4000 mg | ORAL_CAPSULE | Freq: Every day | ORAL | 0 refills | Status: AC

## 2024-07-10 NOTE — ED Provider Notes (Signed)
 HPI  SUBJECTIVE:  Jessica Ponce is a 58 y.o. female who presents with 9 days of constant, achy waxing and waning right back and flank pain, tenderness to palpitation.  She reports burning and itching pain.  There is no rash in the area of pain.  The back pain does not radiate down her leg.  It has not moved since this started.  She reports urinary urgency and frequency, but has increased her fluid intake over the past 9 days.  She is having normal bowel movements with no change in her pain after stooling.  No nausea, vomiting, fevers, dysuria, cloudy or odorous urine, hematuria.  No coughing, wheezing, shortness of breath.  No vaginal bleeding, odor, discharge.  She has been taking 1200 mg of Tylenol  once or twice a day with improvement in her symptoms.  Last dose was within 6 hours of evaluation.  Symptoms are worse with lying/sitting for prolonged periods of time.  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  No history of nephrolithiasis, shingles, hepatitis, gallbladder disease.  Family history negative for nephrolithiasis.  PCP: [facility].  Per chart review she was seen in another [facility] urgent care in 8/2 for back/flank pain.  Her UA was negative for UTI.  CBC, CMP sent, which showed some leukocytosis, but was similar to previous results in the past 9 months.   Past Medical History:  Diagnosis Date   Allergy    Anxiety 01-2019   Arthritis    Asthma    no current problems, does not use inhaler   Depression    Some wirg covid lock down   Diabetes mellitus without complication (HCC)    type 2   Essential hypertension    Hyperlipidemia    Stroke [facility])     Past Surgical History:  Procedure Laterality Date   APPENDECTOMY  1985   exploratory laparoscopy     WISDOM TOOTH EXTRACTION      Family History  Problem Relation Age of Onset    Memory loss Mother    Breast cancer Mother 32   Cancer Mother    Stroke Father    Breast cancer Maternal Aunt 63   Breast cancer Paternal Grandmother 110 - 89   Cancer Maternal Aunt    Colon cancer Neg Hx    Rectal cancer Neg Hx    Stomach cancer Neg Hx    Esophageal cancer Neg Hx     Social History   Tobacco Use   Smoking status: Never    Passive exposure: Never   Smokeless tobacco: Never  Vaping Use   Vaping status: Never Used  Substance Use Topics   Alcohol use: Yes    Alcohol/week: 0.0 standard drinks of alcohol    Comment: occasional wine   Drug use: No    No current facility-administered medications for this encounter.  Current Outpatient Medications:    dicyclomine (BENTYL) 20 MG tablet, Take 20 mg by mouth 4 (four) times daily., Disp: , Rfl:    famotidine (PEPCID) 40 MG tablet, Take 40 mg by mouth daily., Disp: , Rfl:    insulin  glargine (LANTUS  SOLOSTAR) 100 UNIT/ML Solostar Pen, See Admin Instructions. PLEASE SEE ATTACHED FOR DETAILED DIRECTIONS, Disp: , Rfl:    pantoprazole  (PROTONIX ) 40 MG tablet, Take 40 mg by mouth., Disp: , Rfl:    sucralfate (CARAFATE) 1 g tablet, Take 1 g by mouth 4 (  four) times daily., Disp: , Rfl:    tamsulosin  (FLOMAX ) 0.4 MG CAPS capsule, Take 1 capsule (0.4 mg total) by mouth at bedtime for 7 days., Disp: 7 capsule, Rfl: 0   albuterol  (PROVENTIL ) (2.5 MG/3ML) 0.083% nebulizer solution, Take 2.5 mg by nebulization every 4 (four) hours as needed for shortness of breath or wheezing., Disp: , Rfl:    amLODipine  (NORVASC ) 10 MG tablet, TAKE 1 TABLET BY MOUTH EVERY DAY, Disp: 90 tablet, Rfl: 1   aspirin  EC 81 MG tablet, Take 1 tablet (81 mg total) by mouth daily. Swallow whole., Disp: 30 tablet, Rfl: 12   baclofen  (LIORESAL ) 10 MG tablet, Take 1 tablet (10 mg total) by mouth 2 (two) times daily as needed for muscle spasms., Disp: 20 each, Rfl: 0   budesonide-formoterol (SYMBICORT ) 160-4.5 MCG/ACT inhaler, Inhale 2 puffs into the lungs in the  morning and at bedtime., Disp: 30.6 g, Rfl: 12   buPROPion  (WELLBUTRIN  XL) 150 MG 24 hr tablet, Take 1 tablet (150 mg total) by mouth daily., Disp: 90 tablet, Rfl: 0   Continuous Glucose Sensor (DEXCOM G7 SENSOR) MISC, Use to check blood glucose throughout the day. Changes sensors once every 10 days., Disp: 9 each, Rfl: 3   doxycycline  (VIBRAMYCIN ) 50 MG capsule, TAKE 1 CAPSULE BY MOUTH EVERY DAY, Disp: 30 capsule, Rfl: 5   empagliflozin  (JARDIANCE ) 25 MG TABS tablet, Take 1 tablet (25 mg total) by mouth daily before breakfast., Disp: 90 tablet, Rfl: 3   ezetimibe  (ZETIA ) 10 MG tablet, TAKE 1 TABLET BY MOUTH EVERY DAY, Disp: 90 tablet, Rfl: 1   gabapentin  (NEURONTIN ) 300 MG capsule, TAKE 1 CAPSULE BY MOUTH THREE TIMES A DAY, Disp: 84 capsule, Rfl: 1   insulin  aspart (NOVOLOG  FLEXPEN) 100 UNIT/ML FlexPen, Inject 5-10 Units into the skin 3 (three) times daily with meals. Plus sliding scale as instructed.  Maximum 40 units/day., Disp: 30 mL, Rfl: 3   insulin  glargine-yfgn (SEMGLEE ) 100 UNIT/ML Pen, INJECT 64 UNITS INTO THE SKIN DAILY., Disp: 30 mL, Rfl: 1   Insulin  Pen Needle (BD PEN NEEDLE NANO 2ND GEN) 32G X 4 MM MISC, Use to inject insulin  a total of 4 times daily., Disp: 400 each, Rfl: 2   levothyroxine  (SYNTHROID ) 25 MCG tablet, Take 1 tablet (25 mcg total) by mouth daily., Disp: 90 tablet, Rfl: 3   losartan  (COZAAR ) 100 MG tablet, TAKE 1 TABLET BY MOUTH EVERY DAY, Disp: 90 tablet, Rfl: 1   metroNIDAZOLE  (METROCREAM ) 0.75 % cream, Apply topically 2 (two) times daily., Disp: 45 g, Rfl: 3   omeprazole  (PRILOSEC) 20 MG capsule, TAKE 1 CAPSULE BY MOUTH EVERY DAY, Disp: 90 capsule, Rfl: 1   sertraline  (ZOLOFT ) 100 MG tablet, TAKE 1 TABLET BY MOUTH EVERY DAY, Disp: 90 tablet, Rfl: 1   Tiotropium Bromide Monohydrate  (SPIRIVA  RESPIMAT) 1.25 MCG/ACT AERS, Inhale 2 puffs into the lungs daily., Disp: 12 g, Rfl: 0   VENTOLIN  HFA 108 (90 Base) MCG/ACT inhaler, Inhale 2 puffs into the lungs every 4 (four) hours  as needed for wheezing or shortness of breath., Disp: 1 each, Rfl: 2  Allergies  Allergen Reactions   Valsartan Diarrhea   Ozempic  (0.25 Or 0.5 Mg-Dose) [Semaglutide (0.25 Or 0.5mg -Dos)] Nausea And Vomiting    Multiple episodes of severe vomiting on Ozempic    Atorvastatin Other (See Comments)    Elevated sugars   Codeine Nausea And Vomiting   Metformin And Related Diarrhea     ROS  As noted in HPI.   Physical Exam  BP (!) 149/82 (BP Location: Left Arm)   Pulse 73   Temp 97.7 F (36.5 C) (Oral)   Resp 18   Ht 5' 5 (1.651 m)   Wt 104.1 kg   LMP 12/10/2015 (LMP Unknown)   SpO2 96%   BMI 38.19 kg/m   Constitutional: Well developed, well nourished, no acute distress Eyes: PERRL, EOMI, conjunctiva normal bilaterally HENT: Normocephalic, atraumatic,mucus membranes moist Respiratory: Clear to auscultation bilaterally, no rales, no wheezing, no rhonchi Cardiovascular: Normal rate and rhythm, no murmurs, no gallops, no rubs GI: Soft, nondistended, normal bowel sounds,no rebound, no guarding.  Right flank tenderness.  No suprapubic tenderness.  Negative Murphy, negative McBurney. Back: Questionable mild right CVAT.  No L-spine tenderness.  No paralumbar tenderness. skin: No rash in the area of pain, skin intact Musculoskeletal: No edema, no tenderness, no deformities Neurologic: Alert & oriented x 3, CN III-XII grossly intact, no motor deficits, sensation grossly intact Psychiatric: Speech and behavior appropriate   ED Course   Medications - No data to display  Orders Placed This Encounter  Procedures   Urine Culture    Standing Status:   Standing    Number of Occurrences:   1    Indication:   Flank Pain   DG Abd 1 View    Standing Status:   Standing    Number of Occurrences:   1    Reason for Exam (SYMPTOM  OR DIAGNOSIS REQUIRED):   R flank pain x 9 days r/o stone   POCT URINE DIPSTICK    Standing Status:   Standing    Number of Occurrences:   1   Results for  orders placed or performed during the hospital encounter of 07/10/24 (from the past 24 hours)  POCT URINE DIPSTICK     Status: Abnormal   Collection Time: 07/10/24 10:38 AM  Result Value Ref Range   Color, UA light yellow (A) yellow   Clarity, UA clear clear   Glucose, UA =500 (A) negative mg/dL   Bilirubin, UA negative negative   Ketones, POC UA moderate (40) (A) negative mg/dL   Spec Grav, UA 8.984 8.989 - 1.025   Blood, UA negative negative   pH, UA 5.5 5.0 - 8.0   POC PROTEIN,UA negative negative, trace   Urobilinogen, UA 0.2 0.2 or 1.0 E.U./dL   Nitrite, UA Negative Negative   Leukocytes, UA Negative Negative   DG Abd 1 View Result Date: 07/10/2024 CLINICAL DATA:  Right flank pain EXAM: ABDOMEN - 1 VIEW COMPARISON:  None Available. FINDINGS: There is a non obstructive bowel gas pattern. No supine evidence of free air. No organomegaly or suspicious calcification. No acute bony abnormality. Moderate stool burden throughout the colon. IMPRESSION: Moderate stool burden. No visible suspicious calcifications. No acute findings. Electronically Signed   By: Franky Crease M.D.   On: 07/10/2024 13:55    ED Clinical Impression  1. Right flank pain      ED Assessment/Plan     Previous records, labs reviewed.  As noted in HPI.  UA reviewed.  Positive glucose, moderate ketones.  No nitrites, esterase, blood.  She had large ketones and glucose in her last UA, also negative for blood, nitrite, esterase.  Labs done 9 days ago show normal liver function, doubt hepatitis.  No evidence of cholecystitis on today's exam.  Her glucose was elevated, but she states that has been elevated over the past week while her daughter has been in the hospital and she has been eating  different foods.  She had mild leukocytosis, but it is at her baseline.  There is no evidence of shingles.  I wonder if she has nephrolithiasis.  Will check KUB.  Discussed with patient that not often show up plain films.  Will send  urine off for culture confirm absence of UTI.  Will base further treatment off of lab results.  Reviewed imaging independently.  No radiopaque nephrolithiasis.  Formal radiology overread pending.  Discussed this with patient.  Will contact patient at [phone] if radiology overread differs enough mine and we need to change management.  Radiology report reviewed.  No radiopaque stone, nonobstructive bowel gas pattern, no free air.  Moderate stool burden.  See report for details.  Sent patient MyChart note advising her findings and to start Dulcolax and MiraLAX as well as plan outlined below.  Will have patient continue Tylenol  1000 mg 3-4 times a day as needed for pain.  Will try Flomax  in case she has a stone not showing up on x-ray.  Doubt bowel obstruction.  She had a normal bowel movement this morning with no change in her pain.  She is to go to the ED if not better in a day or 2, sooner if she gets worse.  She is not allowed NSAIDs as this is what caused her PUD.  Discussed labs, imaging, MDM, treatment plan, and plan for follow-up with patient Discussed sn/sx that should prompt return to the ED. patient agrees with plan.   Meds ordered this encounter  Medications   tamsulosin  (FLOMAX ) 0.4 MG CAPS capsule    Sig: Take 1 capsule (0.4 mg total) by mouth at bedtime for 7 days.    Dispense:  7 capsule    Refill:  0      *This clinic note was created using Scientist, clinical (histocompatibility and immunogenetics). Therefore, there may be occasional mistakes despite careful proofreading. ?    Van Knee, MD 07/11/24 1718

## 2024-07-10 NOTE — ED Triage Notes (Signed)
 I am having back/kidney pain. A week from this past Saturday, I went your [facility] for right lower back pain, they took urine and all that but I never heard from anything so I assume it was all negative. This pain never went away and seem's to be getting worse. The pain is sensitive to touch. No visible rash. No dysuria. Stools hard/loose at times, dealing with Ulcer.

## 2024-07-10 NOTE — Discharge Instructions (Signed)
 Will contact you if the radiology overread differs enough from mine.  I do not appreciate any stone on your x-ray, but not all stones drop in x-ray.  I am going to try and treat as if this is 1 with Flomax .  Make sure you continue drinking lots of fluids.  We have sent your urine off for culture to make sure you do not have a urinary tract infection.  We will contact you if you do have a UTI and start you on the appropriate antibiotics.  1000 mg Tylenol  3-4 times a day in addition to the Flomax .  Go to the ER if you are not better in 24 to 48 hours, sooner if you get worse

## 2024-07-11 LAB — URINE CULTURE: Culture: NO GROWTH

## 2024-07-12 ENCOUNTER — Emergency Department (HOSPITAL_COMMUNITY)

## 2024-07-12 ENCOUNTER — Emergency Department (HOSPITAL_COMMUNITY)
Admission: EM | Admit: 2024-07-12 | Discharge: 2024-07-12 | Disposition: A | Discharge status: Home / Self Care | Attending: Emergency Medicine | Admitting: Emergency Medicine

## 2024-07-12 ENCOUNTER — Other Ambulatory Visit: Payer: Self-pay

## 2024-07-12 DIAGNOSIS — Z794 Long term (current) use of insulin: Secondary | ICD-10-CM | POA: Diagnosis not present

## 2024-07-12 DIAGNOSIS — Z7982 Long term (current) use of aspirin: Secondary | ICD-10-CM | POA: Diagnosis not present

## 2024-07-12 DIAGNOSIS — R109 Unspecified abdominal pain: Secondary | ICD-10-CM

## 2024-07-12 LAB — COMPREHENSIVE METABOLIC PANEL WITH GFR
ALT: 17 U/L (ref 0–44)
AST: 14 U/L — ABNORMAL LOW (ref 15–41)
Albumin: 4.1 g/dL (ref 3.5–5.0)
Alkaline Phosphatase: 84 U/L (ref 38–126)
Anion gap: 12 (ref 5–15)
BUN: 19 mg/dL (ref 6–20)
CO2: 19 mmol/L — ABNORMAL LOW (ref 22–32)
Calcium: 9.4 mg/dL (ref 8.9–10.3)
Chloride: 106 mmol/L (ref 98–111)
Creatinine, Ser: 0.85 mg/dL (ref 0.44–1.00)
GFR, Estimated: >60 mL/min (ref >60)
Glucose, Bld: 240 mg/dL — ABNORMAL HIGH (ref 70–99)
Potassium: 3.7 mmol/L (ref 3.5–5.1)
Sodium: 137 mmol/L (ref 135–145)
Total Bilirubin: 0.8 mg/dL (ref 0.0–1.2)
Total Protein: 7.2 g/dL (ref 6.5–8.1)

## 2024-07-12 LAB — URINALYSIS, ROUTINE W REFLEX MICROSCOPIC
Bacteria, UA: NONE SEEN
Bilirubin Urine: NEGATIVE
Glucose, UA: >=500 mg/dL — AB
Hgb urine dipstick: NEGATIVE
Ketones, ur: 20 mg/dL — AB
Leukocytes,Ua: NEGATIVE
Nitrite: NEGATIVE
Protein, ur: NEGATIVE mg/dL
Specific Gravity, Urine: 1.032 — ABNORMAL HIGH (ref 1.005–1.030)
pH: 5 (ref 5.0–8.0)

## 2024-07-12 LAB — CBC
HCT: 41.2 % (ref 36.0–46.0)
Hemoglobin: 13.7 g/dL (ref 12.0–15.0)
MCH: 28.6 pg (ref 26.0–34.0)
MCHC: 33.3 g/dL (ref 30.0–36.0)
MCV: 86 fL (ref 80.0–100.0)
Platelets: 324 K/uL (ref 150–400)
RBC: 4.79 MIL/uL (ref 3.87–5.11)
RDW: 13.6 % (ref 11.5–15.5)
WBC: 10.4 K/uL (ref 4.0–10.5)
nRBC: 0 % (ref 0.0–0.2)

## 2024-07-12 LAB — LIPASE, BLOOD: Lipase: 34 U/L (ref 11–51)

## 2024-07-12 MED ORDER — LIDOCAINE 5 % EX PTCH
1.0000 | MEDICATED_PATCH | Freq: Once | CUTANEOUS | Status: DC
  Administered 2024-07-12 (×2): 1 via TRANSDERMAL
  Filled 2024-07-12: qty 1

## 2024-07-12 MED ORDER — VALACYCLOVIR HCL 500 MG PO TABS
1000.0000 mg | ORAL_TABLET | Freq: Once | ORAL | Status: AC
  Administered 2024-07-12 (×2): 1000 mg via ORAL
  Filled 2024-07-12: qty 2

## 2024-07-12 MED ORDER — VALACYCLOVIR HCL 1 G PO TABS: 1000.0000 mg | ORAL_TABLET | Freq: Three times a day (TID) | ORAL | 0 refills | Status: DC

## 2024-07-12 MED ORDER — PREDNISONE 20 MG PO TABS
60.0000 mg | ORAL_TABLET | ORAL | Status: AC
  Administered 2024-07-12 (×2): 60 mg via ORAL
  Filled 2024-07-12: qty 3

## 2024-07-12 MED ORDER — PREDNISONE 20 MG PO TABS
40.0000 mg | ORAL_TABLET | Freq: Every day | ORAL | 0 refills | Status: AC
Start: 2024-07-12 — End: 2024-07-18

## 2024-07-12 MED ORDER — KETOROLAC TROMETHAMINE 15 MG/ML IJ SOLN
15.0000 mg | Freq: Once | INTRAMUSCULAR | Status: AC
  Administered 2024-07-12 (×2): 15 mg via INTRAMUSCULAR
  Filled 2024-07-12: qty 1

## 2024-07-12 NOTE — ED Provider Triage Note (Signed)
 Emergency Medicine Provider Triage Evaluation Note  Jessica Ponce , a 58 y.o. female  was evaluated in triage.  Pt complains of right flank pain with waxing waning symptoms for the past week or so.  No reported trauma.  Some frequency, but no dysuria.  No nausea or vomiting.  Pain is constant.  Right flank, wraps around to right lower quadrant  Review of Systems  Positive: Flank pain Negative: Nausea vomiting diarrhea  Physical Exam  BP (!) 152/82 (BP Location: Right Arm)   Pulse 87   Temp 98.3 F (36.8 C)   Resp 17   LMP 12/10/2015 (LMP Unknown)   SpO2 96%  Gen:   Awake, no distress   Resp:  Normal effort  MSK:   Moves extremities without difficulty  Other:  Benign abd exam, but does have some tenderness to the lower abdomen. Tender to right flank and low back.  No rash  Medical Decision Making  Medically screening exam initiated at 9:11 AM.  Appropriate orders placed.  Iridessa Harrow was informed that the remainder of the evaluation will be completed by another provider, this initial triage assessment does not replace that evaluation, and the importance of remaining in the ED until their evaluation is complete.  In the emergency department for right flank pain.  Seen in urgent care and treated for suspected stone.  Pain reproducible with palpation which point towards MSK etiology of low back.  However, she is quite concerned for stone.  Will get labs and ct scan.    Neysa Caron PARAS, DO 07/12/24 [phone]

## 2024-07-12 NOTE — ED Provider Notes (Signed)
 [place] EMERGENCY DEPARTMENT AT [facility] Provider Note   CSN: [id number] Arrival date & time: 07/12/24  0848     Patient presents with: Flank Pain   Jessica Ponce is a 58 y.o. female.   HPI Patient presents with ongoing right flank pain.  Pain has been present intermittently for weeks, worse over the past days, after prior exacerbation prompted urgent care evaluation.  No dysuria, hematuria, nausea, vomiting, diarrhea pain is focally in the right superior hip region, radiating both anterior and posteriorly in a roughly radicular pattern.     Prior to Admission medications   Medication Sig Start Date End Date Taking? Authorizing Provider  predniSONE  (DELTASONE ) 20 MG tablet Take 2 tablets (40 mg total) by mouth daily with breakfast for 6 days. For the next four days 07/12/24 07/18/24 Yes Garrick Charleston, MD  valACYclovir  (VALTREX ) 1000 MG tablet Take 1 tablet (1,000 mg total) by mouth 3 (three) times daily. 07/12/24  Yes Garrick Charleston, MD  albuterol  (PROVENTIL ) (2.5 MG/3ML) 0.083% nebulizer solution Take 2.5 mg by nebulization every 4 (four) hours as needed for shortness of breath or wheezing.    [provider]  amLODipine  (NORVASC ) 10 MG tablet TAKE 1 TABLET BY MOUTH EVERY DAY 05/15/24   Tanda Bleacher, MD  aspirin  EC 81 MG tablet Take 1 tablet (81 mg total) by mouth daily. Swallow whole. 05/30/22   Remi Pippin, NP  baclofen  (LIORESAL ) 10 MG tablet Take 1 tablet (10 mg total) by mouth 2 (two) times daily as needed for muscle spasms. 06/09/24   Raspet, Erin K, PA-C  budesonide-formoterol (SYMBICORT ) 160-4.5 MCG/ACT inhaler Inhale 2 puffs into the lungs in the morning and at bedtime. 10/06/23   Assaker, Darrin, MD  buPROPion  (WELLBUTRIN  XL) 150 MG 24 hr tablet Take 1 tablet (150 mg total) by mouth daily. 05/24/24   Tanda Bleacher, MD  Continuous Glucose Sensor (DEXCOM G7 SENSOR) MISC Use to check blood glucose throughout the day. Changes sensors once every 10  days. 05/04/24   Thapa, [place], MD  dicyclomine (BENTYL) 20 MG tablet Take 20 mg by mouth 4 (four) times daily. 04/06/24   [provider]  doxycycline  (VIBRAMYCIN ) 50 MG capsule TAKE 1 CAPSULE BY MOUTH EVERY DAY 04/10/24   Paci, Karina M, MD  empagliflozin  (JARDIANCE ) 25 MG TABS tablet Take 1 tablet (25 mg total) by mouth daily before breakfast. 05/04/24   Thapa, [place], MD  ezetimibe  (ZETIA ) 10 MG tablet TAKE 1 TABLET BY MOUTH EVERY DAY 01/05/24   Newlin, Enobong, MD  famotidine (PEPCID) 40 MG tablet Take 40 mg by mouth daily. 06/30/24 06/30/25  [provider]  gabapentin  (NEURONTIN ) 300 MG capsule TAKE 1 CAPSULE BY MOUTH THREE TIMES A DAY 08/14/22   Tanda Bleacher, MD  insulin  aspart (NOVOLOG  FLEXPEN) 100 UNIT/ML FlexPen Inject 5-10 Units into the skin 3 (three) times daily with meals. Plus sliding scale as instructed.  Maximum 40 units/day. 05/04/24   Thapa, [place], MD  insulin  glargine (LANTUS  SOLOSTAR) 100 UNIT/ML Solostar Pen See Admin Instructions. PLEASE SEE ATTACHED FOR DETAILED DIRECTIONS 03/16/24   [provider]  insulin  glargine-yfgn (SEMGLEE ) 100 UNIT/ML Pen INJECT 64 UNITS INTO THE SKIN DAILY. 05/01/24   Thapa, [place], MD  Insulin  Pen Needle (BD PEN NEEDLE NANO 2ND GEN) 32G X 4 MM MISC Use to inject insulin  a total of 4 times daily. 12/13/23   Newlin, Enobong, MD  levothyroxine  (SYNTHROID ) 25 MCG tablet Take 1 tablet (25 mcg total) by mouth daily. 02/09/24  Thapa, [place], MD  losartan  (COZAAR ) 100 MG tablet TAKE 1 TABLET BY MOUTH EVERY DAY 05/11/24   Tanda Bleacher, MD  metroNIDAZOLE  (METROCREAM ) 0.75 % cream Apply topically 2 (two) times daily. 09/09/23 09/08/24  Paci, Karina M, MD  omeprazole  (PRILOSEC) 20 MG capsule TAKE 1 CAPSULE BY MOUTH EVERY DAY 10/07/23   Tanda Bleacher, MD  pantoprazole  (PROTONIX ) 40 MG tablet Take 40 mg by mouth. 06/13/24 06/13/25  [provider]  sertraline  (ZOLOFT ) 100 MG tablet TAKE 1 TABLET BY MOUTH EVERY DAY 02/15/24   Tanda Bleacher, MD   sucralfate (CARAFATE) 1 g tablet Take 1 g by mouth 4 (four) times daily. 04/06/24   [provider]  tamsulosin  (FLOMAX ) 0.4 MG CAPS capsule Take 1 capsule (0.4 mg total) by mouth at bedtime for 7 days. 07/10/24 07/17/24  Mortenson, Ashley, MD  Tiotropium Bromide Monohydrate  (SPIRIVA  RESPIMAT) 1.25 MCG/ACT AERS Inhale 2 puffs into the lungs daily. 01/07/24   Tamea Dedra CROME, MD  VENTOLIN  HFA 108 [phone] Base) MCG/ACT inhaler Inhale 2 puffs into the lungs every 4 (four) hours as needed for wheezing or shortness of breath. 01/11/24   Tanda Bleacher, MD    Allergies: Valsartan, Ozempic  (0.25 or 0.5 mg-dose) [semaglutide (0.25 or 0.5mg -dos)], Atorvastatin, Codeine, and Metformin and related    Review of Systems  Updated Vital Signs BP 128/70   Pulse 86   Temp 97.7 F (36.5 C) (Oral)   Resp 14   LMP 12/10/2015 (LMP Unknown)   SpO2 98%   Physical Exam Vitals and nursing note reviewed.  Constitutional:      General: She is not in acute distress.    Appearance: She is well-developed.  HENT:     Head: Normocephalic and atraumatic.  Eyes:     Conjunctiva/sclera: Conjunctivae normal.  Cardiovascular:     Rate and Rhythm: Normal rate and regular rhythm.  Pulmonary:     Effort: Pulmonary effort is normal. No respiratory distress.     Breath sounds: Normal breath sounds. No stridor.  Abdominal:     General: There is no distension.  Skin:    General: Skin is warm and dry.  Neurological:     Mental Status: She is alert and oriented to person, place, and time.     Cranial Nerves: No cranial nerve deficit.  Psychiatric:        Mood and Affect: Mood normal.     (all labs ordered are listed, but only abnormal results are displayed) Labs Reviewed  COMPREHENSIVE METABOLIC PANEL WITH GFR - Abnormal; Notable for the following components:      Result Value   CO2 19 (*)    Glucose, Bld 240 (*)    AST 14 (*)    All other components within normal limits  URINALYSIS, ROUTINE W REFLEX  MICROSCOPIC - Abnormal; Notable for the following components:   Specific Gravity, Urine 1.032 (*)    Glucose, UA >=500 (*)    Ketones, ur 20 (*)    All other components within normal limits  CBC  LIPASE, BLOOD    EKG: None  Radiology: CT Renal Stone Study Result Date: 07/12/2024 CLINICAL DATA:  Right flank pain. EXAM: CT ABDOMEN AND PELVIS WITHOUT CONTRAST TECHNIQUE: Multidetector CT imaging of the abdomen and pelvis was performed following the standard protocol without IV contrast. RADIATION DOSE REDUCTION: This exam was performed according to the departmental dose-optimization program which includes automated exposure control, adjustment of the mA and/or kV according to patient size and/or use of iterative reconstruction technique.  COMPARISON:  None Available. FINDINGS: Lower chest: No acute abnormality. Hepatobiliary: No suspicious focal hepatic lesion identified within the limits of an unenhanced exam. Gallbladder is unremarkable. No biliary dilatation. Pancreas: Unremarkable. No pancreatic ductal dilatation or surrounding inflammatory changes. Spleen: Normal in size without focal abnormality. Adrenals/Urinary Tract: Adrenal glands are unremarkable. Kidneys are normal, without renal calculi, suspicious focal lesion, or hydronephrosis. Bladder is unremarkable. Stomach/Bowel: Stomach is within normal limits. Appendix is surgically absent. No evidence of bowel wall thickening, distention, or inflammatory changes. A 1.4 x 0.9 calcified mesenteric node in the right hemipelvis, adjacent to a loop of traversing small-bowel (series 3, image 63). Vascular/Lymphatic: Abdominal aorta is normal in caliber with mild atherosclerotic calcification. No pathologically enlarged abdominal or pelvic lymph nodes. Reproductive: Uterus and bilateral adnexa are unremarkable. Other: Small fat containing umbilical hernia. No abdominopelvic ascites. No intraperitoneal free air. Musculoskeletal: No acute or significant  osseous findings. IMPRESSION: 1. No acute localizing findings in the abdomen or pelvis. No urolithiasis or hydronephrosis. 2. 1.4 x 0.9 calcified mesenteric node in the right hemipelvis, adjacent to a loop of traversing small-bowel, is nonspecific and may relate to sequela of prior infection/inflammation. Carcinoid tumor can not be excluded. 3.  Aortic Atherosclerosis (ICD10-I70.0). Electronically Signed   By: Harrietta Sherry M.D.   On: 07/12/2024 10:34     Procedures   Medications Ordered in the ED  lidocaine  (LIDODERM ) 5 % 1 patch (1 patch Transdermal Patch Applied 07/12/24 1434)  ketorolac  (TORADOL ) 15 MG/ML injection 15 mg (has no administration in time range)  predniSONE  (DELTASONE ) tablet 60 mg (has no administration in time range)  valACYclovir  (VALTREX ) tablet 1,000 mg (has no administration in time range)    Clinical Course as of 07/12/24 1511  Wed Jul 12, 2024  1307 WBC, UA: 0-5 [DR]    Clinical Course User Index [DR] Levander Houston, MD                                 Medical Decision Making Patient with hip pain, broad differential including musculoskeletal, stone, kidney infection, mass, atypical shingles.  Amount and/or Complexity of Data Reviewed External Data Reviewed: notes.    Details: Urgent care Labs:  Decision-making details documented in ED Course. Radiology: independent interpretation performed. Decision-making details documented in ED Course.  Risk Prescription drug management. Decision regarding hospitalization. Diagnosis or treatment significantly limited by social determinants of health.   3:11 PM Patient in no distress, awake, alert, vitals reviewed, labs reviewed, CT without evidence for stone, or acute findings beyond 1 cm node, that may require additional evaluation.  Patient has outpatient follow-up scheduled in 1 week, with reassuring findings, some consideration for atypical shingles given persistency of radicular pattern pain, patient will start  appropriate meds, discharged in stable condition. Final diagnoses:  Right flank pain    ED Discharge Orders          Ordered    predniSONE  (DELTASONE ) 20 MG tablet  Daily with breakfast        07/12/24 1510    valACYclovir  (VALTREX ) 1000 MG tablet  3 times daily        07/12/24 1510               Garrick Charleston, MD 07/12/24 1511

## 2024-07-12 NOTE — Group Note (Deleted)
 Date:  07/12/2024 Time:  2:22 PM  Group Topic/Focus:  Wellness Toolbox:   The focus of this group is to discuss various aspects of wellness, balancing those aspects and exploring ways to increase the ability to experience wellness.  Patients will create a wellness toolbox for use upon discharge.     Participation Level:  {BHH PARTICIPATION OZCZO:77735}  Participation Quality:  {BHH PARTICIPATION QUALITY:22265}  Affect:  {BHH AFFECT:22266}  Cognitive:  {BHH COGNITIVE:22267}  Insight: {BHH Insight2:20797}  Engagement in Group:  {BHH ENGAGEMENT IN HMNLE:77731}  Modes of Intervention:  {BHH MODES OF INTERVENTION:22269}  Additional Comments:  ***  Jessica Ponce 07/12/2024, 2:22 PM

## 2024-07-12 NOTE — ED Triage Notes (Signed)
 Pt. Stated, Ive had right flank pain since aug. 1 . I've been to 2 UC and no UTI so they suspect kidney stones.

## 2024-07-12 NOTE — Discharge Instructions (Addendum)
 Today's evaluation has been reassuring, do not hesitate to return here, otherwise be sure to follow-up with your physician in 1 week.  Discuss today's evaluation and today's CT scan.

## 2024-07-18 ENCOUNTER — Ambulatory Visit: Admitting: Family Medicine

## 2024-07-19 ENCOUNTER — Other Ambulatory Visit: Payer: Self-pay | Admitting: Family Medicine

## 2024-07-19 ENCOUNTER — Ambulatory Visit: Admitting: Family Medicine

## 2024-07-19 ENCOUNTER — Encounter: Payer: Self-pay | Admitting: Family Medicine

## 2024-07-19 VITALS — BP 154/84 | HR 82 | Ht 65.0 in | Wt 225.2 lb

## 2024-07-19 DIAGNOSIS — R251 Tremor, unspecified: Secondary | ICD-10-CM | POA: Diagnosis not present

## 2024-07-19 DIAGNOSIS — E66812 Obesity, class 2: Secondary | ICD-10-CM

## 2024-07-19 DIAGNOSIS — Z6837 Body mass index (BMI) 37.0-37.9, adult: Secondary | ICD-10-CM

## 2024-07-19 DIAGNOSIS — E1169 Type 2 diabetes mellitus with other specified complication: Secondary | ICD-10-CM

## 2024-07-19 DIAGNOSIS — I10 Essential (primary) hypertension: Secondary | ICD-10-CM

## 2024-07-19 DIAGNOSIS — Z794 Long term (current) use of insulin: Secondary | ICD-10-CM | POA: Diagnosis not present

## 2024-07-19 DIAGNOSIS — I898 Other specified noninfective disorders of lymphatic vessels and lymph nodes: Secondary | ICD-10-CM | POA: Diagnosis not present

## 2024-07-19 LAB — POCT GLYCOSYLATED HEMOGLOBIN (HGB A1C): HbA1c, POC (controlled diabetic range): 8.7 % — AB (ref 0.0–7.0)

## 2024-07-19 NOTE — Progress Notes (Unsigned)
 Established Patient Office Visit  Subjective    Patient ID: Jessica Ponce, female    DOB: February 28, 1966  Age: 58 y.o. MRN: [id number]  CC:  Chief Complaint  Patient presents with   Medical Management of Chronic Issues    HPI Jessica Ponce presents with complaint of right sided side pain for about 2 weeks. She was seen at [facility]. She had a CT which noted a calcified node that carcinoid could not be ruled out.   Outpatient Encounter Medications as of 07/19/2024  Medication Sig   albuterol  (PROVENTIL ) (2.5 MG/3ML) 0.083% nebulizer solution Take 2.5 mg by nebulization every 4 (four) hours as needed for shortness of breath or wheezing.   amLODipine  (NORVASC ) 10 MG tablet TAKE 1 TABLET BY MOUTH EVERY DAY   aspirin  EC 81 MG tablet Take 1 tablet (81 mg total) by mouth daily. Swallow whole.   baclofen  (LIORESAL ) 10 MG tablet Take 1 tablet (10 mg total) by mouth 2 (two) times daily as needed for muscle spasms.   budesonide-formoterol (SYMBICORT ) 160-4.5 MCG/ACT inhaler Inhale 2 puffs into the lungs in the morning and at bedtime.   buPROPion  (WELLBUTRIN  XL) 150 MG 24 hr tablet Take 1 tablet (150 mg total) by mouth daily.   Continuous Glucose Sensor (DEXCOM G7 SENSOR) MISC Use to check blood glucose throughout the day. Changes sensors once every 10 days.   dicyclomine (BENTYL) 20 MG tablet Take 20 mg by mouth 4 (four) times daily.   doxycycline  (VIBRAMYCIN ) 50 MG capsule TAKE 1 CAPSULE BY MOUTH EVERY DAY   empagliflozin  (JARDIANCE ) 25 MG TABS tablet Take 1 tablet (25 mg total) by mouth daily before breakfast.   ezetimibe  (ZETIA ) 10 MG tablet TAKE 1 TABLET BY MOUTH EVERY DAY   famotidine (PEPCID) 40 MG tablet Take 40 mg by mouth daily.   gabapentin  (NEURONTIN ) 300 MG capsule TAKE 1 CAPSULE BY MOUTH THREE TIMES A DAY   insulin  aspart (NOVOLOG  FLEXPEN) 100 UNIT/ML FlexPen Inject 5-10 Units into the skin 3 (three) times daily with meals. Plus sliding scale as instructed.  Maximum 40 units/day.   insulin   glargine (LANTUS  SOLOSTAR) 100 UNIT/ML Solostar Pen See Admin Instructions. PLEASE SEE ATTACHED FOR DETAILED DIRECTIONS   insulin  glargine-yfgn (SEMGLEE ) 100 UNIT/ML Pen INJECT 64 UNITS INTO THE SKIN DAILY.   Insulin  Pen Needle (BD PEN NEEDLE NANO 2ND GEN) 32G X 4 MM MISC Use to inject insulin  a total of 4 times daily.   levothyroxine  (SYNTHROID ) 25 MCG tablet Take 1 tablet (25 mcg total) by mouth daily.   losartan  (COZAAR ) 100 MG tablet TAKE 1 TABLET BY MOUTH EVERY DAY   metroNIDAZOLE  (METROCREAM ) 0.75 % cream Apply topically 2 (two) times daily.   omeprazole  (PRILOSEC) 20 MG capsule TAKE 1 CAPSULE BY MOUTH EVERY DAY   pantoprazole  (PROTONIX ) 40 MG tablet Take 40 mg by mouth.   sertraline  (ZOLOFT ) 100 MG tablet TAKE 1 TABLET BY MOUTH EVERY DAY   sucralfate (CARAFATE) 1 g tablet Take 1 g by mouth 4 (four) times daily.   Tiotropium Bromide Monohydrate  (SPIRIVA  RESPIMAT) 1.25 MCG/ACT AERS Inhale 2 puffs into the lungs daily.   valACYclovir  (VALTREX ) 1000 MG tablet Take 1 tablet (1,000 mg total) by mouth 3 (three) times daily.   VENTOLIN  HFA 108 (90 Base) MCG/ACT inhaler Inhale 2 puffs into the lungs every 4 (four) hours as needed for wheezing or shortness of breath.   No facility-administered encounter medications on file as of 07/19/2024.    Past Medical History:  Diagnosis Date  Allergy    Anxiety 01-2019   Arthritis    Asthma    no current problems, does not use inhaler   Depression    Some wirg covid lock down   Diabetes mellitus without complication (HCC)    type 2   Essential hypertension    Hyperlipidemia    Stroke [facility])     Past Surgical History:  Procedure Laterality Date   APPENDECTOMY  1985   exploratory laparoscopy     WISDOM TOOTH EXTRACTION      Family History  Problem Relation Age of Onset   Memory loss Mother    Breast cancer Mother 35   Cancer Mother    Stroke Father    Breast cancer Maternal Aunt 70   Breast cancer Paternal Grandmother 12 - 89   Cancer  Maternal Aunt    Colon cancer Neg Hx    Rectal cancer Neg Hx    Stomach cancer Neg Hx    Esophageal cancer Neg Hx     Social History   Socioeconomic History   Marital status: Married    Spouse name: Ozell   Number of children: 2   Years of education: 16   Highest education level: Bachelor's degree (e.g., BA, AB, BS)  Occupational History   Occupation: Self Employed  Tobacco Use   Smoking status: Never    Passive exposure: Never   Smokeless tobacco: Never  Vaping Use   Vaping status: Never Used  Substance and Sexual Activity   Alcohol use: Yes    Alcohol/week: 0.0 standard drinks of alcohol    Comment: occasional wine   Drug use: No   Sexual activity: Yes    Birth control/protection: Post-menopausal  Other Topics Concern   Not on file  Social History Narrative   Lives with husband and 2 kids   Caffeine use: Drinks 2-3 cups coffee per day   Social Drivers of Health   Financial Resource Strain: Medium Risk (07/17/2024)   Overall Financial Resource Strain (CARDIA)    Difficulty of Paying Living Expenses: Somewhat hard  Food Insecurity: Food Insecurity Present (07/17/2024)   Hunger Vital Sign    Worried About Running Out of Food in the Last Year: Sometimes true    Ran Out of Food in the Last Year: Never true  Transportation Needs: No Transportation Needs (07/17/2024)   PRAPARE - Administrator, Civil Service (Medical): No    Lack of Transportation (Non-Medical): No  Physical Activity: Inactive (07/17/2024)   Exercise Vital Sign    Days of Exercise per Week: 0 days    Minutes of Exercise per Session: Not on file  Stress: Stress Concern Present (07/17/2024)   [employer] of Occupational Health - Occupational Stress Questionnaire    Feeling of Stress: Rather much  Social Connections: Moderately Isolated (07/17/2024)   Social Connection and Isolation Panel    Frequency of Communication with Friends and Family: Once a week    Frequency of Social  Gatherings with Friends and Family: Once a week    Attends Religious Services: 1 to 4 times per year    Active Member of [employer] or Organizations: No    Attends Engineer, structural: Not on file    Marital Status: Married  Catering manager Violence: Not on file    Review of Systems  All other systems reviewed and are negative.       Objective    BP (!) 154/84   Pulse 82   Ht 5'  5 (1.651 m)   Wt 225 lb 3.2 oz (102.2 kg)   LMP 12/10/2015 (LMP Unknown)   SpO2 94%   BMI 37.48 kg/m   Physical Exam Vitals and nursing note reviewed.  Constitutional:      General: She is not in acute distress.    Appearance: She is obese.  Cardiovascular:     Rate and Rhythm: Normal rate and regular rhythm.  Pulmonary:     Effort: Pulmonary effort is normal.     Breath sounds: Normal breath sounds.  Abdominal:     Palpations: Abdomen is soft.     Tenderness: There is no abdominal tenderness.  Neurological:     General: No focal deficit present.     Mental Status: She is alert and oriented to person, place, and time.         Assessment & Plan:   Type 2 diabetes mellitus with other specified complication, with long-term current use of insulin  (HCC) -     POCT glycosylated hemoglobin (Hb A1C)  Essential hypertension  Calcified lymph nodes -     CT FNA BIOPSY 1ST LESION; Future  Tremor  Class 2 severe obesity due to excess calories with serious comorbidity and body mass index (BMI) of 37.0 to 37.9 in adult [facility])     No follow-ups on file.   Tanda Raguel SQUIBB, MD

## 2024-07-19 NOTE — Telephone Encounter (Signed)
 Copied from CRM #[id number]. Topic: Clinical - Medication Refill >> Jul 19, 2024  4:59 PM Dedra B wrote: Medication: sertraline  (ZOLOFT ) 100 MG tablet  Has the patient contacted their pharmacy? Rosaline from [facility] pharmacy called and said they have been sending over requests  This is the patient's preferred pharmacy:  [facility] - [place] - [address] [place] [address] Phone: [phone] Fax: [phone]  Is this the correct pharmacy for this prescription? Yes  Has the prescription been filled recently? No  Is the patient out of the medication? Unsure since pharmacy called  Has the patient been seen for an appointment in the last year OR does the patient have an upcoming appointment? Yes  Can we respond through MyChart? Yes  Agent: Please be advised that Rx refills may take up to 3 business days. We ask that you follow-up with your pharmacy.

## 2024-07-20 ENCOUNTER — Encounter: Payer: Self-pay | Admitting: Family Medicine

## 2024-07-20 NOTE — Telephone Encounter (Signed)
 Requested Prescriptions  Refused Prescriptions Disp Refills   sertraline  (ZOLOFT ) 100 MG tablet 90 tablet 1    Sig: Take 1 tablet (100 mg total) by mouth daily.     Psychiatry:  Antidepressants - SSRI - sertraline  Failed - 07/20/2024  4:17 PM      Failed - AST in normal range and within 360 days    AST  Date Value Ref Range Status  07/12/2024 14 (L) 15 - 41 U/L Final         Passed - ALT in normal range and within 360 days    ALT  Date Value Ref Range Status  07/12/2024 17 0 - 44 U/L Final         Passed - Completed PHQ-2 or PHQ-9 in the last 360 days      Passed - Valid encounter within last 6 months    Recent Outpatient Visits           Yesterday Type 2 diabetes mellitus with other specified complication, with long-term current use of insulin  [facility])   [facility], Raguel, MD   3 months ago Type 2 diabetes mellitus with hyperglycemia, with long-term current use of insulin  [facility])   [facility], Raguel, MD   6 months ago Type 2 diabetes mellitus without complication, without long-term current use of insulin  [facility])   [facility] [place] Fleeta Tonia Senior L, RPH-CPP   6 months ago Type 2 diabetes mellitus with hyperglycemia, with long-term current use of insulin  [facility])   [facility], MD   7 months ago Mixed hyperlipidemia    [facility] [place]. [facility] Fleeta Tonia Senior LITTIE, RPH-CPP

## 2024-07-21 ENCOUNTER — Telehealth: Payer: Self-pay | Admitting: Family Medicine

## 2024-07-21 NOTE — Telephone Encounter (Signed)
 Radiology called to let Dr Tanda know that will not be scheduling pt    Per Jenna Cordella LABOR, MD  Do not schedule.  No percutaneous access to  target lesion.

## 2024-07-21 NOTE — Progress Notes (Signed)
 GENERAL SURGERY NEW CLINIC VISIT  Patient: Jessica Ponce MRN: [id number] Date: 07/25/2024  CHIEF COMPLAINT:  Biliary dyskinesia Small bowel mass  HISTORY OF PRESENT ILLNESS:  Jessica Ponce is a 58 y.o. female with a history of HTN, HLD and DM who presents to clinic today for evaluation of biliary dyskinesia and a small bowel mass.  She states that for about 1 month she has noticed a pain to the right side of her abdomen.  She states that the pain is constant although it does fluctuate associated with moving.  She denies any association with eating.  She denies any nausea or vomiting.  She does endorse some diarrhea recently.  A CT A/P was performed on 07/12/24 that I do not have available for review which revealed a 1.5 cm x 1 cm calcified mass abutting the small bowel.  She did have a colonoscopy 3 years ago which revealed 1 polyp per her report. A HIDA was performed on 07/20/24 which revealed an EF of 11%.  MEDS: Current Rx ordered in [facility][1]  PROBLEM LIST: Problem List[2]   ALLERGIES: Allergies[3]   PSH: Surgical History[4]  PMH: Medical History[5]   SOCIAL HISTORY: Social History   Socioeconomic History  . Marital status: Married    Spouse name: Not on file  . Number of children: Not on file  . Years of education: Not on file  . Highest education level: Not on file  Occupational History  . Not on file  Tobacco Use  . Smoking status: Never    Passive exposure: Never  . Smokeless tobacco: Never  Vaping Use  . Vaping status: Never Used  Substance and Sexual Activity  . Alcohol use: Yes    Comment: socially  . Drug use: Never  . Sexual activity: Not on file  Other Topics Concern  . Not on file  Social History Narrative  . Not on file   Social Drivers of Health   Food Insecurity: Food Insecurity Present (07/17/2024)   Received from [facility]   Food vital sign   . Within the past 12 months, you worried that your food would run out before you got money to buy  more: Sometimes true   . Within the past 12 months, the food you bought just didn't last and you didn't have money to get more: Never true  Transportation Needs: No Transportation Needs (07/17/2024)   Received from [facility] - Transportation   . In the past 12 months, has lack of transportation kept you from medical appointments or from getting medications?: No   . In the past 12 months, has lack of transportation kept you from meetings, work, or from getting things needed for daily living?: No  Safety: Not on file  Living Situation: High Risk (07/17/2024)   Received from [facility] Situation   . In the last 12 months, was there a time when you were not able to pay the mortgage or rent on time?: Yes   . In the past 12 months, how many times have you moved where you were living?: 0   . At any time in the past 12 months, were you homeless or living in a shelter (including now)?: No    FAMILY HISTORY: Family History[6]  ROS:  Review of systems is positive as described above and in the HPI.  A complete point review of systems was otherwise negative.  PHYSICAL EXAMINATION: VITAL SIGNS: BP 134/82 (BP Location: Left arm, Patient Position:  Sitting)   Pulse 86   Temp 97.3 F (36.3 C) (Temporal)   Ht 1.651 m (5' 5)   Wt 102 kg (225 lb 3.2 oz)   SpO2 94%   BMI 37.48 kg/m   Gen: pt sitting in chair in NAD HEENT: atraumatic, EOMI, pupils equal and round CV: regular rate and rhythm, normal S1, S2, no murmurs Pulm: normal work of breathing on room air, lungs clear bilaterally Abd: soft, mild tenderness to palpation to the right flank, non-distended, no rebound tenderness or guarding Ext: warm, well perfused, moves all extremities Neuro: GCS 15, no gross deficits Skin: no rashes, no lesions Psych: normal judgement, normal mood, normal affect  LABS:  07/19/24 Hb A1C: 8.7  07/12/24 AST/ALT: 14/17 Tbili: 0.8 Alk phos: 84 Lipase: 34 WBC: 10.4  Albumin: 4.1  I  have reviewed the above labs and they are used to help determine operative planning for the patient including the decision to perform a cholangiogram and if the patient has cholecystitis.  They are also used to help determine operative risk for the patient   IMAGING:  CT A/P (07/12/24): 1. No acute localizing findings in the abdomen or pelvis. No urolithiasis or hydronephrosis.  2. 1.4 x 0.9 calcified mesenteric node in the right hemipelvis, adjacent to a loop of traversing small-bowel, is nonspecific and may relate to sequela of prior infection/inflammation. Carcinoid tumor can not be excluded.  3.  Aortic Atherosclerosis (ICD10-I70.0).  HIDA (07/20/24):  Findings consistent with biliary dyskinesia  I have personally reviewed the imaging report.  ASSESSMENT/PLAN:  58 y.o. female with biliary dyskinesia and a small bowel mass.  It is unclear what this mass is in the small bowel or if it is a mesenteric mass just associated with the bowel.  Regardless, we can excise this at the same time as her lap chole.  We will also run the small bowel to ensure that she does not have another associated mass anywhere.  Regarding her gallbladder, we discussed that she does not have typical symptoms for biliary dyskinesia and that removing her gallbladder may not improve her symptoms which she understands.  Knowing this, she would like to proceed forward with surgery.  - OR for laparoscopic cholecystectomy and laparoscopic small bowel resection on 09/04/24, informed consent obtained in clinic - [facility] visit to be arranged   It was discussed with the patient that these facilities include a teaching institution and that residents and fellows who are physicians in graduate medical training, non-physician practitioners including physician assistants and nurse practitioners, and students in training to be physicians may assist in their care, which includes performing portions of the operation under my supervision.  I will  be present for all critical portions of the operation.    Electronically signed by: Prentice Tanda Pizza, MD 07/25/2024 9:47 AM        [1] Meds Ordered in [facility]  Medication Sig Dispense Refill  . amLODIPine  (NORVASC ) 10 mg tablet Take 1 tablet by mouth daily.    . ezetimibe  (ZETIA ) 10 mg tablet Take 10 mg by mouth daily.    . famotidine (PEPCID) 40 mg tablet Take 1 tablet (40 mg total) by mouth daily. 90 tablet 3  . gabapentin  (NEURONTIN ) 300 mg capsule Take 1 capsule by mouth 3 (three) times a day.    . insulin  aspart U-100 (NovoLOG  FlexPen) 100 unit/mL (3 mL) pen Inject 20 Units under the skin.    . Lantus  Solostar U-100 Insulin  100 unit/mL (3 mL) pen See Admin  Instructions. PLEASE SEE ATTACHED FOR DETAILED DIRECTIONS    . levothyroxine  (SYNTHROID ) 25 mcg tablet Take 25 mcg by mouth daily.    . losartan  (COZAAR ) 50 mg tablet     . montelukast (SINGULAIR) 10 mg tablet     . pantoprazole  (PROTONIX ) 40 mg EC tablet Take 1 tablet (40 mg total) by mouth every morning before breakfast. 90 tablet 3  . PARoxetine (PAXIL) 10 mg tablet Take 1 tablet by mouth daily.    . sertraline  (ZOLOFT ) 100 mg tablet Take 100 mg by mouth daily.     No current Epic-ordered facility-administered medications on file.  [2] Patient Active Problem List Diagnosis  . Anxiety  . Hypertension, essential  . Mixed hyperlipidemia  . Type 2 diabetes mellitus    (CMD)  . Severe obesity (BMI 35.0-39.9) with comorbidity (CMS/HCC)  . Small bowel mass  . Biliary dyskinesia  [3] Allergies Allergen Reactions  . Valsartan Diarrhea  . Semaglutide  Nausea And Vomiting    Multiple episodes of severe vomiting on Ozempic   . Atorvastatin Other (See Comments)    Elevated sugars  . Codeine Nausea And Vomiting and Nausea Only    vomitting  . Metformin Diarrhea  [4] Past Surgical History: Procedure Laterality Date  . APPENDECTOMY    [5] Past Medical History: Diagnosis Date  . Asthma (CMD)   . Diabetes mellitus     (CMD)   . HLD (hyperlipidemia)   . HTN (hypertension)   [6] Family History Problem Relation Name Age of Onset  . Dementia Mother    . Stroke Father    . Thyroid  disease Sister    . Melanoma Sister

## 2024-07-21 NOTE — Progress Notes (Unsigned)
 Jenna Cordella LABOR, MD  Baldwin Rosella L Do not schedule.  No percutaneous access to target lesion.       Previous Messages    ----- Message ----- From: Baldwin Rosella CROME Sent: 07/21/2024   8:50 AM EDT To: Rosella CROME Baldwin; Taryn F Rigney, RT; Ir Proc* Subject: CT FNA BIOPSY 1ST LESION                      Procedure :CT FNA BIOPSY 1ST LESION  Reason :calcified node to rule out carcinoid tumor Dx: Calcified lymph nodes   History :CT Renal Stone Study,DG Abd 1 ViewDG Foot Complete Left,MM 3D SCREENING MAMMOGRAM BILATERAL BREAST,MR BRAIN W WO CONTRAST,CT Angio Chest PE W and/or Wo Contrast  Provider:Wilson, Raguel, MD  Provider contact ; [phone]

## 2024-07-25 ENCOUNTER — Encounter: Payer: Self-pay | Admitting: Family Medicine

## 2024-07-25 ENCOUNTER — Other Ambulatory Visit: Payer: Self-pay | Admitting: Family Medicine

## 2024-07-25 MED ORDER — SERTRALINE HCL 100 MG PO TABS: 100.0000 mg | ORAL_TABLET | Freq: Every day | ORAL | 1 refills | Status: AC

## 2024-08-09 ENCOUNTER — Encounter: Payer: Self-pay | Admitting: Endocrinology

## 2024-08-09 ENCOUNTER — Ambulatory Visit: Admitting: Endocrinology

## 2024-08-09 ENCOUNTER — Other Ambulatory Visit: Payer: Self-pay

## 2024-08-09 VITALS — BP 118/72 | HR 75 | Resp 20 | Ht 65.0 in | Wt 225.6 lb

## 2024-08-09 DIAGNOSIS — E1165 Type 2 diabetes mellitus with hyperglycemia: Secondary | ICD-10-CM | POA: Diagnosis not present

## 2024-08-09 DIAGNOSIS — E038 Other specified hypothyroidism: Secondary | ICD-10-CM | POA: Diagnosis not present

## 2024-08-09 DIAGNOSIS — E1169 Type 2 diabetes mellitus with other specified complication: Secondary | ICD-10-CM | POA: Diagnosis not present

## 2024-08-09 DIAGNOSIS — Z794 Long term (current) use of insulin: Secondary | ICD-10-CM

## 2024-08-09 MED ORDER — NOVOLOG FLEXPEN 100 UNIT/ML ~~LOC~~ SOPN: 10.0000 [IU] | PEN_INJECTOR | Freq: Three times a day (TID) | SUBCUTANEOUS | 3 refills | Status: AC

## 2024-08-09 MED ORDER — INSULIN GLARGINE-YFGN 100 UNIT/ML ~~LOC~~ SOPN: 70.0000 [IU] | PEN_INJECTOR | Freq: Every day | SUBCUTANEOUS | 3 refills | Status: AC

## 2024-08-09 NOTE — Patient Instructions (Signed)
 Diabetes regimen Semglee  70 units daily in the morning.  Okay to adjust NovoLog  based on meal size from 10-15 units.   Moderate Sliding Scale for blood sugar before eating. Blood Glucose        Insulin  60-150                     None 151-200                   3 units 201-250                   5 units 251-300                   7 units 301-350                   9 units 351-400                  11 units    >400                       12 units and call provider  3.  Jardiance  25 mg daily.

## 2024-08-09 NOTE — Progress Notes (Signed)
 Outpatient Endocrinology Note [place] Deiontae Rabel, MD   Patient's Name: Jessica Ponce    DOB: 1966/06/11    MRN: [id number]                                                    REASON OF VISIT: Follow-up for type 2 diabetes mellitus  REFERRING PROVIDER: Tanda Bleacher, MD  PCP: Tanda Bleacher, MD  HISTORY OF PRESENT ILLNESS:   Jessica Ponce is a 58 y.o. old female with past medical history listed below, is here for follow-up for type 2 diabetes mellitus.   Pertinent Diabetes History: Patient is referred to endocrinology for further evaluation and management of uncontrolled type 2 diabetes mellitus.  Patient was diagnosed with type 2 diabetes mellitus several years ago, 15+ years ago, has been on insulin  therapy for 5-7+ years at the time of initial consult.  She was diagnosed with gestational diabetes in 1999, then had prediabetes and ultimately developed type 2 diabetes mellitus over time.  She has uncontrolled type 2 diabetes mellitus with hemoglobin A1c in the range of 7.9-11.9 range in last few years.  Patient reports intolerance with metformin and Ozempic  in the past.  She denies taking any other antidiabetic medication other than multidose insulin  therapy in the past.  History of DKA or diabetes related hospitalizations: ? None, may be for hyperglycemia.  No history of diabetes ketoacidosis.  Previous diabetes education: Yes , long time ago.   Family h/o diabetes mellitus: none   Chronic Diabetes Complications : Retinopathy: no. Last ophthalmology exam was done on annually, 05/2022, following with ophthalmology regularly.  Nephropathy: no, on ACE/ARB / losartan  Peripheral neuropathy: numbness / tingling, on gabapentin  from neurology for facial numbness.  Coronary artery disease: no Stroke: TIA / ? stroke  Relevant comorbidities and cardiovascular risk factors: Obesity: yes Body mass index is 37.54 kg/m.  Hypertension: Yes  Hyperlipidemia : Yes, not on statin. Statin, increase sugars and  not feel good /statin intolerance, managed by cardiology/lipid clinic, planning for Repatha.  Current / Home Diabetic regimen includes:  Semglee  / glargine 64 units daily in the morning.  Novolog  5-10 units with meals 3 times a day, before meals.  Taking 1-2 times a day.   Prior diabetic medications: Metformin XR / regular GI intolerance. Ozempic  caused severe sickness / N/V.   Glycemic data:    CONTINUOUS GLUCOSE MONITORING SYSTEM (CGMS) INTERPRETATION: At today's visit, we reviewed CGM downloads. The full report is scanned in the media. Reviewing the CGM trends, blood glucose are as follows:  Dexcom G7 CGM-  Sensor Download (Sensor download was reviewed and summarized below.) Dates: August 28 to August 09, 2024, 14 days  Glucose Management Indicator: n/a% Sensor Average: 185 SD 35 Sensor usage : 67%    Previous:    Previous:    Interpretation: Frequent usually mild hyperglycemia related to meals with blood sugar usually in the range of 200s and sometimes up to 300 range postprandially.  Most of the blood sugar overnight mostly with mild hyperglycemia.  No hypoglycemia.  She has blood sugar data for 3 out of 14 days, not enough glucose data to interpret properly.  Hypoglycemia: Patient has no hypoglycemic episodes. Patient has hypoglycemia awareness.  Factors modifying glucose control: 1.  Diabetic diet assessment: 3 meals a day.   2.  Staying active or exercising: Has planter fascitis  on orthopedic boot.  Not able to exercise and walk at this time.  3.  Medication compliance: compliant most of the time.  # Subclinical hypothyroidism : - Patient had mildly elevated TSH of 4.74 in February 07, 2024 with symptoms suggestive of hypothyroidism with fatigue and gradual weight gain, was started on levothyroxine  25 mcg daily.  Patient had negative thyroid  peroxidase antibody and thyroglobulin antibody.  She had normal thyroid  function test in June 2025.  Interval history   Dexcom CGM data as reviewed above.  Frequent postprandial hyperglycemia.  Patient reports last few weeks very stressful with daughter being hospitalized.  She was also traveling to [place]  and did not have proper diet.  Hemoglobin A1c was slightly better but is still high 8.7% last month.  Patient reports she has not been taking NovoLog  much, usually she is taking 5 to 10 units with meals occasionally.  Diabetes regimen as reviewed above.  There is a plan for laparoscopic cholecystectomy and laparoscopic small bowel resection on October 6 at [facility].  She has been taking levothyroxine  25 mcg daily.  She had normal thyroid  function test in June.  She denies any palpitation or heat intolerance.  No hypo and hyperthyroid symptoms.  She has no other complaints today.  REVIEW OF SYSTEMS As per history of present illness.   PAST MEDICAL HISTORY: Past Medical History:  Diagnosis Date   Allergy    Anxiety 01-2019   Arthritis    Asthma    no current problems, does not use inhaler   Depression    Some wirg covid lock down   Diabetes mellitus without complication (HCC)    type 2   Essential hypertension    Hyperlipidemia    Stroke (HCC)     PAST SURGICAL HISTORY: Past Surgical History:  Procedure Laterality Date   APPENDECTOMY  1985   exploratory laparoscopy     WISDOM TOOTH EXTRACTION      ALLERGIES: Allergies  Allergen Reactions   Valsartan Diarrhea   Ozempic  (0.25 Or 0.5 Mg-Dose) [Semaglutide (0.25 Or 0.5mg -Dos)] Nausea And Vomiting    Multiple episodes of severe vomiting on Ozempic    Atorvastatin Other (See Comments)    Elevated sugars   Codeine Nausea And Vomiting   Metformin And Related Diarrhea    FAMILY HISTORY:  Family History  Problem Relation Age of Onset   Memory loss Mother    Breast cancer Mother 67   Cancer Mother    Stroke Father    Breast cancer Maternal Aunt 74   Breast cancer Paternal Grandmother 110 - 89   Cancer Maternal Aunt    Colon cancer Neg  Hx    Rectal cancer Neg Hx    Stomach cancer Neg Hx    Esophageal cancer Neg Hx     SOCIAL HISTORY: Social History   Socioeconomic History   Marital status: Married    Spouse name: Ozell   Number of children: 2   Years of education: 16   Highest education level: Bachelor's degree (e.g., BA, AB, BS)  Occupational History   Occupation: Self Employed  Tobacco Use   Smoking status: Never    Passive exposure: Never   Smokeless tobacco: Never  Vaping Use   Vaping status: Never Used  Substance and Sexual Activity   Alcohol use: Yes    Alcohol/week: 0.0 standard drinks of alcohol    Comment: occasional wine   Drug use: No   Sexual activity: Yes    Birth control/protection: Post-menopausal  Other Topics  Concern   Not on file  Social History Narrative   Lives with husband and 2 kids   Caffeine use: Drinks 2-3 cups coffee per day   Social Drivers of Health   Financial Resource Strain: Medium Risk (07/17/2024)   Overall Financial Resource Strain (CARDIA)    Difficulty of Paying Living Expenses: Somewhat hard  Food Insecurity: Food Insecurity Present (07/17/2024)   Hunger Vital Sign    Worried About Running Out of Food in the Last Year: Sometimes true    Ran Out of Food in the Last Year: Never true  Transportation Needs: No Transportation Needs (07/17/2024)   PRAPARE - Administrator, Civil Service (Medical): No    Lack of Transportation (Non-Medical): No  Physical Activity: Inactive (07/17/2024)   Exercise Vital Sign    Days of Exercise per Week: 0 days    Minutes of Exercise per Session: Not on file  Stress: Stress Concern Present (07/17/2024)   [employer] of Occupational Health - Occupational Stress Questionnaire    Feeling of Stress: Rather much  Social Connections: Moderately Isolated (07/17/2024)   Social Connection and Isolation Panel    Frequency of Communication with Friends and Family: Once a week    Frequency of Social Gatherings with Friends and  Family: Once a week    Attends Religious Services: 1 to 4 times per year    Active Member of [employer] or Organizations: No    Attends Engineer, structural: Not on file    Marital Status: Married    MEDICATIONS:  Current Outpatient Medications  Medication Sig Dispense Refill   albuterol  (PROVENTIL ) (2.5 MG/3ML) 0.083% nebulizer solution Take 2.5 mg by nebulization every 4 (four) hours as needed for shortness of breath or wheezing.     amLODipine  (NORVASC ) 10 MG tablet TAKE 1 TABLET BY MOUTH EVERY DAY 90 tablet 1   aspirin  EC 81 MG tablet Take 1 tablet (81 mg total) by mouth daily. Swallow whole. 30 tablet 12   baclofen  (LIORESAL ) 10 MG tablet Take 1 tablet (10 mg total) by mouth 2 (two) times daily as needed for muscle spasms. 20 each 0   budesonide-formoterol (SYMBICORT ) 160-4.5 MCG/ACT inhaler Inhale 2 puffs into the lungs in the morning and at bedtime. 30.6 g 12   buPROPion  (WELLBUTRIN  XL) 150 MG 24 hr tablet Take 1 tablet (150 mg total) by mouth daily. 90 tablet 0   Continuous Glucose Sensor (DEXCOM G7 SENSOR) MISC Use to check blood glucose throughout the day. Changes sensors once every 10 days. 9 each 3   dicyclomine (BENTYL) 20 MG tablet Take 20 mg by mouth 4 (four) times daily.     doxycycline  (VIBRAMYCIN ) 50 MG capsule TAKE 1 CAPSULE BY MOUTH EVERY DAY 30 capsule 5   empagliflozin  (JARDIANCE ) 25 MG TABS tablet Take 1 tablet (25 mg total) by mouth daily before breakfast. 90 tablet 3   ezetimibe  (ZETIA ) 10 MG tablet TAKE 1 TABLET BY MOUTH EVERY DAY 90 tablet 1   famotidine (PEPCID) 40 MG tablet Take 40 mg by mouth daily.     gabapentin  (NEURONTIN ) 300 MG capsule TAKE 1 CAPSULE BY MOUTH THREE TIMES A DAY 84 capsule 1   insulin  glargine (LANTUS  SOLOSTAR) 100 UNIT/ML Solostar Pen See Admin Instructions. PLEASE SEE ATTACHED FOR DETAILED DIRECTIONS     Insulin  Pen Needle (BD PEN NEEDLE NANO 2ND GEN) 32G X 4 MM MISC Use to inject insulin  a total of 4 times daily. 400 each 2  levothyroxine  (SYNTHROID ) 25 MCG tablet Take 1 tablet (25 mcg total) by mouth daily. 90 tablet 3   losartan  (COZAAR ) 100 MG tablet TAKE 1 TABLET BY MOUTH EVERY DAY 90 tablet 1   metroNIDAZOLE  (METROCREAM ) 0.75 % cream Apply topically 2 (two) times daily. 45 g 3   omeprazole  (PRILOSEC) 20 MG capsule TAKE 1 CAPSULE BY MOUTH EVERY DAY 90 capsule 1   pantoprazole  (PROTONIX ) 40 MG tablet Take 40 mg by mouth.     sertraline  (ZOLOFT ) 100 MG tablet Take 1 tablet (100 mg total) by mouth daily. 90 tablet 1   sucralfate (CARAFATE) 1 g tablet Take 1 g by mouth 4 (four) times daily.     Tiotropium Bromide Monohydrate  (SPIRIVA  RESPIMAT) 1.25 MCG/ACT AERS Inhale 2 puffs into the lungs daily. 12 g 0   valACYclovir  (VALTREX ) 1000 MG tablet Take 1 tablet (1,000 mg total) by mouth 3 (three) times daily. 21 tablet 0   VENTOLIN  HFA 108 (90 Base) MCG/ACT inhaler Inhale 2 puffs into the lungs every 4 (four) hours as needed for wheezing or shortness of breath. 1 each 2   insulin  aspart (NOVOLOG  FLEXPEN) 100 UNIT/ML FlexPen Inject 10-15 Units into the skin 3 (three) times daily with meals. Plus sliding scale as instructed.  Maximum 60 units/day. 45 mL 3   insulin  glargine-yfgn (SEMGLEE ) 100 UNIT/ML Pen Inject 70 Units into the skin daily. 45 mL 3   No current facility-administered medications for this visit.    PHYSICAL EXAM: Vitals:   08/09/24 0949  BP: 118/72  Pulse: 75  Resp: 20  SpO2: 96%  Weight: 225 lb 9.6 oz (102.3 kg)  Height: 5' 5 (1.651 m)   Body mass index is 37.54 kg/m.  Wt Readings from Last 3 Encounters:  08/09/24 225 lb 9.6 oz (102.3 kg)  07/19/24 225 lb 3.2 oz (102.2 kg)  07/10/24 229 lb 8 oz (104.1 kg)    General: Well developed, well nourished female in no apparent distress.  HEENT: AT/Section, no external lesions.  Eyes: Conjunctiva clear and no icterus. Neck: Neck supple  Lungs: Respirations not labored Neurologic: Alert, oriented, normal speech Extremities / Skin: Dry.   Psychiatric:  Does not appear depressed or anxious  Diabetic Foot Exam - Simple   No data filed    LABS Reviewed Lab Results  Component Value Date   HGBA1C 8.7 (A) 07/19/2024   HGBA1C 9.0 (A) 04/17/2024   HGBA1C 10.0 (A) 12/13/2023   No results found for: FRUCTOSAMINE Lab Results  Component Value Date   CHOL 267 (H) 10/04/2023   HDL 66 10/04/2023   LDLCALC 153 (H) 10/04/2023   TRIG 266 (H) 10/04/2023   CHOLHDL 4.0 10/04/2023   Lab Results  Component Value Date   MICRALBCREAT NOTE 05/04/2024   MICRALBCREAT 7 05/14/2023   Lab Results  Component Value Date   CREATININE 0.85 07/12/2024   No results found for: GFR  ASSESSMENT / PLAN  1. Uncontrolled type 2 diabetes mellitus with hyperglycemia (HCC)   2. Type 2 diabetes mellitus with other specified complication, with long-term current use of insulin  (HCC)   3. Subclinical hypothyroidism     Diabetes Mellitus type 2, complicated by stroke/peripheral neuropathy. - Diabetic status / severity: Uncontrolled.  Lab Results  Component Value Date   HGBA1C 8.7 (A) 07/19/2024    - Hemoglobin A1c goal : <6.5%  She will benefit from weight loss diabetic medication including GLP-1 receptor agonist, however with the prior severe side effect from Ozempic  will not try at  this time.  Still with hyperglycemia and uncontrolled diabetes.  Adjust insulin  /diabetes regimen as follows.  Discussed that she needs better control of diabetes for better wound healing after planned surgery in October.  - Medications: See below. Diabetes regimen Increase Semglee  64 units daily in the morning to 70 units Okay to adjust NovoLog  based on meal size from 5-10 units to 10 to 15 units, up to 3 times a day with meals.  Moderate Sliding Scale for blood sugar before eating. Blood Glucose        Insulin  60-150                     None 151-200                   3 units 201-250                   5 units 251-300                   7 units 301-350                    9 units 351-400                  11 units    >400                       12 units and call provider  3.  Continue Jardiance  25 mg daily.   - Home glucose testing: Continue Dexcom G7 and check as needed.  - Discussed/ Gave Hypoglycemia treatment plan.  # Consult : Offered dietitian visit, patient declined.  # Annual urine for microalbuminuria/ creatinine ratio, no microalbuminuria currently, continue ACE/ARB /losartan .  Last  Lab Results  Component Value Date   MICRALBCREAT NOTE 05/04/2024    # Foot check nightly / neuropathy, on gabapentin  from neurology.  # Annual dilated diabetic eye exams.   - Diet: Make healthy diabetic food choices.  Discussed in detail about diet plan including avoiding snacks, limiting carbohydrates, and increase amount of vegetables and grains. - Life style / activity / exercise: Discussed.  2. Blood pressure  -  BP Readings from Last 1 Encounters:  08/09/24 118/72    - Control is in target.  - No change in current plans.  3. Lipid status / Hyperlipidemia - Last  Lab Results  Component Value Date   LDLCALC 153 (H) 10/04/2023   -Following with cardiology and lipid clinic, planning for Repatha. ?  Had statin intolerance in the past.  # Subclinical hypothyroidism : - Levothyroxine  25 mcg daily started in March 2025.  Continue current dose.  Diagnoses and all orders for this visit:  Uncontrolled type 2 diabetes mellitus with hyperglycemia (HCC)  Type 2 diabetes mellitus with other specified complication, with long-term current use of insulin  (HCC) -     insulin  glargine-yfgn (SEMGLEE ) 100 UNIT/ML Pen; Inject 70 Units into the skin daily. -     insulin  aspart (NOVOLOG  FLEXPEN) 100 UNIT/ML FlexPen; Inject 10-15 Units into the skin 3 (three) times daily with meals. Plus sliding scale as instructed.  Maximum 60 units/day.  Subclinical hypothyroidism    DISPOSITION Follow up in clinic in 3 months suggested.  Asked to call our clinic with  any question in between the visits.  All questions answered and patient verbalized understanding of the plan.  [place] Marytza Grandpre, MD [facility] Endocrinology [facility] [address]  [place], [address] [place], [place] [address] Phone # [phone]  At least part of this note was generated using voice recognition software. Inadvertent word errors may have occurred, which were not recognized during the proofreading process.

## 2024-09-11 ENCOUNTER — Ambulatory Visit: Admitting: Dermatology

## 2024-09-11 ENCOUNTER — Encounter: Payer: Self-pay | Admitting: Dermatology

## 2024-09-11 DIAGNOSIS — L719 Rosacea, unspecified: Secondary | ICD-10-CM

## 2024-09-11 DIAGNOSIS — L570 Actinic keratosis: Secondary | ICD-10-CM

## 2024-09-11 DIAGNOSIS — L821 Other seborrheic keratosis: Secondary | ICD-10-CM

## 2024-09-11 DIAGNOSIS — W908XXA Exposure to other nonionizing radiation, initial encounter: Secondary | ICD-10-CM | POA: Diagnosis not present

## 2024-09-11 DIAGNOSIS — B353 Tinea pedis: Secondary | ICD-10-CM

## 2024-09-11 DIAGNOSIS — D229 Melanocytic nevi, unspecified: Secondary | ICD-10-CM

## 2024-09-11 DIAGNOSIS — Z1283 Encounter for screening for malignant neoplasm of skin: Secondary | ICD-10-CM | POA: Diagnosis not present

## 2024-09-11 DIAGNOSIS — L578 Other skin changes due to chronic exposure to nonionizing radiation: Secondary | ICD-10-CM

## 2024-09-11 DIAGNOSIS — D1801 Hemangioma of skin and subcutaneous tissue: Secondary | ICD-10-CM

## 2024-09-11 DIAGNOSIS — L814 Other melanin hyperpigmentation: Secondary | ICD-10-CM

## 2024-09-11 DIAGNOSIS — D239 Other benign neoplasm of skin, unspecified: Secondary | ICD-10-CM

## 2024-09-11 MED ORDER — DOXYCYCLINE HYCLATE 50 MG PO CAPS: 50.0000 mg | ORAL_CAPSULE | Freq: Every day | ORAL | 5 refills | Status: AC

## 2024-09-11 MED ORDER — METRONIDAZOLE 0.75 % EX CREA: TOPICAL_CREAM | Freq: Two times a day (BID) | CUTANEOUS | 3 refills | Status: AC

## 2024-09-11 NOTE — Patient Instructions (Addendum)

## 2024-09-11 NOTE — Progress Notes (Signed)
 Follow-Up Visit   Subjective  Jessica Ponce is a 58 y.o. female who presents for the following: Skin Cancer Screening and Full Body Skin Exam  The patient presents for Total-Body Skin Exam (TBSE) for skin cancer screening and mole check. The patient has spots, moles and lesions to be evaluated, some may be new or changing.  Patient has no personal history of skin cancer. Her sister has a history of melanoma and her father has had basal cell carcinoma and squamous cell carcinoma.   The following portions of the chart were reviewed this encounter and updated as appropriate: medications, allergies, medical history  Review of Systems:  No other skin or systemic complaints except as noted in HPI or Assessment and Plan.  Objective  Well appearing patient in no apparent distress; mood and affect are within normal limits.  A full examination was performed including scalp, head, eyes, ears, nose, lips, neck, chest, axillae, abdomen, back, buttocks, bilateral upper extremities, bilateral lower extremities, hands, feet, fingers, toes, fingernails, and toenails. All findings within normal limits unless otherwise noted below.   Relevant physical exam findings are noted in the Assessment and Plan.  Left Tip of Nose, Right Tip of Nose Erythematous thin papules/macules with gritty scale.   Assessment & Plan   SKIN CANCER SCREENING PERFORMED TODAY.  ACTINIC DAMAGE - Chronic condition, secondary to cumulative UV/sun exposure - diffuse scaly erythematous macules with underlying dyspigmentation - Recommend daily broad spectrum sunscreen SPF 30+ to sun-exposed areas, reapply every 2 hours as needed.  - Staying in the shade or wearing long sleeves, sun glasses (UVA+UVB protection) and wide brim hats (4-inch brim around the entire circumference of the hat) are also recommended for sun protection.  - Call for new or changing lesions.  LENTIGINES, SEBORRHEIC KERATOSES, HEMANGIOMAS - Benign normal skin  lesions - Benign-appearing - Call for any changes  MELANOCYTIC NEVI - Tan-brown and/or pink-flesh-colored symmetric macules and papules - Benign appearing on exam today - Observation - Call clinic for new or changing moles - Recommend daily use of broad spectrum spf 30+ sunscreen to sun-exposed areas.   ROSACEA Exam Mid face erythema with telangiectasias +/- scattered inflammatory papules   Chronic and persistent condition with duration or expected duration over one year. Condition is bothersome/symptomatic for patient. Currently flared.    Rosacea is a chronic progressive skin condition usually affecting the face of adults, causing redness and/or acne bumps. It is treatable but not curable. It sometimes affects the eyes (ocular rosacea) as well. It may respond to topical and/or systemic medication and can flare with stress, sun exposure, alcohol, exercise, topical steroids (including hydrocortisone/cortisone 10) and some foods.  Daily application of broad spectrum spf 30+ sunscreen to face is recommended to reduce flares.   Treatment Plan Uses sulfur soap to wash face  -Continue Doxycycline  50 mg daily - Continue Metrocream  twice a day  DERMATOFIBROMA Exam: Firm pink/brown papulenodule with dimple sign.  Treatment Plan: A dermatofibroma is a benign growth possibly related to trauma, such as an insect bite, cut from shaving, or inflamed acne-type bump.  Treatment options to remove include shave or excision with resulting scar and risk of recurrence.  Since benign-appearing and not bothersome, will observe for now.    TINEA PEDIS- acute condition not at treatment goal Exam: Scaling and maceration web spaces and over distal and lateral soles.   Treatment Plan: Discussed oral terbinafine  but due to recent surgery have elected not to pursue. Advised patient to continue OTC fungal cream.  ROSACEA   Related Medications doxycycline  (VIBRAMYCIN ) 50 MG capsule Take 1 capsule (50 mg  total) by mouth daily. AK (ACTINIC KERATOSIS) (2) Left Tip of Nose, Right Tip of Nose Destruction of lesion - Left Tip of Nose, Right Tip of Nose Complexity: simple   Destruction method: cryotherapy   Informed consent: discussed and consent obtained   Timeout:  patient name, date of birth, surgical site, and procedure verified Lesion destroyed using liquid nitrogen: Yes   Region frozen until ice ball extended beyond lesion: Yes   Outcome: patient tolerated procedure well with no complications   Post-procedure details: wound care instructions given    TINEA PEDIS OF RIGHT FOOT   CHERRY ANGIOMA   MULTIPLE BENIGN NEVI   LENTIGINES   SEBORRHEIC KERATOSIS   ACTINIC SKIN DAMAGE   DERMATOFIBROMA     Return in about 1 year (around 09/11/2025) for FBSE.  LILLETTE Rollene Gobble, RN, am acting as scribe for RUFUS CHRISTELLA HOLY, MD .   Documentation: I have reviewed the above documentation for accuracy and completeness, and I agree with the above.  RUFUS CHRISTELLA HOLY, MD

## 2024-09-12 ENCOUNTER — Other Ambulatory Visit: Payer: Self-pay | Admitting: Family Medicine

## 2024-10-17 ENCOUNTER — Other Ambulatory Visit: Payer: Self-pay | Admitting: Family Medicine

## 2024-10-17 DIAGNOSIS — I10 Essential (primary) hypertension: Secondary | ICD-10-CM

## 2024-10-19 ENCOUNTER — Ambulatory Visit: Admitting: Family Medicine

## 2024-11-09 ENCOUNTER — Ambulatory Visit: Admitting: Endocrinology

## 2024-11-13 ENCOUNTER — Encounter: Payer: Self-pay | Admitting: Pulmonary Disease

## 2024-11-13 ENCOUNTER — Ambulatory Visit: Admitting: Pulmonary Disease

## 2024-11-13 VITALS — BP 148/88 | HR 84 | Temp 98.5°F | Ht 65.0 in | Wt 222.4 lb

## 2024-11-13 DIAGNOSIS — K219 Gastro-esophageal reflux disease without esophagitis: Secondary | ICD-10-CM

## 2024-11-13 DIAGNOSIS — B349 Viral infection, unspecified: Secondary | ICD-10-CM

## 2024-11-13 DIAGNOSIS — J4551 Severe persistent asthma with (acute) exacerbation: Secondary | ICD-10-CM

## 2024-11-13 DIAGNOSIS — G4733 Obstructive sleep apnea (adult) (pediatric): Secondary | ICD-10-CM

## 2024-11-13 DIAGNOSIS — J4540 Moderate persistent asthma, uncomplicated: Secondary | ICD-10-CM

## 2024-11-13 LAB — NITRIC OXIDE: Nitric Oxide: 113

## 2024-11-13 MED ORDER — ALBUTEROL SULFATE HFA 108 (90 BASE) MCG/ACT IN AERS: 2.0000 | INHALATION_SPRAY | Freq: Four times a day (QID) | RESPIRATORY_TRACT | 2 refills | Status: AC | PRN

## 2024-11-13 MED ORDER — PREDNISONE 20 MG PO TABS: 20.0000 mg | ORAL_TABLET | Freq: Every day | ORAL | 0 refills | Status: AC

## 2024-11-13 NOTE — Progress Notes (Signed)
 Synopsis: Referred in by Tanda Bleacher, MD   Subjective:   PATIENT ID: Jessica Ponce GENDER: female DOB: [birth year], MRN: [id number]  No chief complaint on file.   HPI Jessica Ponce is a pleasant 58 year old female patient with a past medical history of mild intermittent asthma, hypertension, depression on Wellbutrin  presenting to the pulmonary clinic for further evaluation of her asthma.  She said she contracted COVID in end of August September she started having worsening symptoms.  Described as chest tightness and inability to take in deep breath.  Also reports dry cough persistent throughout the day.  She was started on prednisone  taper by her primary care physician. She also been using albuterol  nebulizer and albuterol  inhaler as needed on a daily basis.  Completed steroid taper. Feeling better but not fully back to baseline. Not using her rescue inhaler.   She was diagnosed with asthma as a child, before that episode has been only on albuterol  inhaler as needed.  She has never been hospitalized or intubated for asthma.  Her triggers are mostly viral infections but also she is somewhat sensitive to strong scents and high humidity.  In the interim, she was seen by colleague Dr. Tamea who added LAMA to her regimen.   PFTs 10/2023 with signs of gas trapping and decreased PEFR  w/ nl DLCO all consistent with asthma.   Allergen Panel 2024 was negative/   Family history - she denies any family history of asthma  Social history - never smoker denies alcohol use and denies illicit drug use.  Works as a lawyer  OV 05/16/2024 - Jessica Ponce is here to follow up on her asthma. She is doing overall well. Has no major issues. Continues on triple inhaler therapy. ACT 21. FENO 22.   OV 11/13/2024 - Jessica Ponce is here to follow-up on her severe persistent asthma.  It appears that she is in an asthma flare and recently contracted a viral infection over Thanksgiving.  She is currently  persistently coughing and feels that her chest is tight all the time despite being compliant with her Symbicort  and Spiriva .  Her Feno in office was 113.  I will prescribe her prednisone  course for 5 days and advised on continuing current inhaler regimen.  Review of Systems  Eyes:  Positive for photophobia.   All systems were reviewed and are negative except for the above. Objective:   There were no vitals filed for this visit.     on RA BMI Readings from Last 3 Encounters:  08/09/24 37.54 kg/m  07/19/24 37.48 kg/m  07/10/24 38.19 kg/m   Wt Readings from Last 3 Encounters:  08/09/24 225 lb 9.6 oz (102.3 kg)  07/19/24 225 lb 3.2 oz (102.2 kg)  07/10/24 229 lb 8 oz (104.1 kg)    Physical Exam GEN: NAD, Healthy Appearing HEENT: Supple Neck, Reactive Pupils, EOMI  CVS: Normal S1, Normal S2, RRR, systolic murmur heard best at the right sternal border. Lungs: Clear bilateral air entry.  Abdomen: Soft, non tender, non distended, + BS  Extremities: Warm and well perfused, No edema  Skin: No suspicious lesions appreciated  Psych: Normal Affect  Labs and imaging were reviewed.  Ancillary Information   CBC    Component Value Date/Time   WBC 10.4 07/12/2024 0911   RBC 4.79 07/12/2024 0911   HGB 13.7 07/12/2024 0911   HGB 13.5 07/01/2024 1429   HCT 41.2 07/12/2024 0911   HCT 42.4 07/01/2024 1429   PLT 324 07/12/2024 0911  PLT 341 07/01/2024 1429   MCV 86.0 07/12/2024 0911   MCV 89 07/01/2024 1429   MCH 28.6 07/12/2024 0911   MCHC 33.3 07/12/2024 0911   RDW 13.6 07/12/2024 0911   RDW 13.4 07/01/2024 1429   LYMPHSABS 3.3 (H) 07/01/2024 1429   MONOABS 0.4 10/01/2023 1723   EOSABS 0.6 (H) 07/01/2024 1429   BASOSABS 0.0 07/01/2024 1429   Labs and imaging were reviewed.     Latest Ref Rng & Units 11/02/2023    2:26 PM  PFT Results  FVC-Pre L 2.32   FVC-Predicted Pre % 66   FVC-Post L 2.43   FVC-Predicted Post % 69   Pre FEV1/FVC % % 78   Post FEV1/FCV % % 79    FEV1-Pre L 1.82   FEV1-Predicted Pre % 67   FEV1-Post L 1.93   DLCO uncorrected ml/min/mmHg 17.23   DLCO UNC% % 81   DLVA Predicted % 100   TLC L 4.71   TLC % Predicted % 90   RV % Predicted % 117     Assessment & Plan:  Jessica Ponce is a pleasant 58 year old female patient with a past medical history of mild intermittent asthma, hypertension, depression on Wellbutrin  presenting to the pulmonary clinic for further evaluation of her asthma.  #Severe persistent asthma  #Asthma Flare 11/13/2024  EOS 500 Allergen panel is negative and IgE 6.  FENO 54 --> 41 12/5 --> 22 04/2024 (indicating improvement and compliance with therapy)  FENO 113 11/13/2024 -indicating eosinophilic inflammation and consistent with an asthma flare.  ACT 12/05 - 16. --> 20 01/2024 --> 21 04/2024  Exacerbated by a viral infection. No eczema or allergic rhinitis. No hypersensitivites to cold air or humidity.   []  C/w Budesonide-Formoterol [Symbicort ] 160-4.5 2 puffs BID. Advised mouth rinse post each use.  []  C/w Spiriva  daily  []  C/w Albuterol  2puffs Q6H as needed  []  Prednisone  20 mg 1 tab daily for 5 days. []  This is her second flare since I last saw her, we will discuss starting Biologics if necessary at next visit.  #OSA on CPAP compliant.  #GERD on pantoprazole  40mg  PO daily  RTC 4 months.  I personally spent a total of 30 minutes in the care of the patient today including preparing to see the patient, getting/reviewing separately obtained history, performing a medically appropriate exam/evaluation, counseling and educating, placing orders, documenting clinical information in the EHR, independently interpreting results, and communicating results.   Darrin Barn, MD [facility] 11/13/2024 4:34 PM

## 2024-11-28 ENCOUNTER — Ambulatory Visit: Admitting: Endocrinology

## 2024-12-06 ENCOUNTER — Other Ambulatory Visit: Payer: Self-pay

## 2024-12-07 ENCOUNTER — Other Ambulatory Visit: Payer: Self-pay | Admitting: Family Medicine

## 2024-12-07 ENCOUNTER — Other Ambulatory Visit: Payer: Self-pay | Admitting: Endocrinology

## 2024-12-07 DIAGNOSIS — E038 Other specified hypothyroidism: Secondary | ICD-10-CM

## 2024-12-08 ENCOUNTER — Telehealth: Payer: Self-pay

## 2024-12-08 ENCOUNTER — Other Ambulatory Visit: Payer: Self-pay

## 2024-12-08 NOTE — Telephone Encounter (Signed)
 Pharmacy Patient Advocate Encounter  Received notification from [employer] that Prior Authorization for [facility] G7 SENSOR has been DENIED.  Full denial letter will be uploaded to the media tab. See denial reason below.   PA #/Case ID/Reference #: [id number]  [facility] reviewed your [facility] G7 SENSOR request for the above-identified member, and it is denied  for the following reason: because we did not see what we need to approve the device you asked for,  (Dexcom G7 sensor). We may be able to approve this device when we see certain records (records that  you have been using the device correctly [the continuous glucose monitoring system as prescribed];  records that you have been able to improve blood sugar [glycemic] control or records that you  continue to use a device called an external insulin  pump; records that you have had a face-to-face  meeting with the ordering practitioner no more than 3 months prior to submission of the  reauthorization request to see how well this device is working for you [facility] the efficacy of the  continuous glucose monitoring system]; records that you have been able to maintain or further  improve blood sugar [glycemic] control or records that you continue to use a device called an external  insulin  pump).

## 2024-12-11 ENCOUNTER — Encounter: Payer: Self-pay | Admitting: Family Medicine

## 2024-12-11 ENCOUNTER — Ambulatory Visit: Admitting: Family Medicine

## 2024-12-11 VITALS — BP 123/84 | HR 78 | Ht 65.0 in | Wt 226.4 lb

## 2024-12-11 DIAGNOSIS — Z794 Long term (current) use of insulin: Secondary | ICD-10-CM | POA: Diagnosis not present

## 2024-12-11 DIAGNOSIS — E1169 Type 2 diabetes mellitus with other specified complication: Secondary | ICD-10-CM

## 2024-12-11 DIAGNOSIS — I10 Essential (primary) hypertension: Secondary | ICD-10-CM

## 2024-12-11 DIAGNOSIS — Z9049 Acquired absence of other specified parts of digestive tract: Secondary | ICD-10-CM | POA: Diagnosis not present

## 2024-12-11 DIAGNOSIS — E782 Mixed hyperlipidemia: Secondary | ICD-10-CM | POA: Diagnosis not present

## 2024-12-11 DIAGNOSIS — E66812 Obesity, class 2: Secondary | ICD-10-CM | POA: Diagnosis not present

## 2024-12-11 DIAGNOSIS — F32A Depression, unspecified: Secondary | ICD-10-CM | POA: Diagnosis not present

## 2024-12-11 DIAGNOSIS — F419 Anxiety disorder, unspecified: Secondary | ICD-10-CM | POA: Diagnosis not present

## 2024-12-11 DIAGNOSIS — Z6837 Body mass index (BMI) 37.0-37.9, adult: Secondary | ICD-10-CM

## 2024-12-11 LAB — POCT GLYCOSYLATED HEMOGLOBIN (HGB A1C): HbA1c, POC (controlled diabetic range): 9.5 % — AB (ref 0.0–7.0)

## 2024-12-11 NOTE — Progress Notes (Signed)
 "  Established Patient Office Visit  Subjective    Patient ID: Jessica Ponce, female    DOB: 1966/07/06  Age: 59 y.o. MRN: [id number]  CC:  Chief Complaint  Patient presents with   Medical Management of Chronic Issues    Pt reports getting her gall bladder removed on Sep 04 2024. Reports she had an endoscopy and was told the lining of her stomach was inflammed     HPI Jessica Ponce presents for routine follow up of diabetes and hypertension. Patient also reports that she had gallbladder removed in October 2025. She reports that she is doing well, compliant with meds and denies acute complaints.   Outpatient Encounter Medications as of 12/11/2024  Medication Sig   albuterol  (PROVENTIL ) (2.5 MG/3ML) 0.083% nebulizer solution Take 2.5 mg by nebulization every 4 (four) hours as needed for shortness of breath or wheezing.   albuterol  (VENTOLIN  HFA) 108 (90 Base) MCG/ACT inhaler Inhale 2 puffs into the lungs every 6 (six) hours as needed for wheezing or shortness of breath.   amLODipine  (NORVASC ) 10 MG tablet TAKE 1 TABLET BY MOUTH AT BEDTIME   buPROPion  (WELLBUTRIN  XL) 150 MG 24 hr tablet TAKE 1 TABLET BY MOUTH AT BEDTIME   Continuous Glucose Sensor (DEXCOM G7 SENSOR) MISC Use to check blood glucose throughout the day. Changes sensors once every 10 days.   doxycycline  (VIBRAMYCIN ) 50 MG capsule Take 1 capsule (50 mg total) by mouth daily.   empagliflozin  (JARDIANCE ) 25 MG TABS tablet Take 1 tablet (25 mg total) by mouth daily before breakfast.   insulin  aspart (NOVOLOG  FLEXPEN) 100 UNIT/ML FlexPen Inject 10-15 Units into the skin 3 (three) times daily with meals. Plus sliding scale as instructed.  Maximum 60 units/day.   insulin  glargine-yfgn (SEMGLEE ) 100 UNIT/ML Pen Inject 70 Units into the skin daily.   Insulin  Pen Needle (BD PEN NEEDLE NANO 2ND GEN) 32G X 4 MM MISC Use to inject insulin  a total of 4 times daily.   levothyroxine  (SYNTHROID ) 25 MCG tablet TAKE 1 TABLET BY MOUTH EVERY DAY    losartan  (COZAAR ) 100 MG tablet TAKE 1 TABLET BY MOUTH AT BEDTIME   metroNIDAZOLE  (METROCREAM ) 0.75 % cream Apply topically 2 (two) times daily.   sertraline  (ZOLOFT ) 100 MG tablet Take 1 tablet (100 mg total) by mouth daily.   VENTOLIN  HFA 108 (90 Base) MCG/ACT inhaler Inhale 2 puffs into the lungs every 4 (four) hours as needed for wheezing or shortness of breath.   aspirin  EC 81 MG tablet Take 1 tablet (81 mg total) by mouth daily. Swallow whole. (Patient not taking: Reported on 12/11/2024)   budesonide-formoterol (SYMBICORT ) 160-4.5 MCG/ACT inhaler Inhale 2 puffs into the lungs in the morning and at bedtime. (Patient taking differently: Inhale 2 puffs into the lungs as needed.)   ezetimibe  (ZETIA ) 10 MG tablet TAKE 1 TABLET BY MOUTH EVERY DAY (Patient not taking: Reported on 12/11/2024)   famotidine (PEPCID) 40 MG tablet Take 40 mg by mouth daily. (Patient not taking: Reported on 12/11/2024)   omeprazole  (PRILOSEC) 20 MG capsule TAKE 1 CAPSULE BY MOUTH EVERY DAY (Patient not taking: Reported on 12/11/2024)   pantoprazole  (PROTONIX ) 40 MG tablet Take 40 mg by mouth. (Patient not taking: Reported on 12/11/2024)   Tiotropium Bromide Monohydrate  (SPIRIVA  RESPIMAT) 1.25 MCG/ACT AERS Inhale 2 puffs into the lungs daily. (Patient taking differently: Inhale 2 puffs into the lungs as needed.)   No facility-administered encounter medications on file as of 12/11/2024.    Past Medical History:  Diagnosis Date   Allergy    Anxiety 01-2019   Arthritis    Asthma    no current problems, does not use inhaler   Depression    Some wirg covid lock down   Diabetes mellitus without complication (HCC)    type 2   Essential hypertension    Hyperlipidemia    Stroke [facility])     Past Surgical History:  Procedure Laterality Date   APPENDECTOMY  1985   exploratory laparoscopy     WISDOM TOOTH EXTRACTION      Family History  Problem Relation Age of Onset   Memory loss Mother    Breast cancer Mother 65    Cancer Mother    Stroke Father    Basal cell carcinoma Father    Squamous cell carcinoma Father    Melanoma Sister    Breast cancer Paternal Grandmother 62 - 89   Breast cancer Maternal Aunt 74   Cancer Maternal Aunt    Colon cancer Neg Hx    Rectal cancer Neg Hx    Stomach cancer Neg Hx    Esophageal cancer Neg Hx     Social History   Socioeconomic History   Marital status: Married    Spouse name: Ozell   Number of children: 2   Years of education: 16   Highest education level: Bachelor's degree (e.g., BA, AB, BS)  Occupational History   Occupation: Self Employed  Tobacco Use   Smoking status: Never    Passive exposure: Never   Smokeless tobacco: Never  Vaping Use   Vaping status: Never Used  Substance and Sexual Activity   Alcohol use: Yes    Alcohol/week: 0.0 standard drinks of alcohol    Comment: occasional wine   Drug use: No   Sexual activity: Yes    Birth control/protection: Post-menopausal  Other Topics Concern   Not on file  Social History Narrative   Lives with husband and 2 kids   Caffeine use: Drinks 2-3 cups coffee per day   Social Drivers of Health   Tobacco Use: Low Risk (12/07/2024)   Received from [facility]   Patient History    Smoking Tobacco Use: Never    Smokeless Tobacco Use: Never    Passive Exposure: Never  Financial Resource Strain: Medium Risk (12/10/2024)   Overall Financial Resource Strain (CARDIA)    Difficulty of Paying Living Expenses: Somewhat hard  Food Insecurity: No Food Insecurity (12/10/2024)   Epic    Worried About Programme Researcher, Broadcasting/film/video in the Last Year: Never true    Ran Out of Food in the Last Year: Never true  Transportation Needs: No Transportation Needs (12/10/2024)   Epic    Lack of Transportation (Medical): No    Lack of Transportation (Non-Medical): No  Physical Activity: Inactive (12/10/2024)   Exercise Vital Sign    Days of Exercise per Week: 0 days    Minutes of Exercise per Session: Not on file  Stress:  Stress Concern Present (12/10/2024)   [employer] of Occupational Health - Occupational Stress Questionnaire    Feeling of Stress: To some extent  Social Connections: Moderately Integrated (12/10/2024)   Social Connection and Isolation Panel    Frequency of Communication with Friends and Family: Twice a week    Frequency of Social Gatherings with Friends and Family: Once a week    Attends Religious Services: 1 to 4 times per year    Active Member of Clubs or Organizations: No  Attends Banker Meetings: Not on file    Marital Status: Married  Intimate Partner Violence: Not on file  Depression [phone]): Low Risk (04/17/2024)   Depression (PHQ2-9)    PHQ-2 Score: 0  Alcohol Screen: Low Risk (12/10/2024)   Alcohol Screen    Last Alcohol Screening Score (AUDIT): 1  Housing: High Risk (12/10/2024)   Epic    Unable to Pay for Housing in the Last Year: Yes    Number of Times Moved in the Last Year: Not on file    Homeless in the Last Year: No  Utilities: Not At Risk (09/17/2023)   AHC Utilities    Threatened with loss of utilities: No  Health Literacy: Adequate Health Literacy (09/17/2023)   B1300 Health Literacy    Frequency of need for help with medical instructions: Never    Review of Systems  All other systems reviewed and are negative.       Objective    BP 123/84   Pulse 78   Ht 5' 5 (1.651 m)   Wt 226 lb 6.4 oz (102.7 kg)   LMP 12/10/2015   SpO2 94%   BMI 37.67 kg/m   Physical Exam Vitals and nursing note reviewed.  Constitutional:      General: She is not in acute distress.    Appearance: She is obese.  Cardiovascular:     Rate and Rhythm: Normal rate and regular rhythm.  Pulmonary:     Effort: Pulmonary effort is normal.     Breath sounds: Normal breath sounds.  Abdominal:     Palpations: Abdomen is soft.     Tenderness: There is no abdominal tenderness.  Neurological:     General: No focal deficit present.     Mental Status: She is  alert and oriented to person, place, and time.         Assessment & Plan:   1. Type 2 diabetes mellitus with other specified complication, with long-term current use of insulin  (HCC) (Primary) Slightly increased A1c and not at goal. Discussed compliance.  Continue  - POCT glycosylated hemoglobin (Hb A1C)  2. Hypertension, essential Appears stable. Continue   3. Mixed hyperlipidemia Continue   4. Anxiety and depression Appears stable. Continue   5. Class 2 severe obesity due to excess calories with serious comorbidity and body mass index (BMI) of 37.0 to 37.9 in adult   6. History of cholecystectomy     Return in about 3 months (around 03/11/2025) for follow up, chronic med issues.   Tanda Raguel SQUIBB, MD  "

## 2025-01-10 ENCOUNTER — Ambulatory Visit: Admitting: Endocrinology

## 2025-01-18 ENCOUNTER — Ambulatory Visit: Admitting: Endocrinology

## 2025-02-05 ENCOUNTER — Ambulatory Visit: Admitting: Adult Health

## 2025-03-12 ENCOUNTER — Ambulatory Visit: Payer: Self-pay | Admitting: Family Medicine

## 2025-03-19 ENCOUNTER — Ambulatory Visit: Admitting: Pulmonary Disease

## 2025-09-12 ENCOUNTER — Ambulatory Visit: Admitting: Dermatology
